# Patient Record
Sex: Male | Born: 1950 | Race: Black or African American | Hispanic: No | Marital: Married | Smoking: Former smoker
Health system: Southern US, Community
[De-identification: ages and names within clinical notes are randomized; demographics above are authoritative.]

## PROBLEM LIST (undated history)

## (undated) DIAGNOSIS — M1990 Unspecified osteoarthritis, unspecified site: Secondary | ICD-10-CM

## (undated) DIAGNOSIS — R200 Anesthesia of skin: Secondary | ICD-10-CM

## (undated) DIAGNOSIS — R32 Unspecified urinary incontinence: Secondary | ICD-10-CM

## (undated) DIAGNOSIS — Z9289 Personal history of other medical treatment: Secondary | ICD-10-CM

## (undated) DIAGNOSIS — E785 Hyperlipidemia, unspecified: Secondary | ICD-10-CM

## (undated) DIAGNOSIS — K219 Gastro-esophageal reflux disease without esophagitis: Secondary | ICD-10-CM

## (undated) DIAGNOSIS — I83009 Varicose veins of unspecified lower extremity with ulcer of unspecified site: Secondary | ICD-10-CM

## (undated) DIAGNOSIS — I872 Venous insufficiency (chronic) (peripheral): Secondary | ICD-10-CM

## (undated) DIAGNOSIS — E876 Hypokalemia: Secondary | ICD-10-CM

## (undated) DIAGNOSIS — K5900 Constipation, unspecified: Secondary | ICD-10-CM

## (undated) DIAGNOSIS — I739 Peripheral vascular disease, unspecified: Secondary | ICD-10-CM

## (undated) DIAGNOSIS — G8929 Other chronic pain: Secondary | ICD-10-CM

## (undated) DIAGNOSIS — G4730 Sleep apnea, unspecified: Secondary | ICD-10-CM

## (undated) DIAGNOSIS — I509 Heart failure, unspecified: Secondary | ICD-10-CM

## (undated) DIAGNOSIS — E1151 Type 2 diabetes mellitus with diabetic peripheral angiopathy without gangrene: Secondary | ICD-10-CM

## (undated) DIAGNOSIS — M1712 Unilateral primary osteoarthritis, left knee: Secondary | ICD-10-CM

## (undated) DIAGNOSIS — I10 Essential (primary) hypertension: Secondary | ICD-10-CM

## (undated) DIAGNOSIS — L97909 Non-pressure chronic ulcer of unspecified part of unspecified lower leg with unspecified severity: Secondary | ICD-10-CM

## (undated) DIAGNOSIS — I5032 Chronic diastolic (congestive) heart failure: Secondary | ICD-10-CM

## (undated) DIAGNOSIS — M5440 Lumbago with sciatica, unspecified side: Secondary | ICD-10-CM

## (undated) DIAGNOSIS — D509 Iron deficiency anemia, unspecified: Secondary | ICD-10-CM

## (undated) DIAGNOSIS — I350 Nonrheumatic aortic (valve) stenosis: Secondary | ICD-10-CM

## (undated) DIAGNOSIS — G4733 Obstructive sleep apnea (adult) (pediatric): Secondary | ICD-10-CM

## (undated) DIAGNOSIS — R202 Paresthesia of skin: Secondary | ICD-10-CM

## (undated) HISTORY — PX: TONSILLECTOMY: SUR1361

## (undated) HISTORY — DX: Non-pressure chronic ulcer of unspecified part of unspecified lower leg with unspecified severity: I83.009

## (undated) HISTORY — DX: Chronic diastolic (congestive) heart failure: I50.32

## (undated) HISTORY — DX: Type 2 diabetes mellitus with diabetic peripheral angiopathy without gangrene: E11.51

## (undated) HISTORY — DX: Constipation, unspecified: K59.00

## (undated) HISTORY — DX: Peripheral vascular disease, unspecified: I73.9

## (undated) HISTORY — DX: Hypokalemia: E87.6

## (undated) HISTORY — DX: Other chronic pain: G89.29

## (undated) HISTORY — DX: Hyperlipidemia, unspecified: E78.5

## (undated) HISTORY — DX: Lumbago with sciatica, unspecified side: M54.40

## (undated) HISTORY — DX: Gastro-esophageal reflux disease without esophagitis: K21.9

## (undated) HISTORY — DX: Venous insufficiency (chronic) (peripheral): I87.2

## (undated) HISTORY — DX: Varicose veins of unspecified lower extremity with ulcer of unspecified site: L97.909

## (undated) HISTORY — DX: Obstructive sleep apnea (adult) (pediatric): G47.33

## (undated) HISTORY — DX: Unilateral primary osteoarthritis, left knee: M17.12

## (undated) HISTORY — PX: VARICOSE VEIN SURGERY: SHX832

## (undated) HISTORY — PX: COLON SURGERY: SHX602

---

## 1997-11-03 ENCOUNTER — Ambulatory Visit (HOSPITAL_COMMUNITY): Admission: RE | Admit: 1997-11-03 | Discharge: 1997-11-03 | Payer: Self-pay | Admitting: Physician Assistant

## 1998-01-25 ENCOUNTER — Ambulatory Visit: Admission: RE | Admit: 1998-01-25 | Discharge: 1998-01-25 | Payer: Self-pay | Admitting: Family Medicine

## 1998-11-22 ENCOUNTER — Ambulatory Visit: Admission: RE | Admit: 1998-11-22 | Discharge: 1998-11-22 | Payer: Self-pay | Admitting: Family Medicine

## 2000-03-14 ENCOUNTER — Ambulatory Visit (HOSPITAL_BASED_OUTPATIENT_CLINIC_OR_DEPARTMENT_OTHER): Admission: RE | Admit: 2000-03-14 | Discharge: 2000-03-14 | Payer: Self-pay | Admitting: Pulmonary Disease

## 2001-03-06 ENCOUNTER — Ambulatory Visit (HOSPITAL_BASED_OUTPATIENT_CLINIC_OR_DEPARTMENT_OTHER): Admission: RE | Admit: 2001-03-06 | Discharge: 2001-03-06 | Payer: Self-pay | Admitting: Pulmonary Disease

## 2001-12-27 ENCOUNTER — Ambulatory Visit (HOSPITAL_COMMUNITY): Admission: RE | Admit: 2001-12-27 | Discharge: 2001-12-27 | Payer: Self-pay | Admitting: Orthopedic Surgery

## 2001-12-27 ENCOUNTER — Encounter: Payer: Self-pay | Admitting: Orthopedic Surgery

## 2002-12-07 ENCOUNTER — Ambulatory Visit (HOSPITAL_COMMUNITY): Admission: RE | Admit: 2002-12-07 | Discharge: 2002-12-07 | Payer: Self-pay | Admitting: Gastroenterology

## 2003-01-31 ENCOUNTER — Encounter: Admission: RE | Admit: 2003-01-31 | Discharge: 2003-05-01 | Payer: Self-pay | Admitting: Family Medicine

## 2004-04-15 ENCOUNTER — Encounter: Admission: RE | Admit: 2004-04-15 | Discharge: 2004-04-15 | Payer: Self-pay | Admitting: Orthopedic Surgery

## 2004-05-01 ENCOUNTER — Encounter: Admission: RE | Admit: 2004-05-01 | Discharge: 2004-05-01 | Payer: Self-pay | Admitting: Orthopedic Surgery

## 2004-05-27 ENCOUNTER — Encounter: Admission: RE | Admit: 2004-05-27 | Discharge: 2004-05-27 | Payer: Self-pay | Admitting: Orthopedic Surgery

## 2004-05-27 ENCOUNTER — Emergency Department (HOSPITAL_COMMUNITY): Admission: EM | Admit: 2004-05-27 | Discharge: 2004-05-27 | Payer: Self-pay | Admitting: Emergency Medicine

## 2005-05-18 HISTORY — PX: COLON RESECTION: SHX5231

## 2005-07-02 HISTORY — PX: TRANSTHORACIC ECHOCARDIOGRAM: SHX275

## 2005-07-05 ENCOUNTER — Inpatient Hospital Stay (HOSPITAL_COMMUNITY): Admission: EM | Admit: 2005-07-05 | Discharge: 2005-07-15 | Payer: Self-pay | Admitting: Emergency Medicine

## 2005-09-10 ENCOUNTER — Encounter (HOSPITAL_BASED_OUTPATIENT_CLINIC_OR_DEPARTMENT_OTHER): Admission: RE | Admit: 2005-09-10 | Discharge: 2005-10-06 | Payer: Self-pay | Admitting: Surgery

## 2006-02-12 ENCOUNTER — Encounter: Admission: RE | Admit: 2006-02-12 | Discharge: 2006-02-12 | Payer: Self-pay | Admitting: Internal Medicine

## 2006-02-19 ENCOUNTER — Encounter: Admission: RE | Admit: 2006-02-19 | Discharge: 2006-02-19 | Payer: Self-pay | Admitting: Internal Medicine

## 2006-03-17 ENCOUNTER — Ambulatory Visit: Payer: Self-pay | Admitting: Gastroenterology

## 2006-03-23 ENCOUNTER — Ambulatory Visit: Payer: Self-pay | Admitting: Gastroenterology

## 2006-05-03 ENCOUNTER — Inpatient Hospital Stay (HOSPITAL_COMMUNITY): Admission: RE | Admit: 2006-05-03 | Discharge: 2006-05-10 | Payer: Self-pay | Admitting: General Surgery

## 2006-05-13 ENCOUNTER — Ambulatory Visit (HOSPITAL_COMMUNITY): Admission: RE | Admit: 2006-05-13 | Discharge: 2006-05-13 | Payer: Self-pay | Admitting: General Surgery

## 2012-01-14 ENCOUNTER — Ambulatory Visit
Admission: RE | Admit: 2012-01-14 | Discharge: 2012-01-14 | Disposition: A | Discharge status: Home / Self Care | Payer: BC Managed Care – PPO | Source: Ambulatory Visit | Attending: Family Medicine | Admitting: Family Medicine

## 2012-01-14 ENCOUNTER — Other Ambulatory Visit: Payer: Self-pay | Admitting: Family Medicine

## 2012-01-14 DIAGNOSIS — M549 Dorsalgia, unspecified: Secondary | ICD-10-CM

## 2012-01-14 MED ORDER — IOHEXOL 180 MG/ML  SOLN: 1.0000 mL | Freq: Once | INTRAMUSCULAR | Status: AC | PRN

## 2012-01-14 MED ORDER — METHYLPREDNISOLONE ACETATE 40 MG/ML INJ SUSP (RADIOLOG: 120.0000 mg | Freq: Once | INTRAMUSCULAR | Status: DC

## 2012-01-19 ENCOUNTER — Other Ambulatory Visit: Payer: Self-pay | Admitting: Family Medicine

## 2012-01-19 DIAGNOSIS — R531 Weakness: Secondary | ICD-10-CM

## 2012-01-19 DIAGNOSIS — M545 Low back pain: Secondary | ICD-10-CM

## 2012-01-19 DIAGNOSIS — R200 Anesthesia of skin: Secondary | ICD-10-CM

## 2012-01-20 ENCOUNTER — Encounter (HOSPITAL_COMMUNITY): Payer: Self-pay

## 2012-01-20 ENCOUNTER — Emergency Department (HOSPITAL_COMMUNITY): Payer: BC Managed Care – PPO

## 2012-01-20 ENCOUNTER — Emergency Department (HOSPITAL_COMMUNITY)
Admission: EM | Admit: 2012-01-20 | Discharge: 2012-01-21 | Disposition: A | Discharge status: Home / Self Care | Payer: BC Managed Care – PPO | Attending: Emergency Medicine | Admitting: Emergency Medicine

## 2012-01-20 ENCOUNTER — Other Ambulatory Visit: Payer: Self-pay | Admitting: Family Medicine

## 2012-01-20 DIAGNOSIS — I10 Essential (primary) hypertension: Secondary | ICD-10-CM | POA: Insufficient documentation

## 2012-01-20 DIAGNOSIS — R531 Weakness: Secondary | ICD-10-CM

## 2012-01-20 DIAGNOSIS — M545 Low back pain, unspecified: Secondary | ICD-10-CM | POA: Insufficient documentation

## 2012-01-20 DIAGNOSIS — M79609 Pain in unspecified limb: Secondary | ICD-10-CM | POA: Insufficient documentation

## 2012-01-20 DIAGNOSIS — E119 Type 2 diabetes mellitus without complications: Secondary | ICD-10-CM | POA: Insufficient documentation

## 2012-01-20 DIAGNOSIS — M4800 Spinal stenosis, site unspecified: Secondary | ICD-10-CM | POA: Insufficient documentation

## 2012-01-20 DIAGNOSIS — R200 Anesthesia of skin: Secondary | ICD-10-CM

## 2012-01-20 DIAGNOSIS — R209 Unspecified disturbances of skin sensation: Secondary | ICD-10-CM | POA: Insufficient documentation

## 2012-01-20 HISTORY — DX: Essential (primary) hypertension: I10

## 2012-01-20 MED ORDER — HYDROMORPHONE HCL PF 1 MG/ML IJ SOLN
1.0000 mg | INTRAMUSCULAR | Status: AC | PRN
  Administered 2012-01-20 – 2012-01-21 (×3): 1 mg via INTRAVENOUS
  Filled 2012-01-20 (×3): qty 1

## 2012-01-20 MED ORDER — ONDANSETRON HCL 4 MG/2ML IJ SOLN
4.0000 mg | Freq: Once | INTRAMUSCULAR | Status: AC | PRN
  Administered 2012-01-20: 4 mg via INTRAVENOUS
  Filled 2012-01-20: qty 2

## 2012-01-20 MED ORDER — HYDROMORPHONE HCL PF 1 MG/ML IJ SOLN: 1.0000 mg | Freq: Once | INTRAMUSCULAR | Status: DC

## 2012-01-20 MED ORDER — SODIUM CHLORIDE 0.9 % IV SOLN
INTRAVENOUS | Status: DC
  Administered 2012-01-20: 16:00:00 via INTRAVENOUS

## 2012-01-20 NOTE — ED Provider Notes (Signed)
History   This chart was scribed for Celene Kras, MD by Charolett Bumpers . The patient was seen in room TR07C/TR07C. Patient's care was started at 1454.     CSN: [id number]  Arrival date & time 01/20/12  1330   First MD Initiated Contact with Patient 01/20/12 1454      Chief Complaint  Patient presents with  . Back Pain    (Consider location/radiation/quality/duration/timing/severity/associated sxs/prior treatment) HPI Mario Mills is a 61 y.o. male who presents to the Emergency Department complaining of constant, moderate lower back with radiation into left thigh down to knee for the past 10 days. Pt reports that he has a h/o back problems approximately 8 years ago but reports the current back pain is new. Pt states that he was seen by chiropractor several times and PCP last week. Pt reports that he followed up with [facility] orthopedics where an MRI was order but has not been preformed.   Past Medical History  Diagnosis Date  . Hypertension   . Diabetes mellitus   . High cholesterol     No past surgical history on file.  No family history on file.  History  Substance Use Topics  . Smoking status: Not on file  . Smokeless tobacco: Not on file  . Alcohol Use: No      Review of Systems  Constitutional: Negative for fever and chills.  Respiratory: Negative for shortness of breath.   Gastrointestinal: Negative for nausea and vomiting.  Musculoskeletal: Positive for back pain.  Neurological: Negative for weakness.  All other systems reviewed and are negative.    Allergies  Review of patient's allergies indicates no known allergies.  Home Medications   Current Outpatient Rx  Name Route Sig Dispense Refill  . CARVEDILOL 12.5 MG PO TABS Oral Take 12.5 mg by mouth 2 (two) times daily with a meal.    . CINNAMON PO Oral Take 1 tablet by mouth 2 (two) times daily.    [name] DIAZEPAM 10 MG PO TABS Oral Take 10 mg by mouth daily as needed. pain    .  DICLOFENAC-MISOPROSTOL 75-0.2 MG PO TBEC Oral Take 1 tablet by mouth daily.    . FUROSEMIDE 40 MG PO TABS Oral Take 40 mg by mouth 2 (two) times daily as needed. For fluid retention    . GLIMEPIRIDE 4 MG PO TABS Oral Take 4 mg by mouth daily before breakfast.    . HYDROMORPHONE HCL 2 MG PO TABS Oral Take 2 mg by mouth every 4 (four) hours as needed. For pain    . LISINOPRIL-HYDROCHLOROTHIAZIDE 20-12.5 MG PO TABS Oral Take 1 tablet by mouth daily.    [name] METFORMIN HCL 1000 MG PO TABS Oral Take 1,000 mg by mouth 2 (two) times daily with a meal.    . ADULT MULTIVITAMIN W/MINERALS CH Oral Take 1 tablet by mouth daily.    [name] FISH OIL PO Oral Take 1 capsule by mouth daily.    [name] POTASSIUM CHLORIDE CRYS ER 20 MEQ PO TBCR Oral Take 20 mEq by mouth 2 (two) times daily as needed. With lasix    . TIZANIDINE HCL 4 MG PO TABS Oral Take 4 mg by mouth every 6 (six) hours as needed. For muscle spasms      BP 190/81  Pulse 92  Temp 97.4 F (36.3 C) (Oral)  Resp 18  SpO2 98%  Physical Exam  Nursing note and vitals reviewed. Constitutional: He appears well-developed and well-nourished. No distress.  HENT:  Head: Normocephalic and atraumatic.  Right Ear: External ear normal.  Left Ear: External ear normal.  Eyes: Conjunctivae are normal. Right eye exhibits no discharge. Left eye exhibits no discharge. No scleral icterus.  Neck: Neck supple. No tracheal deviation present.  Cardiovascular: Normal rate.   Pulmonary/Chest: Effort normal. No stridor. No respiratory distress.  Abdominal: Soft. He exhibits no distension. There is no tenderness.  Musculoskeletal: He exhibits tenderness. He exhibits no edema.       Perispinal lumbar tenderness. Slightly altered plantar/dorsi flexion on the left as compared to the right. Subjective altered sensation of the left leg as compared to the right.   Neurological: He is alert. Cranial nerve deficit: no gross deficits.  Skin: Skin is warm and dry. No rash noted.    Psychiatric: He has a normal mood and affect.    ED Course  Procedures (including critical care time)  DIAGNOSTIC STUDIES: Oxygen Saturation is 98% on room air, normal by my interpretation.    COORDINATION OF CARE:  15:08-Discussed planned course of treatment with the patient including pain medication, who is agreeable at this time. MRI has been previously order, will contact radiology to see what needs to be done.   15:14-Medication Orders: Hydromorphone (Dilaudid) injection 1 mg-every 30 min PRN; Ondansetron (Zofran) injection 4 mg-once; 0.9% sodium chloride infusion-continuous.   Labs Reviewed - No data to display No results found.   No diagnosis found.    MDM  Pt's symptoms suggestive of radiculopathy.  His orthopedist wanted him to get an MRI.  Pt does not think he can wait.  Will order MRI while here in the ED  I personally performed the services described in this documentation, which was scribed in my presence.  The recorded information has been reviewed and considered.        Celene Kras, MD 01/20/12 2012

## 2012-01-20 NOTE — ED Notes (Signed)
Call to MRI, told patient on the list

## 2012-01-20 NOTE — ED Notes (Signed)
Patient transported to MRI 

## 2012-01-20 NOTE — ED Notes (Signed)
Patient returned from MRI.

## 2012-01-20 NOTE — ED Notes (Signed)
Back apin for approx 10 days, sts 2 sundays ago, onset after mowing lawn, pt reports seeing md for same and given epidural, diaudid, demerol, and mulitple treatments, none of which are working, pt sts we need an mri next.

## 2012-01-20 NOTE — ED Notes (Signed)
Pt given crackers and a diet sprite

## 2012-01-20 NOTE — ED Notes (Addendum)
Pt reports lower back pain that shoot down left leg to knee for 10 days.  Pt states pain started after mowing lawn.  Pt reports pain is eased temporarily by lying on left side.  Pt has seen md for same and has been taking dilaudid and muscle relax as rx without relief.  Pt states he was told he needed MRIPt alert oriented X4.

## 2012-01-20 NOTE — ED Provider Notes (Signed)
Patient is a 61 year old with a history of hypertension diabetes that presents emergency department complaining of left-sided radicular back pain.  Pain currently being managed in the emergency department with Dilaudid. Pt re-evaluated & on exam: hemodynamically stable, NAD, heart w/ RRR, lungs CTAB, Chest & abd non-tender, No weakness or focal neuro deficits on lower extremity exam.  MRI reviewed and listed below showing severe spinal stenosis at L3-4, L4-5.  Neurosurgery has been consult made to discuss outpatient followup scheduling.  Patient will be discharged with pain medication & advice to call Dr. Cassandria Santee office today to be seen.  MRI IMPRESSION 01/20/2012: Multilevel lumbar disc and facet degeneration. Moderate to severe spinal stenosis at L1-2 and L2-3. Severe spinal stenosis L3-4 and L4-5.  Left lateral disc protrusion L2-3.  Left small paracentral disc protrusion L5-S1 which could cause a left S1 radiculopathy.      Jaci Carrel, [place] 01/21/12 [phone]

## 2012-01-21 ENCOUNTER — Inpatient Hospital Stay
Admission: RE | Admit: 2012-01-21 | Discharge: 2012-01-21 | Payer: BC Managed Care – PPO | Source: Ambulatory Visit | Attending: Family Medicine | Admitting: Family Medicine

## 2012-01-21 ENCOUNTER — Other Ambulatory Visit: Payer: BC Managed Care – PPO

## 2012-01-21 MED ORDER — HYDROMORPHONE HCL 2 MG PO TABS: 2.0000 mg | ORAL_TABLET | ORAL | Status: AC | PRN

## 2012-01-21 NOTE — ED Provider Notes (Signed)
Medical screening examination/treatment/procedure(s) were performed by non-physician practitioner and as supervising physician I was immediately available for consultation/collaboration.  Jones Skene, M.D.     Jones Skene, MD 01/21/12 1243

## 2012-07-25 ENCOUNTER — Other Ambulatory Visit (HOSPITAL_COMMUNITY): Payer: Self-pay | Admitting: Cardiovascular Disease

## 2012-07-25 DIAGNOSIS — R0989 Other specified symptoms and signs involving the circulatory and respiratory systems: Secondary | ICD-10-CM

## 2012-08-01 ENCOUNTER — Encounter (HOSPITAL_COMMUNITY): Payer: BC Managed Care – PPO

## 2012-08-03 ENCOUNTER — Ambulatory Visit (HOSPITAL_COMMUNITY)
Admission: RE | Admit: 2012-08-03 | Discharge: 2012-08-03 | Disposition: A | Discharge status: Home / Self Care | Payer: No Typology Code available for payment source | Source: Ambulatory Visit | Attending: Cardiovascular Disease | Admitting: Cardiovascular Disease

## 2012-08-03 DIAGNOSIS — R0989 Other specified symptoms and signs involving the circulatory and respiratory systems: Secondary | ICD-10-CM | POA: Insufficient documentation

## 2012-08-03 NOTE — Progress Notes (Signed)
Carotid Duplex Complete Mario Mills 

## 2012-12-09 ENCOUNTER — Encounter (HOSPITAL_BASED_OUTPATIENT_CLINIC_OR_DEPARTMENT_OTHER): Payer: No Typology Code available for payment source | Attending: General Surgery

## 2012-12-09 DIAGNOSIS — E669 Obesity, unspecified: Secondary | ICD-10-CM | POA: Insufficient documentation

## 2012-12-09 DIAGNOSIS — I87319 Chronic venous hypertension (idiopathic) with ulcer of unspecified lower extremity: Secondary | ICD-10-CM | POA: Insufficient documentation

## 2012-12-09 DIAGNOSIS — E119 Type 2 diabetes mellitus without complications: Secondary | ICD-10-CM | POA: Insufficient documentation

## 2012-12-09 DIAGNOSIS — I10 Essential (primary) hypertension: Secondary | ICD-10-CM | POA: Insufficient documentation

## 2012-12-09 DIAGNOSIS — Z79899 Other long term (current) drug therapy: Secondary | ICD-10-CM | POA: Insufficient documentation

## 2012-12-09 DIAGNOSIS — L97909 Non-pressure chronic ulcer of unspecified part of unspecified lower leg with unspecified severity: Secondary | ICD-10-CM | POA: Insufficient documentation

## 2012-12-09 DIAGNOSIS — I872 Venous insufficiency (chronic) (peripheral): Secondary | ICD-10-CM | POA: Insufficient documentation

## 2012-12-09 NOTE — Progress Notes (Signed)
[facility]  NAME:  Mario Mills, Mario Mills             ACCOUNT NO.:  [id number]  MEDICAL RECORD NO.:  [id number]      DATE OF BIRTH:  05-09-1951  PHYSICIAN:  Ardath Sax, M.D.      VISIT DATE:  12/09/2012                                  OFFICE VISIT   SUBJECTIVE:  This is a 62 year old, African American male, who is a type 2 diabetic. who will also has hypertension.  He comes to [place] with venostasis ulcers on both of his legs.  Both of his legs have marked changes of venous hypertension, and he has chronic venous ulcers on both of his legs.  He has got secondary skin changes.  He has already had a saphenous vein stripping on the right in the past.  He weighs 330 pounds.  His temperature 98, pulse 62, respiration 16, blood pressure 178/71.  He is on many medicines including metformin, lisinopril, carvedilol, K- Lor, Lasix, simvastatin, Dilaudid, glipizide, and Xanax.  OBJECTIVE:  On examination, his legs have secondary changes with the bronze coloring of venostasis.  He has got several 1 cm ulcers on the anterior and lateral aspect of both of his legs.  The skin is very edematous and tight.  ASSESSMENT/PLAN:  We got a 0.8-ABI, so we elected just to put Profore Lite with Silvercel and zinc, and we ordered vascular studies with Dr. Allyson Sabal.  So, his diagnosis is venous hypertension, chronic venous ulcers in bilateral legs, obesity, hypertension, and type 2 diabetes.  We will see him back here in a week after his vascular studies.     Ardath Sax, M.D.     PP/MEDQ  D:  12/09/2012  T:  12/09/2012  Job:  [id number]

## 2012-12-14 ENCOUNTER — Other Ambulatory Visit (HOSPITAL_COMMUNITY): Payer: Self-pay | Admitting: General Surgery

## 2012-12-14 DIAGNOSIS — I872 Venous insufficiency (chronic) (peripheral): Secondary | ICD-10-CM

## 2012-12-14 DIAGNOSIS — I739 Peripheral vascular disease, unspecified: Secondary | ICD-10-CM

## 2012-12-16 ENCOUNTER — Encounter (HOSPITAL_COMMUNITY): Payer: No Typology Code available for payment source

## 2012-12-16 ENCOUNTER — Ambulatory Visit (HOSPITAL_BASED_OUTPATIENT_CLINIC_OR_DEPARTMENT_OTHER)
Admission: RE | Admit: 2012-12-16 | Discharge: 2012-12-16 | Disposition: A | Discharge status: Home / Self Care | Payer: No Typology Code available for payment source | Source: Ambulatory Visit | Attending: General Surgery | Admitting: General Surgery

## 2012-12-16 ENCOUNTER — Ambulatory Visit (HOSPITAL_COMMUNITY)
Admission: RE | Admit: 2012-12-16 | Discharge: 2012-12-16 | Disposition: A | Discharge status: Home / Self Care | Payer: No Typology Code available for payment source | Source: Ambulatory Visit | Attending: General Surgery | Admitting: General Surgery

## 2012-12-16 ENCOUNTER — Encounter (HOSPITAL_BASED_OUTPATIENT_CLINIC_OR_DEPARTMENT_OTHER): Payer: No Typology Code available for payment source | Attending: General Surgery

## 2012-12-16 DIAGNOSIS — I872 Venous insufficiency (chronic) (peripheral): Secondary | ICD-10-CM

## 2012-12-16 DIAGNOSIS — I739 Peripheral vascular disease, unspecified: Secondary | ICD-10-CM

## 2012-12-16 DIAGNOSIS — L97809 Non-pressure chronic ulcer of other part of unspecified lower leg with unspecified severity: Secondary | ICD-10-CM | POA: Insufficient documentation

## 2012-12-16 NOTE — Progress Notes (Signed)
Lower Extremity Venous Duplex Completed. °Mario Mills ° °

## 2012-12-16 NOTE — Progress Notes (Signed)
Lower Extremity Arterial Duplex Completed. °Mario Mills ° °

## 2012-12-22 ENCOUNTER — Ambulatory Visit (INDEPENDENT_AMBULATORY_CARE_PROVIDER_SITE_OTHER): Payer: No Typology Code available for payment source | Admitting: Cardiovascular Disease

## 2012-12-22 ENCOUNTER — Encounter: Payer: Self-pay | Admitting: Cardiovascular Disease

## 2012-12-22 VITALS — BP 164/82 | HR 78 | Ht 73.0 in | Wt 334.0 lb

## 2012-12-22 DIAGNOSIS — I10 Essential (primary) hypertension: Secondary | ICD-10-CM

## 2012-12-22 DIAGNOSIS — G4733 Obstructive sleep apnea (adult) (pediatric): Secondary | ICD-10-CM | POA: Insufficient documentation

## 2012-12-22 DIAGNOSIS — E119 Type 2 diabetes mellitus without complications: Secondary | ICD-10-CM

## 2012-12-22 DIAGNOSIS — I739 Peripheral vascular disease, unspecified: Secondary | ICD-10-CM

## 2012-12-22 DIAGNOSIS — I872 Venous insufficiency (chronic) (peripheral): Secondary | ICD-10-CM

## 2012-12-22 DIAGNOSIS — I878 Other specified disorders of veins: Secondary | ICD-10-CM

## 2012-12-22 DIAGNOSIS — E785 Hyperlipidemia, unspecified: Secondary | ICD-10-CM

## 2012-12-22 NOTE — Assessment & Plan Note (Signed)
Lower extremity arterial Dopplers revealed a left ABI 0.95 the left of 0.73. He did have a high-frequency signal in the origin of the left SFA.it is hard to elicit symptoms of claudication since he says he has discomfort in his legs bilaterally.. I will follow him up again in 6 months

## 2012-12-22 NOTE — Assessment & Plan Note (Signed)
Patient has had vein stripping on the right with a long history of venous stasis bilaterally. He's had MRSA on his right leg. He is a poorly healing wound on his left pretibial area followed by the wound care center (Dr. Ardath Sax). He's had this wrapped and has had compression stockings in the past. Venous Dopplers performed in our office to confirm this reflux. I am going to refer him to Dr. [place] [name] for further evaluation and potential endovascular ablation

## 2012-12-22 NOTE — Patient Instructions (Addendum)
You will be set up to see Dr Rennis Golden next week  Follow up with Dr Allyson Sabal in 6 months

## 2012-12-22 NOTE — Progress Notes (Signed)
12/22/2012 Mario Mills   Mar 08, 1951  [id number]  Primary Physician Mario German, MD Primary Cardiologist: Mario Gess MD Mario Mills   HPI:  The patient is a 62 year old, severely overweight, married [place] American male with a history of hypertension, hyperlipidemia, noninsulin-requiring diabetes and obstructive sleep apnea on CPAP. He stopped smoking 7 years ago. I saw him last March 04, 2007. He successfully underwent colon resection after a normal 2D echo and Cardiolite stress test at that time. He is otherwise asymptomatic except for spinal stenosis. Recent lab work performed by Dr. Concepcion Mills revealed a total cholesterol of 256, LDL of 190 and HDL of 37.I saw him in the office 5 months ago. He's been stable since except for a poorly healing left pretibial venous stasis ulcer thought that Dr. Ardath Mills at the [facility] wound care clinic. Arterial and venous Dopplers were obtained our office on 12/16/12 revealing a right ankle regular neck supple 95 and a left of 0.73 with a high-frequency signal in his left SFA though it is hard to elicit a clear history of claudication. Venous Dopplers to suggest venous reflux on the left. He's had venous stripping on the right with MRSA    Current Outpatient Prescriptions  Medication Sig Dispense Refill  . carvedilol (COREG) 12.5 MG tablet Take 12.5 mg by mouth 2 (two) times daily with a meal.      . CINNAMON PO Take 1 tablet by mouth 2 (two) times daily. 1-2 times a day      . diazepam (VALIUM) 10 MG tablet Take 10 mg by mouth daily as needed. pain      . Diclofenac-Misoprostol 75-0.2 MG TBEC Take 1 tablet by mouth 2 (two) times daily.       [name] DILTIAZEM HCL CD 360 MG 24 hr capsule 360 mg daily.      . furosemide (LASIX) 40 MG tablet Take 40 mg by mouth daily. AS NEEDED For fluid retention      . glimepiride (AMARYL) 4 MG tablet Take 4 mg by mouth daily before breakfast.      . HYDROmorphone (DILAUDID) 2 MG tablet Take 2 mg by  mouth every 4 (four) hours as needed. For pain      . LANTUS SOLOSTAR 100 UNIT/ML SOPN daily.      [name] lisinopril-hydrochlorothiazide (PRINZIDE,ZESTORETIC) 20-12.5 MG per tablet Take 1 tablet by mouth daily.      . metFORMIN (GLUCOPHAGE) 1000 MG tablet Take 1,000 mg by mouth 2 (two) times daily with a meal.      . Multiple Vitamin (MULTIVITAMIN WITH MINERALS) TABS Take 1 tablet by mouth daily.      . Omega-3 Fatty Acids (FISH OIL PO) Take 1 capsule by mouth daily.      . potassium chloride SA (K-DUR,KLOR-CON) 20 MEQ tablet Take 20 mEq by mouth 2 (two) times daily as needed. With lasix      . simvastatin (ZOCOR) 40 MG tablet 40 mg daily.       No current facility-administered medications for this visit.    No Known Allergies  History   Social History  . Marital Status: Married    Spouse Name: N/A    Number of Children: N/A  . Years of Education: N/A   Occupational History  . Not on file.   Social History Main Topics  . Smoking status: Former Smoker -- 1.00 packs/day for 35 years    Types: Cigarettes  . Smokeless tobacco: Never Used  . Alcohol Use: 0.0  oz/week    0.5 drink(s) per week  . Drug Use: No  . Sexually Active: Not on file   Other Topics Concern  . Not on file   Social History Narrative  . No narrative on file     Review of Systems: General: negative for chills, fever, night sweats or weight changes.  Cardiovascular: negative for chest pain, dyspnea on exertion, edema, orthopnea, palpitations, paroxysmal nocturnal dyspnea or shortness of breath Dermatological: negative for rash Respiratory: negative for cough or wheezing Urologic: negative for hematuria Abdominal: negative for nausea, vomiting, diarrhea, bright red blood per rectum, melena, or hematemesis Neurologic: negative for visual changes, syncope, or dizziness All other systems reviewed and are otherwise negative except as noted above.    Blood pressure 164/82, pulse 78, height 6\' 1"  (1.854 m), weight  334 lb (151.501 kg).  General appearance: alert and no distress Neck: no adenopathy, no carotid bruit, no JVD, supple, symmetrical, trachea midline and thyroid not enlarged, symmetric, no tenderness/mass/nodules Lungs: clear to auscultation bilaterally Heart: regular rate and rhythm, S1, S2 normal, no murmur, click, rub or gallop Extremities: venous stasis dermatitis noted and poorly healing venostasis ulcer left pretibial area  EKG not performed today  ASSESSMENT AND PLAN:   Venous stasis Patient has had vein stripping on the right with a long history of venous stasis bilaterally. He's had MRSA on his right leg. He is a poorly healing wound on his left pretibial area followed by the wound care center (Dr. Ardath Mills). He's had this wrapped and has had compression stockings in the past. Venous Dopplers performed in our office to confirm this reflux. I am going to refer him to Dr. Italy Mills for further evaluation and potential endovascular ablation  Peripheral arterial disease Lower extremity arterial Dopplers revealed a left ABI 0.95 the left of 0.73. He did have a high-frequency signal in the origin of the left SFA.it is hard to elicit symptoms of claudication since he says he has discomfort in his legs bilaterally.. I will follow him up again in 6 months      Mario Gess MD [facility], [facility] 12/22/2012 12:42 PM

## 2012-12-24 ENCOUNTER — Encounter: Payer: Self-pay | Admitting: *Deleted

## 2012-12-26 ENCOUNTER — Encounter: Payer: Self-pay | Admitting: Cardiovascular Disease

## 2012-12-27 ENCOUNTER — Ambulatory Visit (INDEPENDENT_AMBULATORY_CARE_PROVIDER_SITE_OTHER): Payer: No Typology Code available for payment source | Admitting: Internal Medicine

## 2012-12-27 ENCOUNTER — Encounter: Payer: Self-pay | Admitting: Internal Medicine

## 2012-12-27 VITALS — BP 154/84 | HR 72 | Ht 73.0 in | Wt 335.3 lb

## 2012-12-27 DIAGNOSIS — I878 Other specified disorders of veins: Secondary | ICD-10-CM

## 2012-12-27 DIAGNOSIS — I872 Venous insufficiency (chronic) (peripheral): Secondary | ICD-10-CM

## 2012-12-27 NOTE — Progress Notes (Signed)
OFFICE NOTE  Chief Complaint:  Poorly healing wounds  Primary Care Physician: Mario Mills German, MD.   HPI:  Mario Mills Mills is a pleasant 62 year old morbidly obese African American male with a history of hypertension, dyslipidemia, known insulin-dependent diabetes and obstructive sleep apnea CPAP. He is a patient of Mario Mills Mills and was recently seen in the wound Center for poorly healing wounds on the anterior shins. He was referred for possible arterial insufficiency. He underwent arterial Dopplers which did show decreased ABIs of 0.95 on the right and 0.73 on the left. There seemed to be increased velocities in the left SFA.  He did not feel that arterial intervention was necessary as the patient cannot have lifestyle limiting claudication. He was concerned about venous insufficiency and asked me to see him regarding possibly poor healing venous ulcers.  PMHx:  Past Medical History  Diagnosis Date  . Hypertension   . Diabetes mellitus   . Hyperlipidemia   . Obstructive sleep apnea     CPAP  . Peripheral arterial disease   . Venous insufficiency   . Venous stasis ulcer   . DOE (dyspnea on exertion)     WITH MINIMAL EXERTION  . HA (headache)     WAKE UP FREQUENTLY WITH HA  . History of Doppler ultrasound 12/2012    LE VENOUS REFLUX demostrated on L (vein stripping on R w/MRSA); h/o venous stasis bilat     Past Surgical History  Procedure Laterality Date  . Colon resection  2007  . Transthoracic echocardiogram  07/02/2005    mod concentric LVH; LV borderline dilated; LA mild-mod dilated; mild MR & TR  . Varicose vein surgery      R leg    FAMHx:  Family History  Problem Relation Age of Onset  . Pancreatic cancer Father   . Heart Problems Mother   . Hypertension Mother   . Diabetes Brother     SOCHx:   reports that he quit smoking about 7 years ago. His smoking use included Cigarettes. He has a 35 pack-year smoking history. He has never used smokeless tobacco. He  reports that  drinks alcohol. He reports that he does not use illicit drugs.  ALLERGIES:  No Known Allergies  ROS: A comprehensive review of systems was negative except for: Constitutional: positive for fatigue Cardiovascular: positive for lower extremity edema Integument/breast: positive for skin color change  HOME MEDS: Current Outpatient Prescriptions  Medication Sig Dispense Refill  . carvedilol (COREG) 12.5 MG tablet Take 12.5 mg by mouth 2 (two) times daily with a meal.      . CINNAMON PO Take 1 tablet by mouth 2 (two) times daily. 1-2 times a day      . Coenzyme Q10 (CO Q 10) 100 MG CAPS Take 1 capsule by mouth daily.      . diazepam (VALIUM) 10 MG tablet Take 10 mg by mouth daily as needed. pain      . Diclofenac-Misoprostol 75-0.2 MG TBEC Take 1 tablet by mouth 2 (two) times daily.       [name] DILTIAZEM HCL CD 360 MG 24 hr capsule Take 360 mg by mouth daily.       . furosemide (LASIX) 40 MG tablet Take 40 mg by mouth daily. AS NEEDED For fluid retention      . glimepiride (AMARYL) 4 MG tablet Take 4 mg by mouth daily before breakfast.      . HYDROmorphone (DILAUDID) 2 MG tablet Take 2 mg by mouth every 4 (  four) hours as needed. For pain      . LANTUS SOLOSTAR 100 UNIT/ML SOPN Inject 20 Units as directed at bedtime.       [name] lisinopril-hydrochlorothiazide (PRINZIDE,ZESTORETIC) 20-12.5 MG per tablet Take 1 tablet by mouth daily.      . Multiple Vitamin (MULTIVITAMIN WITH MINERALS) TABS Take 1 tablet by mouth daily.      . Omega-3 Fatty Acids (FISH OIL PO) Take 1 capsule by mouth daily.      . potassium chloride SA (K-DUR,KLOR-CON) 20 MEQ tablet Take 20 mEq by mouth 2 (two) times daily as needed. With lasix      . simvastatin (ZOCOR) 40 MG tablet Take 40 mg by mouth at bedtime.        No current facility-administered medications for this visit.    LABS/IMAGING: No results found for this or any previous visit (from the past 48 hour(s)). No results found.  VITALS: BP 154/84   Pulse 72  Ht 6\' 1"  (1.854 m)  Wt 335 lb 4.8 oz (152.091 kg)  BMI 44.25 kg/m2  EXAM: Focused physical exam alert extremities demonstrated areas of denidation over the anterior shins bilaterally and areas of bullae which appear to be blisters. There is edema and hemosiderin deposition of the ankles with some small reticular veins but no notable varicose veins. This is consistent with CEAP 1,2,3,4a disease  EKG: deferred  ASSESSMENT: 1. CEAP 1,2,3,4a bilateral venous disease 2. Non-healing sores, not typical of venous ulcers  PLAN: 1.   [name] has bilateral nonhealing sores on the anterior shins which are not typical for venous ulcers. They're not necessarily in the location one would expect venous ulceration. [name] is venous Dopplers demonstrate a superficial tortuous vessel in the right greater saphenous vein which is a remnant from prior lobectomy. There are no significant refluxing veins in the right leg and a very small area of reflux up at the junction of the left greater saphenous vein. This anatomy is not amenable to venous ablation. I'm doubtful that any venous ablation will actually change his lesions. He may need increased diuretics and continued wound care.  Thank you for allow me to evaluate him.  Chrystie Nose, MD, [facility] Attending Cardiologist [facility]  [name],[name] 12/27/2012, 3:44 PM

## 2012-12-27 NOTE — Patient Instructions (Addendum)
Follow up with Dr.[name] as needed. 

## 2012-12-30 LAB — GLUCOSE, CAPILLARY

## 2013-01-20 ENCOUNTER — Encounter (HOSPITAL_BASED_OUTPATIENT_CLINIC_OR_DEPARTMENT_OTHER): Payer: No Typology Code available for payment source | Attending: General Surgery

## 2013-01-20 DIAGNOSIS — L97909 Non-pressure chronic ulcer of unspecified part of unspecified lower leg with unspecified severity: Secondary | ICD-10-CM | POA: Insufficient documentation

## 2013-01-20 DIAGNOSIS — I872 Venous insufficiency (chronic) (peripheral): Secondary | ICD-10-CM | POA: Insufficient documentation

## 2013-01-20 DIAGNOSIS — I87319 Chronic venous hypertension (idiopathic) with ulcer of unspecified lower extremity: Secondary | ICD-10-CM | POA: Insufficient documentation

## 2014-02-14 ENCOUNTER — Encounter (HOSPITAL_BASED_OUTPATIENT_CLINIC_OR_DEPARTMENT_OTHER): Payer: No Typology Code available for payment source | Attending: General Surgery

## 2014-02-14 DIAGNOSIS — I872 Venous insufficiency (chronic) (peripheral): Secondary | ICD-10-CM | POA: Diagnosis not present

## 2014-02-14 DIAGNOSIS — I739 Peripheral vascular disease, unspecified: Secondary | ICD-10-CM | POA: Diagnosis not present

## 2014-02-14 DIAGNOSIS — L97909 Non-pressure chronic ulcer of unspecified part of unspecified lower leg with unspecified severity: Secondary | ICD-10-CM | POA: Diagnosis not present

## 2014-02-14 LAB — GLUCOSE, CAPILLARY: Glucose-Capillary: 151 mg/dL — ABNORMAL HIGH (ref 70–99)

## 2014-02-15 NOTE — Progress Notes (Signed)
[facility]  NAME:  Mario, Mills             ACCOUNT NO.:  [id number]  MEDICAL RECORD NO.:  [id number]      DATE OF BIRTH:  08-06-1950  PHYSICIAN:  Judene Companion, M.D.           VISIT DATE:                                  OFFICE VISIT   [name] is a 63 year old gentleman who returns to [place] with a history of venous ulcers.  He also has a history of peripheral vascular disease. He has had a vein stripping on the right.  He is a patient who about a year ago was here with venous ulcers on both of his legs which we healed up with Apligraf.  He has a history of type 2 diabetes and hypertension. He has also had a colon operation for diverticulitis.  He has a history of peripheral vascular disease and according to the vascular workup apparently there is not any areas where they feel they can do him any good with either angioplasties or stents.  His medicines are Valium, Lasix, Amaryl, Dilaudid, Prinzide, vitamins, fish oil, K-Dur, Zocor, and Lantus insulin.  When he was checked in today, he was found to have a blood pressure of 158/59, pulse 56, temperature 98.  He is quite obese, weighing 350 pounds.  The venous ulcers that he have on the right anterior leg and they were 4 x 2 cm; and on the left leg, they were 0.9 x 0.6.  We treated him with compression and silver collagen and we will have him return in a week.  So, his diagnosis is venous stasis ulcers, bilateral, history of peripheral vascular disease, history of hypertension, morbid obesity, and type 2 diabetes.     Judene Companion, M.D.     PP/MEDQ  D:  02/14/2014  T:  02/15/2014  Job:  [id number]

## 2014-02-21 ENCOUNTER — Encounter (HOSPITAL_BASED_OUTPATIENT_CLINIC_OR_DEPARTMENT_OTHER): Payer: No Typology Code available for payment source | Attending: General Surgery

## 2014-02-21 DIAGNOSIS — L97929 Non-pressure chronic ulcer of unspecified part of left lower leg with unspecified severity: Secondary | ICD-10-CM | POA: Insufficient documentation

## 2014-02-21 DIAGNOSIS — L97919 Non-pressure chronic ulcer of unspecified part of right lower leg with unspecified severity: Secondary | ICD-10-CM | POA: Insufficient documentation

## 2014-02-21 DIAGNOSIS — I87333 Chronic venous hypertension (idiopathic) with ulcer and inflammation of bilateral lower extremity: Secondary | ICD-10-CM | POA: Insufficient documentation

## 2014-02-28 DIAGNOSIS — I87333 Chronic venous hypertension (idiopathic) with ulcer and inflammation of bilateral lower extremity: Secondary | ICD-10-CM | POA: Diagnosis not present

## 2014-03-08 ENCOUNTER — Ambulatory Visit (HOSPITAL_COMMUNITY)
Admission: RE | Admit: 2014-03-08 | Discharge: 2014-03-08 | Disposition: A | Discharge status: Home / Self Care | Payer: No Typology Code available for payment source | Source: Ambulatory Visit | Attending: Vascular Surgery | Admitting: Vascular Surgery

## 2014-03-08 ENCOUNTER — Other Ambulatory Visit (HOSPITAL_COMMUNITY): Payer: Self-pay | Admitting: General Surgery

## 2014-03-08 DIAGNOSIS — L98499 Non-pressure chronic ulcer of skin of other sites with unspecified severity: Secondary | ICD-10-CM | POA: Diagnosis present

## 2014-03-08 DIAGNOSIS — R609 Edema, unspecified: Secondary | ICD-10-CM

## 2014-03-08 DIAGNOSIS — L97929 Non-pressure chronic ulcer of unspecified part of left lower leg with unspecified severity: Secondary | ICD-10-CM | POA: Diagnosis not present

## 2014-03-08 DIAGNOSIS — L97919 Non-pressure chronic ulcer of unspecified part of right lower leg with unspecified severity: Secondary | ICD-10-CM | POA: Insufficient documentation

## 2014-03-08 DIAGNOSIS — I872 Venous insufficiency (chronic) (peripheral): Secondary | ICD-10-CM | POA: Insufficient documentation

## 2014-03-09 DIAGNOSIS — I87333 Chronic venous hypertension (idiopathic) with ulcer and inflammation of bilateral lower extremity: Secondary | ICD-10-CM | POA: Diagnosis not present

## 2014-03-16 DIAGNOSIS — I87333 Chronic venous hypertension (idiopathic) with ulcer and inflammation of bilateral lower extremity: Secondary | ICD-10-CM | POA: Diagnosis not present

## 2014-03-23 ENCOUNTER — Encounter (HOSPITAL_BASED_OUTPATIENT_CLINIC_OR_DEPARTMENT_OTHER): Payer: No Typology Code available for payment source | Attending: General Surgery

## 2014-03-23 DIAGNOSIS — L97929 Non-pressure chronic ulcer of unspecified part of left lower leg with unspecified severity: Secondary | ICD-10-CM | POA: Insufficient documentation

## 2014-03-23 DIAGNOSIS — L97919 Non-pressure chronic ulcer of unspecified part of right lower leg with unspecified severity: Secondary | ICD-10-CM | POA: Insufficient documentation

## 2014-03-23 DIAGNOSIS — I87333 Chronic venous hypertension (idiopathic) with ulcer and inflammation of bilateral lower extremity: Secondary | ICD-10-CM | POA: Diagnosis not present

## 2014-03-27 ENCOUNTER — Encounter: Payer: Self-pay | Admitting: Vascular Surgery

## 2014-03-28 ENCOUNTER — Ambulatory Visit (INDEPENDENT_AMBULATORY_CARE_PROVIDER_SITE_OTHER): Payer: No Typology Code available for payment source | Admitting: Vascular Surgery

## 2014-03-28 ENCOUNTER — Encounter: Payer: Self-pay | Admitting: Vascular Surgery

## 2014-03-28 VITALS — BP 176/72 | HR 63 | Ht 73.0 in | Wt 328.9 lb

## 2014-03-28 DIAGNOSIS — I872 Venous insufficiency (chronic) (peripheral): Secondary | ICD-10-CM | POA: Insufficient documentation

## 2014-03-28 DIAGNOSIS — I83019 Varicose veins of right lower extremity with ulcer of unspecified site: Secondary | ICD-10-CM

## 2014-03-28 DIAGNOSIS — L97929 Non-pressure chronic ulcer of unspecified part of left lower leg with unspecified severity: Principal | ICD-10-CM

## 2014-03-28 DIAGNOSIS — L97919 Non-pressure chronic ulcer of unspecified part of right lower leg with unspecified severity: Principal | ICD-10-CM

## 2014-03-28 DIAGNOSIS — I83029 Varicose veins of left lower extremity with ulcer of unspecified site: Principal | ICD-10-CM

## 2014-03-28 NOTE — Progress Notes (Signed)
Referred by:  Philis Fendt, MD [address] [place], [place] [address]  Reason for referral: Swollen B leg  History of Present Illness  Mario Mills is a 63 y.o. (06/08/1950) male who presents with chief complaint: swollen B leg.  Patient notes, onset of swelling years ago.  The patient's symptoms include: venous ulcers in both leg treated with unna boots.  The patient's move recent L calf ulcer was treated with such and resolved within one month.  He has had ulcers on the right leg also.   The patient has had no history of DVT, known history of varicose vein, known history of venous stasis ulcers, no history of  Lymphedema and known history of skin changes in lower legs.  There is known family history of venous disorders.  The patient has used compression stockings in the past.  Past Medical History  Diagnosis Date  . Hypertension   . Diabetes mellitus   . Hyperlipidemia   . Obstructive sleep apnea     CPAP  . Peripheral arterial disease   . Venous insufficiency   . Venous stasis ulcer   . DOE (dyspnea on exertion)     WITH MINIMAL EXERTION  . HA (headache)     WAKE UP FREQUENTLY WITH HA  . History of Doppler ultrasound 12/2012    LE VENOUS REFLUX demostrated on L (vein stripping on R w/MRSA); h/o venous stasis bilat   . Atrial fibrillation     Past Surgical History  Procedure Laterality Date  . Colon resection  2007  . Transthoracic echocardiogram  07/02/2005    mod concentric LVH; LV borderline dilated; LA mild-mod dilated; mild MR & TR  . Varicose vein surgery      R leg    History   Social History  . Marital Status: Married    Spouse Name: N/A    Number of Children: 2  . Years of Education: post grad   Occupational History  . 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 Smoking status: Former Smoker -- 1.00 packs/day for 35 years    Types: Cigarettes    Quit date: 07/25/2005  . Smokeless tobacco: Never Used    . Alcohol Use: 0.0 oz/week    0.5 Not specified per week  . Drug Use: No  . Sexual Activity: Not on file   Other Topics Concern  . Not on file   Social History Narrative    Family History  Problem Relation Age of Onset  . Pancreatic cancer Father   . Cancer Father   . Heart Problems Mother   . Hypertension Mother   . Diabetes Brother   . Hypertension Brother     Current Outpatient Prescriptions on File Prior to Visit  Medication Sig Dispense Refill  . carvedilol (COREG) 12.5 MG tablet Take 12.5 mg by mouth 2 (two) times daily with a meal.    . CINNAMON PO Take 1 tablet by mouth 2 (two) times daily. 1-2 times a day    . Coenzyme Q10 (CO Q 10) 100 MG CAPS Take 1 capsule by mouth daily.    . diazepam (VALIUM) 10 MG tablet Take 10 mg by mouth daily as needed. pain    . Diclofenac-Misoprostol 75-0.2 MG TBEC Take 1 tablet by mouth 2 (two) times daily.     [name] DILTIAZEM HCL CD 360 MG 24 hr capsule Take 360 mg by mouth daily.     [name]  furosemide (LASIX) 40 MG tablet Take 40 mg by mouth daily. AS NEEDED For fluid retention    . glimepiride (AMARYL) 4 MG tablet Take 4 mg by mouth daily before breakfast.    . HYDROmorphone (DILAUDID) 2 MG tablet Take 2 mg by mouth every 4 (four) hours as needed. For pain    . LANTUS SOLOSTAR 100 UNIT/ML SOPN Inject 20 Units as directed at bedtime.     [name] lisinopril-hydrochlorothiazide (PRINZIDE,ZESTORETIC) 20-12.5 MG per tablet Take 1 tablet by mouth daily.    . Multiple Vitamin (MULTIVITAMIN WITH MINERALS) TABS Take 1 tablet by mouth daily.    . Omega-3 Fatty Acids (FISH OIL PO) Take 1 capsule by mouth daily.    . potassium chloride SA (K-DUR,KLOR-CON) 20 MEQ tablet Take 20 mEq by mouth 2 (two) times daily as needed. With lasix    . simvastatin (ZOCOR) 40 MG tablet Take 40 mg by mouth at bedtime.      No current facility-administered medications on file prior to visit.    No Known Allergies   REVIEW OF SYSTEMS:  (Positives checked otherwise  negative)  CARDIOVASCULAR:  []  chest pain, []  chest pressure, []  palpitations, []  shortness of breath when laying flat, []  shortness of breath with exertion,  []  pain in feet when walking, []  pain in feet when laying flat, []  history of blood clot in veins (DVT), []  history of phlebitis, [x]  swelling in legs, [x]  varicose veins, [x]  venous ulcers  PULMONARY:  []  productive cough, []  asthma, []  wheezing  NEUROLOGIC:  []  weakness in arms or legs, []  numbness in arms or legs, []  difficulty speaking or slurred speech, []  temporary loss of vision in one eye, []  dizziness  HEMATOLOGIC:  []  bleeding problems, []  problems with blood clotting too easily  MUSCULOSKEL:  []  joint pain, []  joint swelling  GASTROINTEST:  []  vomiting blood, []  blood in stool     GENITOURINARY:  []  burning with urination, []  blood in urine  PSYCHIATRIC:  []  history of major depression  INTEGUMENTARY:  []  rashes, [x]  ulcers  CONSTITUTIONAL:  []  fever, []  chills   Physical Examination Filed Vitals:   03/28/14 1434  BP: 176/72  Pulse: 63  Height: 6\' 1"  (1.854 m)  Weight: 328 lb 14.4 oz (149.188 kg)  SpO2: 96%   Body mass index is 43.4 kg/(m^2).  General: A&O x 3, WD, morbidly obese  Head: [place]/AT  Ear/Nose/Throat: Hearing grossly intact, nares w/o erythema or drainage, oropharynx w/o Erythema/Exudate  Eyes: PERRLA, EOMI  Neck: Supple, no nuchal rigidity, no palpable LAD  Pulmonary: Sym exp, good air movt, CTAB, no rales, rhonchi, & wheezing  Cardiac: RRR, Nl S1, S2, no Murmurs, rubs or gallops  Vascular: Vessel Right Left  Radial Palpable Palpable  Brachial Palpable Palpable  Carotid Palpable, without bruit Palpable, without bruit  Aorta Not palpable due to obesity N/A  Femoral Palpable Palpable  Popliteal Not palpable Not palpable  PT Not Palpable Not Palpable  DP Palpable Palpable   Gastrointestinal: soft, NTND, -G/R, - HSM, - masses, - CVAT B  Musculoskeletal: M/S 5/5 throughout ,  Extremities without ischemic changes , extensive B LDS, L lower leg skin scaling consistent with CVI, evidence of healed ulcers medial in both legs, RLE 2+ edema, LLE 1+  Neurologic: CN 2-12 intact , Pain and light touch intact in extremities , Motor exam as listed above  Psychiatric: Judgment intact, Mood & affect appropriate for pt's clinical situation  Dermatologic: See M/S exam for extremity exam, no  rashes otherwise noted  Lymph : No Cervical, Axillary, or Inguinal lymphadenopathy    Non-Invasive Vascular Imaging  BLE Venous Insufficiency Duplex (Date: 03/28/2014):   RLE: no DVT and SVT, + GSV reflux: SFJ, R distal thigh and medial knee, + deep venous reflux: CFV, FV, PV, SFV, mild SSV reflux  LLE: no DVT and SVT, + GSV reflux: proximal GSV and mid calf, + deep venous reflux: CFV, PV, SFJ, +SSV reflux   Medical Decision Making  Mario Mills is a 63 y.o. male who presents with: BLE chronic venous insufficiency (C5).   Based on the patient's history and examination, I recommend: continued compressive therapy.  I discussed with the patient the use of her 20-30 mm thigh high compression stockings and need for such long-term wise given the extensive nature of his reflux disease.  I doubt that an single ablation or phlebectomy is likely to resolve this patient severe disease, so I favor continued compression stocking use as a preventative manuever.  Thank you for allowing [place] to participate in this patient's care.  Adele Barthel, MD [facility] Office: [phone] Pager: [phone]  03/28/2014, 4:14 PM

## 2014-04-06 ENCOUNTER — Encounter: Payer: No Typology Code available for payment source | Admitting: Vascular Surgery

## 2014-07-13 ENCOUNTER — Ambulatory Visit (INDEPENDENT_AMBULATORY_CARE_PROVIDER_SITE_OTHER): Payer: No Typology Code available for payment source | Admitting: Emergency Medicine

## 2014-07-13 ENCOUNTER — Ambulatory Visit (INDEPENDENT_AMBULATORY_CARE_PROVIDER_SITE_OTHER): Payer: No Typology Code available for payment source

## 2014-07-13 VITALS — BP 162/50 | HR 77 | Temp 97.0°F | Resp 20 | Ht 72.0 in | Wt 324.2 lb

## 2014-07-13 DIAGNOSIS — R05 Cough: Secondary | ICD-10-CM

## 2014-07-13 DIAGNOSIS — R509 Fever, unspecified: Secondary | ICD-10-CM

## 2014-07-13 DIAGNOSIS — R011 Cardiac murmur, unspecified: Secondary | ICD-10-CM

## 2014-07-13 DIAGNOSIS — J189 Pneumonia, unspecified organism: Secondary | ICD-10-CM

## 2014-07-13 DIAGNOSIS — R059 Cough, unspecified: Secondary | ICD-10-CM

## 2014-07-13 DIAGNOSIS — E119 Type 2 diabetes mellitus without complications: Secondary | ICD-10-CM

## 2014-07-13 LAB — POCT CBC
Granulocyte percent: 67.3 %G (ref 37–80)
HCT, POC: 38.1 % — AB (ref 43.5–53.7)
Hemoglobin: 12.4 g/dL — AB (ref 14.1–18.1)
LYMPH, POC: 3.1 (ref 0.6–3.4)
MCH: 29.1 pg (ref 27–31.2)
MCHC: 32.6 g/dL (ref 31.8–35.4)
MCV: 89.3 fL (ref 80–97)
MID (cbc): 1.2 — AB (ref 0–0.9)
MPV: 8.5 fL (ref 0–99.8)
PLATELET COUNT, POC: 304 10*3/uL (ref 142–424)
POC Granulocyte: 8.9 — AB (ref 2–6.9)
POC LYMPH PERCENT: 23.5 %L (ref 10–50)
POC MID %: 9.2 %M (ref 0–12)
RBC: 4.26 M/uL — AB (ref 4.69–6.13)
RDW, POC: 17.1 %
WBC: 13.2 10*3/uL — AB (ref 4.6–10.2)

## 2014-07-13 LAB — GLUCOSE, POCT (MANUAL RESULT ENTRY): POC Glucose: 166 mg/dl — AB (ref 70–99)

## 2014-07-13 MED ORDER — LEVOFLOXACIN 500 MG PO TABS: 500.0000 mg | ORAL_TABLET | Freq: Every day | ORAL | Status: AC

## 2014-07-13 MED ORDER — BENZONATATE 100 MG PO CAPS: 100.0000 mg | ORAL_CAPSULE | Freq: Three times a day (TID) | ORAL | Status: DC | PRN

## 2014-07-13 MED ORDER — CEFTRIAXONE SODIUM 1 G IJ SOLR
1.0000 g | Freq: Once | INTRAMUSCULAR | Status: AC
  Administered 2014-07-13: 1 g via INTRAMUSCULAR

## 2014-07-13 NOTE — Progress Notes (Addendum)
Subjective:    Patient ID: Mario Mills, male    DOB: 1950-07-31, 64 y.o.   MRN: [id number]  This chart was scribed for Darlyne Russian, MD by Stephania Fragmin, ED Scribe. This patient was seen in room 3 and the patient's care was started at 1:48 PM.   Chief Complaint  Patient presents with  . Fatigue    Since Monday  . Generalized Body Aches    Started last week  . Coughing    Productive, Dark Colored, since Monday  . Chills    Since Monday  . Labored Breathing    Since Monday  . Nasal Congestion    Since Monday    HPI  HPI Comments: Mario Mills is a 64 y.o. male with a history of DM who presents to the [facility] complaining of multiple symptoms. He states he had a sore throat, headache, and generalized myalgias 7 days ago. He started having congestion and cough productive with very dark sputum 4 days ago. He had taken Mucinex and another OTC medication, which improved his congestion. Patient has also had SOB and has been feeling fatigued. Patient has had a flu shot this year, but is unsure if he has had a pneumococcal vaccine. Patient denies a history of asthma, stating that he has never had to use an inhaler in the past. Patient last saw a cardiologist less than a year ago for his murmur.  Patient is a former smoker, but quit about 10 years ago. Patient ambulates with a cane. 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  Review of Systems  Constitutional: Positive for chills and fatigue. Negative for unexpected weight change.  HENT: Positive for congestion and sore throat.   Eyes: Negative for visual disturbance.  Respiratory: Positive for cough and shortness of breath. Negative for chest tightness.   Cardiovascular: Negative for chest pain, palpitations and leg swelling.  Gastrointestinal: Negative for abdominal pain and blood in stool.  Musculoskeletal: Positive for myalgias.  Neurological: Positive for headaches. Negative for dizziness and  light-headedness.       Objective:   Physical Exam  Nursing note and vitals reviewed. CONSTITUTIONAL: Well developed/well nourished HEAD: Normocephalic/atraumatic EYES: EOMI/PERRL ENMT: Nasal congestion; ears are normal; oropharynx slightly red NECK: supple no meningeal signs SPINE/BACK:entire spine nontender CV: S1/S2 noted, no rubs/gallops noted; there is a 3/6 harsh systolic murmur at the base of the heart LUNGS: Lungs have rhonchi bilaterally, with good air exchange ABDOMEN: soft, nontender, no rebound or guarding, bowel sounds noted throughout abdomen GU:no cva tenderness NEURO: Pt is awake/alert/appropriate, moves all extremitiesx4.  No facial droop.   EXTREMITIES: pulses normal/equal, full ROM SKIN: warm, color normal PSYCH: no abnormalities of mood noted, alert and oriented to situation  Results for orders placed or performed in visit on 07/13/14  POCT CBC  Result Value Ref Range   WBC 13.2 (A) 4.6 - 10.2 K/uL   Lymph, poc 3.1 0.6 - 3.4   POC LYMPH PERCENT 23.5 10 - 50 %L   MID (cbc) 1.2 (A) 0 - 0.9   POC MID % 9.2 0 - 12 %M   POC Granulocyte 8.9 (A) 2 - 6.9   Granulocyte percent 67.3 37 - 80 %G   RBC 4.26 (A) 4.69 - 6.13 M/uL   Hemoglobin 12.4 (A) 14.1 - 18.1 g/dL   HCT, POC 38.1 (A) 43.5 - 53.7 %   MCV 89.3 80 - 97 fL   MCH, POC 29.1 27 - 31.2  pg   MCHC 32.6 31.8 - 35.4 g/dL   RDW, POC 17.1 %   Platelet Count, POC 304 142 - 424 K/uL   MPV 8.5 0 - 99.8 fL  POCT glucose (manual entry)  Result Value Ref Range   POC Glucose 166 (A) 70 - 99 mg/dl  [facility] reading (PRIMARY) by  Dr. Everlene Farrier there appears to be an infiltrate seen best on lateral. I suspect this is in the posterior right lower lobe please comment     Assessment & Plan:  Patient here with right lower lobe infiltrate consistent with pneumonia. His white count is 13,200. Will treat with Levaquin 1 a day. He was given 1 g Rocephin IM. Recheck on Sunday I personally performed the services described in this  documentation, which was scribed in my presence. The recorded information has been reviewed and is accurate. I also made a referral for him to see the cardiologist about his heart murmur.

## 2014-07-13 NOTE — Patient Instructions (Signed)

## 2014-07-14 ENCOUNTER — Telehealth: Payer: Self-pay

## 2014-07-14 NOTE — Telephone Encounter (Signed)
Patient wants to know if he should stop taking mucinex. Please call! [phone]

## 2014-07-15 ENCOUNTER — Ambulatory Visit (INDEPENDENT_AMBULATORY_CARE_PROVIDER_SITE_OTHER): Payer: No Typology Code available for payment source | Admitting: Emergency Medicine

## 2014-07-15 VITALS — BP 156/70 | HR 71 | Temp 97.5°F | Resp 24 | Ht 72.0 in | Wt 326.5 lb

## 2014-07-15 DIAGNOSIS — J189 Pneumonia, unspecified organism: Secondary | ICD-10-CM

## 2014-07-15 LAB — POCT CBC
Granulocyte percent: 71.4 %G (ref 37–80)
HEMATOCRIT: 39.2 % — AB (ref 43.5–53.7)
HEMOGLOBIN: 12.7 g/dL — AB (ref 14.1–18.1)
LYMPH, POC: 3.3 (ref 0.6–3.4)
MCH: 28.8 pg (ref 27–31.2)
MCHC: 32.5 g/dL (ref 31.8–35.4)
MCV: 88.6 fL (ref 80–97)
MID (CBC): 0.4 (ref 0–0.9)
MPV: 8.4 fL (ref 0–99.8)
POC Granulocyte: 9.1 — AB (ref 2–6.9)
POC LYMPH PERCENT: 25.8 %L (ref 10–50)
POC MID %: 2.8 % (ref 0–12)
Platelet Count, POC: 323 10*3/uL (ref 142–424)
RBC: 4.43 M/uL — AB (ref 4.69–6.13)
RDW, POC: 16.4 %
WBC: 12.8 10*3/uL — AB (ref 4.6–10.2)

## 2014-07-15 NOTE — Progress Notes (Addendum)
   Subjective:    Patient ID: Mario Mills, male    DOB: 01/13/1951, 64 y.o.   MRN: [id number] This chart was scribed for Arlyss Queen, MD by Marti Sleigh, Medical Scribe. This patient was seen in Room 4 and the patient's care was started at 1:10 PM.  Chief Complaint  Patient presents with  . Follow-up    Pneumonia    HPI HPI Comments: Mario Mills is a 64 y.o. male with a hx of DM, HLD, HTN and chronic venous insufficiency who presents to [facility] for a checkup after being diagnosed with pneumonia two days ago. Pt endorses mild continuing cough productive with progressively lightening drainage. Pt states he is drinking abundant water. Pt is a former smoker.    Review of Systems  Constitutional: Negative for fever and chills.  HENT: Positive for congestion.   Respiratory: Positive for cough.   Musculoskeletal: Positive for gait problem (Unchanged from baseline).       Objective:   Physical Exam  Constitutional: He is oriented to person, place, and time. He appears well-developed and well-nourished. No distress.  HENT:  Head: Normocephalic and atraumatic.  Eyes: Pupils are equal, round, and reactive to light.  Neck: Neck supple.  Cardiovascular: Normal rate.   Pulmonary/Chest: Effort normal. No respiratory distress.  Few rales in right lateral chest.  Musculoskeletal: Normal range of motion.  Neurological: He is alert and oriented to person, place, and time. Coordination normal.  Skin: Skin is warm and dry. He is not diaphoretic.  Psychiatric: He has a normal mood and affect. His behavior is normal.  Nursing note and vitals reviewed.  Results for orders placed or performed in visit on 07/15/14  POCT CBC  Result Value Ref Range   WBC 12.8 (A) 4.6 - 10.2 K/uL   Lymph, poc 3.3 0.6 - 3.4   POC LYMPH PERCENT 25.8 10 - 50 %L   MID (cbc) 0.4 0 - 0.9   POC MID % 2.8 0 - 12 %M   POC Granulocyte 9.1 (A) 2 - 6.9   Granulocyte percent 71.4 37 - 80 %G   RBC 4.43 (A) 4.69 -  6.13 M/uL   Hemoglobin 12.7 (A) 14.1 - 18.1 g/dL   HCT, POC 39.2 (A) 43.5 - 53.7 %   MCV 88.6 80 - 97 fL   MCH, POC 28.8 27 - 31.2 pg   MCHC 32.5 31.8 - 35.4 g/dL   RDW, POC 16.4 %   Platelet Count, POC 323 142 - 424 K/uL   MPV 8.4 0 - 99.8 fL       Assessment & Plan:  Patient is clinically better. His white count is still elevated at 12,800. He is to finish his Levaquin I will check him on Friday and repeat his x-ray at that time.I personally performed the services described in this documentation, which was scribed in my presence. The recorded information has been reviewed and is accurate. He actually looks very good. Pulse ox is 96 white count is still high but he looks better.

## 2014-07-16 NOTE — Telephone Encounter (Signed)
Spoke with pt, he already spoke with a physician about this yesterday.

## 2014-07-20 ENCOUNTER — Ambulatory Visit (INDEPENDENT_AMBULATORY_CARE_PROVIDER_SITE_OTHER): Payer: Medicare Other

## 2014-07-20 ENCOUNTER — Ambulatory Visit (INDEPENDENT_AMBULATORY_CARE_PROVIDER_SITE_OTHER): Payer: Medicare Other | Admitting: Emergency Medicine

## 2014-07-20 VITALS — BP 152/54 | HR 70 | Temp 97.6°F | Resp 21 | Ht 72.0 in | Wt 324.8 lb

## 2014-07-20 DIAGNOSIS — J189 Pneumonia, unspecified organism: Secondary | ICD-10-CM

## 2014-07-20 NOTE — Progress Notes (Addendum)
   Subjective:    Patient ID: Mario Mills, male    DOB: Mar 31, 1951, 64 y.o.   MRN: [id number] This chart was scribed for Arlyss Queen, MD by Zola Button, Medical Scribe. This patient was seen in room 12 and the patient's care was started at 4:28 PM.   HPI HPI Comments: Mario Mills is a 64 y.o. male who presents to the [facility] for a follow-up for right lower lobe pneumonia. I first saw him for his pneumonia on 2/26; he had an injection and was started on a 10 day course of Levaquin. Patient states he has improved and his cough is much better. He states he only coughs up sputum once a day now. He states his breathing has been good. Patient denies SOB.    Review of Systems  Respiratory: Positive for cough. Negative for shortness of breath.        Objective:   Physical Exam CONSTITUTIONAL: Well developed/well nourished HEAD: Normocephalic/atraumatic EYES: EOM/PERRL ENMT: Mucous membranes moist NECK: supple no meningeal signs SPINE: entire spine nontender CV: Grade 2 systolic murmur, left sternal border. RRR. LUNGS: Lungs are clear to auscultation bilaterally, no apparent distress ABDOMEN: soft, nontender, no rebound or guarding GU: no cva tenderness NEURO: Pt is awake/alert, moves all extremitiesx4 EXTREMITIES: pulses normal, full ROM SKIN: warm, color normal PSYCH: no abnormalities of mood noted [facility] reading (PRIMARY) by  Dr. Everlene Farrier  there is a persistent right middle lobe infiltrate possibly on the PA view with some improvement please comment         Assessment & Plan:  Does appear to be a persistent infiltrate in the right middle lobe. Would advise one further film in about 2 weeks to be sure there is complete clearing.I personally performed the services described in this documentation, which was scribed in my presence. The recorded information has been reviewed and is accurate.

## 2014-08-03 ENCOUNTER — Ambulatory Visit (INDEPENDENT_AMBULATORY_CARE_PROVIDER_SITE_OTHER): Payer: Medicare Other | Admitting: Emergency Medicine

## 2014-08-03 ENCOUNTER — Ambulatory Visit (INDEPENDENT_AMBULATORY_CARE_PROVIDER_SITE_OTHER): Payer: Medicare Other

## 2014-08-03 VITALS — BP 116/76 | HR 61 | Temp 97.5°F | Resp 18 | Ht 72.0 in | Wt 324.6 lb

## 2014-08-03 DIAGNOSIS — J189 Pneumonia, unspecified organism: Secondary | ICD-10-CM

## 2014-08-03 NOTE — Progress Notes (Signed)
   Subjective:    Patient ID: Mario Mills, male    DOB: 1951-03-09, 64 y.o.   MRN: [id number] This chart was scribed for Arlyss Queen, MD by Marti Sleigh, Medical Scribe. This patient was seen in Room 13 and the patient's care was started at 1:19 PM.  Chief Complaint  Patient presents with  . Follow-up  . Pneumonia    HPI HPI Comments: [name] is a 64 y.o. male with a hx of HTN, OSA, and DM who presents to [facility] for a follow up checkup for a previous diagnosis of pneumonia. Pt was diagnosed with pneumonia two weeks ago. Pt denies current cough, SOB, wheezing, or fatigue. Pt states he is feeling better.     Review of Systems  Constitutional: Negative for fever and chills.  HENT: Negative for congestion, rhinorrhea and sinus pressure.   Respiratory: Negative for cough, shortness of breath and wheezing.        Objective:   Physical Exam  Constitutional: He is oriented to person, place, and time. He appears well-developed and well-nourished. No distress.  HENT:  Head: Normocephalic and atraumatic.  Eyes: Conjunctivae and EOM are normal. Pupils are equal, round, and reactive to light.  Neck: Neck supple.  Cardiovascular: Normal rate.   Pulmonary/Chest: Effort normal and breath sounds normal. No respiratory distress.  Bases clear to ascultation.  Musculoskeletal: Normal range of motion.  Neurological: He is alert and oriented to person, place, and time. Coordination normal.  Skin: Skin is warm and dry. He is not diaphoretic.  Psychiatric: He has a normal mood and affect. His behavior is normal.  Nursing note and vitals reviewed. [facility] reading (PRIMARY) by  Dr.Henreitta Spittler chest x-ray shows resolving right lower lobe infiltrate.      Assessment & Plan:  Patient looks great chest x-rays improved follow-up as needed.I personally performed the services described in this documentation, which was scribed in my presence. The recorded information has been reviewed and is  accurate.

## 2014-08-07 ENCOUNTER — Ambulatory Visit: Payer: No Typology Code available for payment source | Admitting: Internal Medicine

## 2014-08-13 ENCOUNTER — Telehealth: Payer: Self-pay | Admitting: Internal Medicine

## 2014-08-14 NOTE — Telephone Encounter (Signed)
Close encounter 

## 2014-08-15 ENCOUNTER — Ambulatory Visit (INDEPENDENT_AMBULATORY_CARE_PROVIDER_SITE_OTHER): Payer: Medicare Other

## 2014-08-15 ENCOUNTER — Ambulatory Visit (INDEPENDENT_AMBULATORY_CARE_PROVIDER_SITE_OTHER): Payer: Medicare Other | Admitting: Emergency Medicine

## 2014-08-15 VITALS — BP 140/90 | HR 66 | Temp 98.3°F | Resp 16 | Ht 72.0 in | Wt 322.0 lb

## 2014-08-15 DIAGNOSIS — J189 Pneumonia, unspecified organism: Secondary | ICD-10-CM

## 2014-08-15 DIAGNOSIS — E119 Type 2 diabetes mellitus without complications: Secondary | ICD-10-CM

## 2014-08-15 MED ORDER — CLARITHROMYCIN 500 MG PO TABS: 500.0000 mg | ORAL_TABLET | Freq: Two times a day (BID) | ORAL | Status: DC

## 2014-08-15 MED ORDER — ALBUTEROL SULFATE HFA 108 (90 BASE) MCG/ACT IN AERS: 2.0000 | INHALATION_SPRAY | RESPIRATORY_TRACT | Status: DC | PRN

## 2014-08-15 MED ORDER — HYDROCOD POLST-CHLORPHEN POLST 10-8 MG/5ML PO LQCR: 5.0000 mL | Freq: Two times a day (BID) | ORAL | Status: DC | PRN

## 2014-08-15 NOTE — Patient Instructions (Signed)
Metered Dose Inhaler (No Spacer Used)  Inhaled medicines are the basis of treatment for asthma and other breathing problems. Inhaled medicine can only be effective if used properly. Good technique assures that the medicine reaches the lungs.  Metered dose inhalers (MDIs) are used to deliver a variety of inhaled medicines. These include quick relief or rescue medicines (such as bronchodilators) and controller medicines (such as corticosteroids). The medicine is delivered by pushing down on a metal canister to release a set amount of spray.  If you are using different kinds of inhalers, use your quick relief medicine to open the airways 10-15 minutes before using a steroid, if instructed to do so by your health care provider. If you are unsure which inhalers to use and the order of using them, ask your health care provider, nurse, or respiratory therapist.  HOW TO USE THE INHALER  1. Remove the cap from the inhaler.  2. If you are using the inhaler for the first time, you will need to prime it. Shake the inhaler for 5 seconds and release four puffs into the air, away from your face. Ask your health care provider or pharmacist if you have questions about priming your inhaler.  3. Shake the inhaler for 5 seconds before each breath in (inhalation).  4. Position the inhaler so that the top of the canister faces up.  5. Put your index finger on the top of the medicine canister. Your thumb supports the bottom of the inhaler.  6. Open your mouth.  7. Either place the inhaler between your teeth and place your lips tightly around the mouthpiece, or hold the inhaler 1-2 inches away from your open mouth. If you are unsure of which technique to use, ask your health care provider.  8. Breathe out (exhale) normally and as completely as possible.  9. Press the canister down with the index finger to release the medicine.  10. At the same time as the canister is pressed, inhale deeply and slowly until your lungs are completely filled.  This should take 4-6 seconds. Keep your tongue down.  11. Hold the medicine in your lungs for 5-10 seconds (10 seconds is best). This helps the medicine get into the small airways of your lungs.  12. Breathe out slowly, through pursed lips. Whistling is an example of pursed lips.  13. Wait at least 1 minute between puffs. Continue with the above steps until you have taken the number of puffs your health care provider has ordered. Do not use the inhaler more than your health care provider directs you to.  14. Replace the cap on the inhaler.  15. Follow the directions from your health care provider or the inhaler insert for cleaning the inhaler.  If you are using a steroid inhaler, after your last puff, rinse your mouth with water, gargle, and spit out the water. Do not swallow the water.  AVOID:  · Inhaling before or after starting the spray of medicine. It takes practice to coordinate your breathing with triggering the spray.  · Inhaling through the nose (rather than the mouth) when triggering the spray.  HOW TO DETERMINE IF YOUR INHALER IS FULL OR NEARLY EMPTY  You cannot know when an inhaler is empty by shaking it. Some inhalers are now being made with dose counters. Ask your health care provider for a prescription that has a dose counter if you feel you need that extra help. If your inhaler does not have a counter, ask your health care   provider to help you determine the date you need to refill your inhaler. Write the refill date on a calendar or your inhaler canister. Refill your inhaler 7-10 days before it runs out. Be sure to keep an adequate supply of medicine. This includes making sure it has not expired, and making sure you have a spare inhaler.  SEEK MEDICAL CARE IF:  · Symptoms are only partially relieved with your inhaler.  · You are having trouble using your inhaler.  · You experience an increase in phlegm.  SEEK IMMEDIATE MEDICAL CARE IF:  · You feel little or no relief with your inhalers. You are still  wheezing and feeling shortness of breath, tightness in your chest, or both.  · You have dizziness, headaches, or a fast heart rate.  · You have chills, fever, or night sweats.  · There is a noticeable increase in phlegm production, or there is blood in the phlegm.  MAKE SURE YOU:  · Understand these instructions.  · Will watch your condition.  · Will get help right away if you are not doing well or get worse.  Document Released: 03/01/2007 Document Revised: 09/18/2013 Document Reviewed: 10/20/2012  [employer]® Patient Information ©2015 [employer]. This information is not intended to replace advice given to you by your health care provider. Make sure you discuss any questions you have with your health care provider.

## 2014-08-15 NOTE — Progress Notes (Signed)
[facility] [address] [place] [address] [phone]  Date:  08/15/2014   Name:  Mario Mills   DOB:  01-30-1951   MRN:  [id number]  PCP:  Philis Fendt, MD    Chief Complaint: Sore Throat; Nasal Congestion; Cough; and Generalized Body Aches   History of Present Illness:  Mario Mills is a 64 y.o. very pleasant male patient who presents with the following:  Patient with DM2 treated  2/26 with CAP. Resolved with levaquin Now to office with illness since Monday with nasal congestion and drainage clear in nature Has a cough and decreased exercise tolerance No wheezing.  Overall feels tired and weak.   No fever but chilled No improvement with over the counter medications or other home remedies.  Denies other complaint or health concern today.   Patient Active Problem List   Diagnosis Date Noted  . Venous stasis ulcers of both lower extremities 03/28/2014  . Chronic venous insufficiency 03/28/2014  . Essential hypertension 12/22/2012  . Hyperlipidemia 12/22/2012  . Type 2 diabetes mellitus 12/22/2012  . Obstructive sleep apnea 12/22/2012  . Peripheral arterial disease 12/22/2012  . Venous stasis 12/22/2012    Past Medical History  Diagnosis Date  . Hypertension   . Diabetes mellitus   . Hyperlipidemia   . Obstructive sleep apnea     CPAP  . Peripheral arterial disease   . Venous insufficiency   . Venous stasis ulcer   . DOE (dyspnea on exertion)     WITH MINIMAL EXERTION  . HA (headache)     WAKE UP FREQUENTLY WITH HA  . History of Doppler ultrasound 12/2012    LE VENOUS REFLUX demostrated on L (vein stripping on R w/MRSA); h/o venous stasis bilat   . Atrial fibrillation   . Heart murmur     Past Surgical History  Procedure Laterality Date  . Colon resection  2007  . Transthoracic echocardiogram  07/02/2005    mod concentric LVH; LV borderline dilated; LA mild-mod dilated; mild MR & TR  . Varicose vein surgery      R leg   . Colon surgery      History  Substance Use Topics  . Smoking status: Former Smoker -- 1.00 packs/day for 35 years    Types: Cigarettes    Quit date: 07/25/2005  . Smokeless tobacco: Never Used  . Alcohol Use: 0.0 oz/week    0.5 Standard drinks or equivalent per week    Family History  Problem Relation Age of Onset  . Pancreatic cancer Father   . Cancer Father   . Heart Problems Mother   . Hypertension Mother   . Diabetes Brother   . Hypertension Brother     No Known Allergies  Medication list has been reviewed and updated.  Current Outpatient Prescriptions on File Prior to Visit  Medication Sig Dispense Refill  . carvedilol (COREG) 12.5 MG tablet Take 12.5 mg by mouth 2 (two) times daily with a meal.    . CINNAMON PO Take 1 tablet by mouth 2 (two) times daily. 1-2 times a day    . clonazePAM (KLONOPIN) 0.5 MG tablet Take 0.5 mg by mouth 3 (three) times daily as needed for anxiety.    . Coenzyme Q10 (CO Q 10) 100 MG CAPS Take 1 capsule by mouth daily.    . diazepam (VALIUM) 10 MG tablet Take 10 mg by mouth daily as needed. pain    . Diclofenac-Misoprostol 75-0.2 MG  TBEC Take 1 tablet by mouth 2 (two) times daily.     [name] DILTIAZEM HCL CD 360 MG 24 hr capsule Take 360 mg by mouth daily.     . furosemide (LASIX) 40 MG tablet Take 40 mg by mouth daily. AS NEEDED For fluid retention    . glimepiride (AMARYL) 4 MG tablet Take 4 mg by mouth daily before breakfast.    . HYDROmorphone (DILAUDID) 2 MG tablet Take 2 mg by mouth every 4 (four) hours as needed. For pain    . LANTUS SOLOSTAR 100 UNIT/ML SOPN Inject 20 Units as directed at bedtime.     [name] lisinopril-hydrochlorothiazide (PRINZIDE,ZESTORETIC) 20-12.5 MG per tablet Take 1 tablet by mouth daily.    . metFORMIN (GLUMETZA) 500 MG (MOD) 24 hr tablet Take 500 mg by mouth 2 (two) times daily.    . Multiple Vitamin (MULTIVITAMIN WITH MINERALS) TABS Take 1 tablet by mouth daily.    . Omega-3 Fatty Acids (FISH OIL PO) Take 1 capsule  by mouth daily.    . potassium chloride SA (K-DUR,KLOR-CON) 20 MEQ tablet Take 20 mEq by mouth 2 (two) times daily as needed. With lasix    . simvastatin (ZOCOR) 40 MG tablet Take 40 mg by mouth at bedtime.     . benzonatate (TESSALON) 100 MG capsule Take 1-2 capsules (100-200 mg total) by mouth 3 (three) times daily as needed for cough. (Patient not taking: Reported on 08/03/2014) 40 capsule 0   No current facility-administered medications on file prior to visit.    Review of Systems:  As per HPI, otherwise negative.    Physical Examination: Filed Vitals:   08/15/14 1940  BP: 140/90  Pulse: 66  Temp: 98.3 F (36.8 C)  Resp: 16   Filed Vitals:   08/15/14 1940  Height: 6' (1.829 m)  Weight: 322 lb (146.058 kg)   Body mass index is 43.66 kg/(m^2). Ideal Body Weight: Weight in (lb) to have BMI = 25: 183.9  GEN: morbidly obese, NAD, Non-toxic, A & O x 3 HEENT: Atraumatic, Normocephalic. Neck supple. No masses, No LAD. Ears and Nose: No external deformity. CV: RRR, No M/G/R. No JVD. No thrill. No extra heart sounds. PULM: CTA B, no wheezes, crackles, rhonchi. No retractions. No resp. distress. No accessory muscle use. ABD: S, NT, ND, +BS. No rebound. No HSM. EXTR: No c/c/e NEURO Normal gait.  PSYCH: Normally interactive. Conversant. Not depressed or anxious appearing.  Calm demeanor.    Assessment and Plan: Influenza tamiflu tussionex  Signed,  Ellison Carwin, MD   [facility] reading (PRIMARY) by  Dr. Ouida Sills.  negative.

## 2014-08-16 ENCOUNTER — Ambulatory Visit: Payer: No Typology Code available for payment source | Admitting: Internal Medicine

## 2014-11-14 ENCOUNTER — Ambulatory Visit (INDEPENDENT_AMBULATORY_CARE_PROVIDER_SITE_OTHER): Payer: Medicare Other | Admitting: Podiatry

## 2014-11-14 DIAGNOSIS — B351 Tinea unguium: Secondary | ICD-10-CM

## 2014-11-14 DIAGNOSIS — M79675 Pain in left toe(s): Secondary | ICD-10-CM

## 2014-11-14 DIAGNOSIS — M79674 Pain in right toe(s): Secondary | ICD-10-CM | POA: Diagnosis not present

## 2014-11-14 DIAGNOSIS — E1151 Type 2 diabetes mellitus with diabetic peripheral angiopathy without gangrene: Secondary | ICD-10-CM

## 2014-11-14 DIAGNOSIS — M79676 Pain in unspecified toe(s): Secondary | ICD-10-CM

## 2014-11-14 NOTE — Progress Notes (Signed)
Patient ID: Mario Mills, male   DOB: [birth year], 64 y.o.   MRN: [id number] DiComplaint:  Visit Type: Patient returns to my office for continued preventative foot care services. Complaint: Patient states" my nails have grown long and thick and become painful to walk and wear shoes" Patient has been diagnosed with DM with vascular complications. He presents for preventative foot care services. No changes to ROS  Podiatric Exam: Vascular: dorsalis pedis and posterior tibial pulses are palpable bilateral. Capillary return is immediate. Temperature gradient is WNL. Skin turgor WNL  Sensorium: Normal Semmes Weinstein monofilament test. Normal tactile sensation bilaterally. Nail Exam: Pt has thick disfigured discolored nails with subungual debris noted bilateral entire nail hallux through fifth toenails Ulcer Exam: There is no evidence of ulcer or pre-ulcerative changes or infection. Orthopedic Exam: Muscle tone and strength are WNL. No limitations in general ROM. No crepitus or effusions noted. Foot type and digits show no abnormalities. Bony prominences are unremarkable. Skin: No Porokeratosis. No infection or ulcers  Diagnosis:  Tinea unguium, Pain in right toe, pain in left toes  Treatment & Plan Procedures and Treatment: Consent by patient was obtained for treatment procedures. The patient understood the discussion of treatment and procedures well. All questions were answered thoroughly reviewed. Debridement of mycotic and hypertrophic toenails, 1 through 5 bilateral and clearing of subungual debris. No ulceration, no infection noted.  Return Visit-Office Procedure: Patient instructed to return to the office for a follow up visit 3 months for continued evaluation and treatment.abetic foot and nail care

## 2015-02-08 ENCOUNTER — Ambulatory Visit (INDEPENDENT_AMBULATORY_CARE_PROVIDER_SITE_OTHER): Payer: Medicare Other | Admitting: Podiatry

## 2015-02-08 ENCOUNTER — Encounter: Payer: Self-pay | Admitting: Podiatry

## 2015-02-08 DIAGNOSIS — M79676 Pain in unspecified toe(s): Secondary | ICD-10-CM

## 2015-02-08 DIAGNOSIS — M79674 Pain in right toe(s): Secondary | ICD-10-CM

## 2015-02-08 DIAGNOSIS — B351 Tinea unguium: Secondary | ICD-10-CM

## 2015-02-08 DIAGNOSIS — M79675 Pain in left toe(s): Secondary | ICD-10-CM | POA: Diagnosis not present

## 2015-02-08 DIAGNOSIS — E1151 Type 2 diabetes mellitus with diabetic peripheral angiopathy without gangrene: Secondary | ICD-10-CM

## 2015-02-08 NOTE — Progress Notes (Signed)
Patient ID: Mario Mills, male   DOB: 05/14/1951, 64 y.o.   MRN: [id number] DiComplaint:  Visit Type: Patient returns to my office for continued preventative foot care services. Complaint: Patient states" my nails have grown long and thick and become painful to walk and wear shoes" Patient has been diagnosed with DM with vascular complications. He presents for preventative foot care services. No changes to ROS  Podiatric Exam: Vascular: dorsalis pedis and posterior tibial pulses are palpable bilateral. Capillary return is immediate. Temperature gradient is WNL. Skin turgor WNL  Sensorium: Normal Semmes Weinstein monofilament test. Normal tactile sensation bilaterally. Nail Exam: Pt has thick disfigured discolored nails with subungual debris noted bilateral entire nail hallux through fifth toenails Ulcer Exam: There is no evidence of ulcer or pre-ulcerative changes or infection. Orthopedic Exam: Muscle tone and strength are WNL. No limitations in general ROM. No crepitus or effusions noted. Foot type and digits show no abnormalities. Bony prominences are unremarkable. Skin: No Porokeratosis. No infection or ulcers  Diagnosis:  Tinea unguium, Pain in right toe, pain in left toes  Treatment & Plan Procedures and Treatment: Consent by patient was obtained for treatment procedures. The patient understood the discussion of treatment and procedures well. All questions were answered thoroughly reviewed. Debridement of mycotic and hypertrophic toenails, 1 through 5 bilateral and clearing of subungual debris. No ulceration, no infection noted.  Return Visit-Office Procedure: Patient instructed to return to the office for a follow up visit 3 months for continued evaluation and treatment.abetic foot and nail care  

## 2015-04-30 ENCOUNTER — Ambulatory Visit (INDEPENDENT_AMBULATORY_CARE_PROVIDER_SITE_OTHER): Payer: Medicare Other | Admitting: Cardiovascular Disease

## 2015-04-30 ENCOUNTER — Encounter: Payer: Self-pay | Admitting: Cardiovascular Disease

## 2015-04-30 VITALS — BP 156/84 | HR 59 | Ht 72.0 in | Wt 332.0 lb

## 2015-04-30 DIAGNOSIS — I10 Essential (primary) hypertension: Secondary | ICD-10-CM | POA: Diagnosis not present

## 2015-04-30 DIAGNOSIS — R011 Cardiac murmur, unspecified: Secondary | ICD-10-CM | POA: Diagnosis not present

## 2015-04-30 DIAGNOSIS — Z01818 Encounter for other preprocedural examination: Secondary | ICD-10-CM

## 2015-04-30 NOTE — Progress Notes (Signed)
04/30/2015 Mario Mills   [birth year]  [id number]  Primary Physician Philis Fendt, MD Primary Cardiologist: Lorretta Harp MD Renae Gloss   HPI:  The patient is a 64 year old, severely overweight, married [place] American male with a history of hypertension, hyperlipidemia, noninsulin-requiring diabetes and obstructive sleep apnea on CPAP. I last saw him in the office 12/22/12. He stopped smoking 9 years ago. I saw him last March 04, 2007. He successfully underwent colon resection after a normal 2D echo and Cardiolite stress test at that time. He is otherwise asymptomatic except for spinal stenosis.  He's been stable since except for a poorly healing left pretibial venous stasis ulcer thought that Dr. Judene Companion at the [facility] wound care clinic. Arterial and venous Dopplers were obtained our office on 12/16/12 revealing a right ankle regular neck supple 95 and a left of 0.73 with a high-frequency signal in his left SFA though it is hard to elicit a clear history of claudication. Venous Dopplers to suggest venous reflux on the left. 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 He is scheduled for left total knee replacement in January.  He denies chest pain but has chronic dyspnea on exertion.   Current Outpatient Prescriptions  Medication Sig Dispense Refill  . albuterol (PROVENTIL HFA;VENTOLIN HFA) 108 (90 BASE) MCG/ACT inhaler Inhale 2 puffs into the lungs every 4 (four) hours as needed for wheezing or shortness of breath (cough, shortness of breath or wheezing.). 1 Inhaler 1  . benzonatate (TESSALON) 100 MG capsule Take 1-2 capsules (100-200 mg total) by mouth 3 (three) times daily as needed for cough. 40 capsule 0  . carvedilol (COREG) 12.5 MG tablet Take 12.5 mg by mouth 2 (two) times daily with a meal.    . chlorpheniramine-HYDROcodone (TUSSIONEX PENNKINETIC ER) 10-8 MG/5ML LQCR Take 5 mLs by mouth every 12 (twelve) hours as needed. 60 mL 0  . CINNAMON PO  Take 1 tablet by mouth 2 (two) times daily. 1-2 times a day    . clarithromycin (BIAXIN) 500 MG tablet Take 1 tablet (500 mg total) by mouth 2 (two) times daily. 20 tablet 0  . clonazePAM (KLONOPIN) 0.5 MG tablet Take 0.5 mg by mouth 3 (three) times daily as needed for anxiety.    . Coenzyme Q10 (CO Q 10) 100 MG CAPS Take 1 capsule by mouth daily.    . diazepam (VALIUM) 10 MG tablet Take 10 mg by mouth daily as needed. pain    . Diclofenac-Misoprostol 75-0.2 MG TBEC Take 1 tablet by mouth 2 (two) times daily.     [name] DILTIAZEM HCL CD 360 MG 24 hr capsule Take 360 mg by mouth daily.     . furosemide (LASIX) 40 MG tablet Take 40 mg by mouth daily. AS NEEDED For fluid retention    . glimepiride (AMARYL) 4 MG tablet Take 4 mg by mouth daily before breakfast.    . HYDROmorphone (DILAUDID) 2 MG tablet Take 2 mg by mouth every 4 (four) hours as needed. For pain    . LANTUS SOLOSTAR 100 UNIT/ML SOPN Inject 20 Units as directed at bedtime.     [name] lisinopril-hydrochlorothiazide (PRINZIDE,ZESTORETIC) 20-12.5 MG per tablet Take 1 tablet by mouth daily.    . metFORMIN (GLUMETZA) 500 MG (MOD) 24 hr tablet Take 500 mg by mouth 2 (two) times daily.    . Multiple Vitamin (MULTIVITAMIN WITH MINERALS) TABS Take 1 tablet by mouth daily.    . Omega-3 Fatty Acids (FISH OIL  PO) Take 1 capsule by mouth daily.    . potassium chloride SA (K-DUR,KLOR-CON) 20 MEQ tablet Take 20 mEq by mouth 2 (two) times daily as needed. With lasix    . simvastatin (ZOCOR) 40 MG tablet Take 40 mg by mouth at bedtime.      No current facility-administered medications for this visit.    No Known Allergies  Social History   Social History  . Marital Status: Married    Spouse Name: N/A  . Number of Children: 2  . Years of Education: post grad   Occupational History  . 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 Smoking status: Former Smoker -- 1.00 packs/day for 35 years    Types: Cigarettes     Quit date: 07/25/2005  . Smokeless tobacco: Never Used  . Alcohol Use: 0.0 oz/week    0.5 Standard drinks or equivalent per week  . Drug Use: No  . Sexual Activity: Not on file   Other Topics Concern  . Not on file   Social History Narrative     Review of Systems: General: negative for chills, fever, night sweats or weight changes.  Cardiovascular: negative for chest pain, dyspnea on exertion, edema, orthopnea, palpitations, paroxysmal nocturnal dyspnea or shortness of breath Dermatological: negative for rash Respiratory: negative for cough or wheezing Urologic: negative for hematuria Abdominal: negative for nausea, vomiting, diarrhea, bright red blood per rectum, melena, or hematemesis Neurologic: negative for visual changes, syncope, or dizziness All other systems reviewed and are otherwise negative except as noted above.    Blood pressure 156/84, pulse 59, height 6' (1.829 m), weight 332 lb (150.594 kg).  General appearance: alert and no distress Neck: no adenopathy, no JVD, supple, symmetrical, trachea midline, thyroid not enlarged, symmetric, no tenderness/mass/nodules and soft bilateral carotid bruits Lungs: clear to auscultation bilaterally Heart: 2/6 systolic ejection murmur at the base Extremities: extremities normal, atraumatic, no cyanosis or edema  EKG sinus bradycardia 59 with lateral T-wave inversion and poor progression.  I personally reviewed this EKG  ASSESSMENT AND PLAN:   Hyperlipidemia History of hyperlipidemia on simvastatin followed by his PCP  Essential hypertension History of hypertension blood pressure measures 156/84. He is on carvedilol, lisinopril, and hydrochlorothiazide. Continue current meds at current dosing      Lorretta Harp MD [facility], [facility] 04/30/2015 11:38 AM

## 2015-04-30 NOTE — Assessment & Plan Note (Signed)
History of hypertension blood pressure measures 156/84. He is on carvedilol, lisinopril, and hydrochlorothiazide. Continue current meds at current dosing

## 2015-04-30 NOTE — Assessment & Plan Note (Signed)
History of hyperlipidemia on simvastatin followed by his PCP 

## 2015-04-30 NOTE — Patient Instructions (Signed)
Medication Instructions:  Your physician recommends that you continue on your current medications as directed. Please refer to the Current Medication list given to you today.   Labwork: none  Testing/Procedures: Your physician has requested that you have a lexiscan myoview. For further information please visit www.[email or web address]. Please follow instruction sheet, as given.  Your physician has requested that you have an echocardiogram. Echocardiography is a painless test that uses sound waves to create images of your heart. It provides your doctor with information about the size and shape of your heart and how well your heart's chambers and valves are working. This procedure takes approximately one hour. There are no restrictions for this procedure.    Follow-Up: Your physician wants you to follow-up in: 12 months with Dr. Berry. You will receive a reminder letter in the mail two months in advance. If you don't receive a letter, please call our office to schedule the follow-up appointment.   Any Other Special Instructions Will Be Listed Below (If Applicable).     If you need a refill on your cardiac medications before your next appointment, please call your pharmacy.   

## 2015-05-02 ENCOUNTER — Ambulatory Visit: Payer: Self-pay | Admitting: Orthopedic Surgery

## 2015-05-02 ENCOUNTER — Encounter: Payer: Self-pay | Admitting: Podiatry

## 2015-05-02 ENCOUNTER — Ambulatory Visit (INDEPENDENT_AMBULATORY_CARE_PROVIDER_SITE_OTHER): Payer: Medicare Other | Admitting: Podiatry

## 2015-05-02 DIAGNOSIS — M79674 Pain in right toe(s): Secondary | ICD-10-CM | POA: Diagnosis not present

## 2015-05-02 DIAGNOSIS — B351 Tinea unguium: Secondary | ICD-10-CM | POA: Diagnosis not present

## 2015-05-02 DIAGNOSIS — M79675 Pain in left toe(s): Secondary | ICD-10-CM | POA: Diagnosis not present

## 2015-05-02 DIAGNOSIS — E1151 Type 2 diabetes mellitus with diabetic peripheral angiopathy without gangrene: Secondary | ICD-10-CM

## 2015-05-02 DIAGNOSIS — M79676 Pain in unspecified toe(s): Secondary | ICD-10-CM | POA: Diagnosis not present

## 2015-05-02 NOTE — Progress Notes (Signed)
Preoperative surgical orders have been place into the Epic hospital system for Mario Mills on 05/02/2015, 10:37 AM  by Mickel Crow for surgery on 05-31-15.  Preop Total Knee orders including Experal, IV Tylenol, and IV Decadron as long as there are no contraindications to the above medications. Arlee Muslim, PA-C

## 2015-05-02 NOTE — Progress Notes (Signed)
Patient ID: Mario Mills, male   DOB: 1951/01/15, 64 y.o.   MRN: [id number] DiComplaint:  Visit Type: Patient returns to my office for continued preventative foot care services. Complaint: Patient states" my nails have grown long and thick and become painful to walk and wear shoes" Patient has been diagnosed with DM with vascular complications. He presents for preventative foot care services. No changes to ROS  Podiatric Exam: Vascular: dorsalis pedis and posterior tibial pulses are palpable bilateral. Capillary return is immediate. Temperature gradient is WNL. Skin turgor WNL  Sensorium: Normal Semmes Weinstein monofilament test. Normal tactile sensation bilaterally. Nail Exam: Pt has thick disfigured discolored nails with subungual debris noted bilateral entire nail hallux through fifth toenails Ulcer Exam: There is no evidence of ulcer or pre-ulcerative changes or infection. Orthopedic Exam: Muscle tone and strength are WNL. No limitations in general ROM. No crepitus or effusions noted. Foot type and digits show no abnormalities. Bony prominences are unremarkable. Skin: No Porokeratosis. No infection or ulcers  Diagnosis:  Tinea unguium, Pain in right toe, pain in left toes  Treatment & Plan Procedures and Treatment: Consent by patient was obtained for treatment procedures. The patient understood the discussion of treatment and procedures well. All questions were answered thoroughly reviewed. Debridement of mycotic and hypertrophic toenails, 1 through 5 bilateral and clearing of subungual debris. No ulceration, no infection noted.  Return Visit-Office Procedure: Patient instructed to return to the office for a  follow up visit 3 months for continued evaluation and treatment    Gardiner Barefoot DPM

## 2015-05-09 ENCOUNTER — Telehealth (HOSPITAL_COMMUNITY): Payer: Self-pay | Admitting: *Deleted

## 2015-05-09 NOTE — Telephone Encounter (Signed)
Left message on voicemail per DPR in reference to upcoming appointment scheduled on 05/15/15 at 1230 with detailed instructions given per Myocardial Perfusion Study Information Sheet for the test. LM to arrive 15 minutes early, and that it is imperative to arrive on time for appointment to keep from having the test rescheduled. If you need to cancel or reschedule your appointment, please call the office within 24 hours of your appointment. Failure to do so may result in a cancellation of your appointment, and a $50 no show fee. Phone number given for call back for any questions.Precious Segall, Ranae Palms

## 2015-05-14 ENCOUNTER — Encounter (HOSPITAL_BASED_OUTPATIENT_CLINIC_OR_DEPARTMENT_OTHER): Payer: No Typology Code available for payment source | Attending: General Surgery

## 2015-05-14 ENCOUNTER — Telehealth (HOSPITAL_COMMUNITY): Payer: Self-pay | Admitting: *Deleted

## 2015-05-14 NOTE — Telephone Encounter (Signed)
Left message on voicemail per DPR in reference to upcoming appointment scheduled on 05/15/15 at 1230 with detailed instructions given per Myocardial Perfusion Study Information Sheet for the test. LM to arrive 15 minutes early, and that it is imperative to arrive on time for appointment to keep from having the test rescheduled. If you need to cancel or reschedule your appointment, please call the office within 24 hours of your appointment. Failure to do so may result in a cancellation of your appointment, and a $50 no show fee. Phone number given for call back for any questions. Salisa Broz, Ranae Palms

## 2015-05-15 ENCOUNTER — Other Ambulatory Visit: Payer: Self-pay

## 2015-05-15 ENCOUNTER — Ambulatory Visit (HOSPITAL_BASED_OUTPATIENT_CLINIC_OR_DEPARTMENT_OTHER): Payer: Medicare Other

## 2015-05-15 ENCOUNTER — Ambulatory Visit (HOSPITAL_COMMUNITY): Payer: Medicare Other | Attending: Cardiovascular Disease

## 2015-05-15 DIAGNOSIS — E785 Hyperlipidemia, unspecified: Secondary | ICD-10-CM | POA: Insufficient documentation

## 2015-05-15 DIAGNOSIS — R9439 Abnormal result of other cardiovascular function study: Secondary | ICD-10-CM | POA: Diagnosis not present

## 2015-05-15 DIAGNOSIS — R011 Cardiac murmur, unspecified: Secondary | ICD-10-CM | POA: Insufficient documentation

## 2015-05-15 DIAGNOSIS — R002 Palpitations: Secondary | ICD-10-CM | POA: Diagnosis not present

## 2015-05-15 DIAGNOSIS — E119 Type 2 diabetes mellitus without complications: Secondary | ICD-10-CM | POA: Insufficient documentation

## 2015-05-15 DIAGNOSIS — I10 Essential (primary) hypertension: Secondary | ICD-10-CM

## 2015-05-15 DIAGNOSIS — I350 Nonrheumatic aortic (valve) stenosis: Secondary | ICD-10-CM | POA: Insufficient documentation

## 2015-05-15 DIAGNOSIS — R0609 Other forms of dyspnea: Secondary | ICD-10-CM | POA: Diagnosis not present

## 2015-05-15 DIAGNOSIS — Z6841 Body Mass Index (BMI) 40.0 and over, adult: Secondary | ICD-10-CM | POA: Insufficient documentation

## 2015-05-15 DIAGNOSIS — I517 Cardiomegaly: Secondary | ICD-10-CM | POA: Insufficient documentation

## 2015-05-15 DIAGNOSIS — Z01818 Encounter for other preprocedural examination: Secondary | ICD-10-CM

## 2015-05-15 DIAGNOSIS — Z87891 Personal history of nicotine dependence: Secondary | ICD-10-CM | POA: Insufficient documentation

## 2015-05-15 MED ORDER — REGADENOSON 0.4 MG/5ML IV SOLN
0.4000 mg | Freq: Once | INTRAVENOUS | Status: AC
  Administered 2015-05-15: 0.4 mg via INTRAVENOUS

## 2015-05-15 MED ORDER — TECHNETIUM TC 99M SESTAMIBI GENERIC - CARDIOLITE
30.9000 | Freq: Once | INTRAVENOUS | Status: AC | PRN
  Administered 2015-05-15: 30.9 via INTRAVENOUS

## 2015-05-16 ENCOUNTER — Ambulatory Visit (HOSPITAL_COMMUNITY): Payer: Medicare Other | Attending: Cardiovascular Disease

## 2015-05-16 LAB — MYOCARDIAL PERFUSION IMAGING
CHL CUP NUCLEAR SDS: 4
CHL CUP NUCLEAR SSS: 8
CHL CUP RESTING HR STRESS: 60 {beats}/min
LHR: 0.28
LV sys vol: 110 mL
LVDIAVOL: 206 mL
Peak HR: 69 {beats}/min
SRS: 4
TID: 1.08

## 2015-05-16 MED ORDER — TECHNETIUM TC 99M SESTAMIBI GENERIC - CARDIOLITE
32.4000 | Freq: Once | INTRAVENOUS | Status: AC | PRN
  Administered 2015-05-16: 32 via INTRAVENOUS

## 2015-05-24 ENCOUNTER — Encounter: Payer: Self-pay | Admitting: Cardiovascular Disease

## 2015-05-24 ENCOUNTER — Ambulatory Visit (INDEPENDENT_AMBULATORY_CARE_PROVIDER_SITE_OTHER): Payer: Medicare Other | Admitting: Cardiovascular Disease

## 2015-05-24 ENCOUNTER — Encounter (HOSPITAL_COMMUNITY)
Admission: RE | Admit: 2015-05-24 | Discharge: 2015-05-24 | Disposition: A | Discharge status: Home / Self Care | Payer: Medicare Other | Source: Ambulatory Visit | Attending: Orthopedic Surgery | Admitting: Orthopedic Surgery

## 2015-05-24 ENCOUNTER — Encounter (HOSPITAL_COMMUNITY): Payer: Self-pay

## 2015-05-24 VITALS — BP 132/72 | HR 64 | Ht 71.5 in | Wt 325.7 lb

## 2015-05-24 DIAGNOSIS — E785 Hyperlipidemia, unspecified: Secondary | ICD-10-CM | POA: Diagnosis not present

## 2015-05-24 DIAGNOSIS — I10 Essential (primary) hypertension: Secondary | ICD-10-CM

## 2015-05-24 DIAGNOSIS — G4733 Obstructive sleep apnea (adult) (pediatric): Secondary | ICD-10-CM | POA: Diagnosis not present

## 2015-05-24 DIAGNOSIS — E119 Type 2 diabetes mellitus without complications: Secondary | ICD-10-CM | POA: Insufficient documentation

## 2015-05-24 DIAGNOSIS — Z01812 Encounter for preprocedural laboratory examination: Secondary | ICD-10-CM | POA: Diagnosis present

## 2015-05-24 DIAGNOSIS — I499 Cardiac arrhythmia, unspecified: Secondary | ICD-10-CM | POA: Insufficient documentation

## 2015-05-24 DIAGNOSIS — I4891 Unspecified atrial fibrillation: Secondary | ICD-10-CM | POA: Insufficient documentation

## 2015-05-24 HISTORY — DX: Unspecified urinary incontinence: R32

## 2015-05-24 HISTORY — DX: Anesthesia of skin: R20.0

## 2015-05-24 HISTORY — DX: Unspecified osteoarthritis, unspecified site: M19.90

## 2015-05-24 HISTORY — DX: Paresthesia of skin: R20.2

## 2015-05-24 LAB — COMPREHENSIVE METABOLIC PANEL
ALT: 26 U/L (ref 17–63)
AST: 22 U/L (ref 15–41)
Albumin: 3.7 g/dL (ref 3.5–5.0)
Alkaline Phosphatase: 66 U/L (ref 38–126)
Anion gap: 10 (ref 5–15)
BUN: 23 mg/dL — AB (ref 6–20)
CHLORIDE: 104 mmol/L (ref 101–111)
CO2: 28 mmol/L (ref 22–32)
CREATININE: 1.09 mg/dL (ref 0.61–1.24)
Calcium: 9.4 mg/dL (ref 8.9–10.3)
GFR calc Af Amer: >60 mL/min (ref >60)
GFR calc non Af Amer: >60 mL/min (ref >60)
Glucose, Bld: 171 mg/dL — ABNORMAL HIGH (ref 65–99)
Potassium: 3.6 mmol/L (ref 3.5–5.1)
SODIUM: 142 mmol/L (ref 135–145)
Total Bilirubin: 0.3 mg/dL (ref 0.3–1.2)
Total Protein: 7.4 g/dL (ref 6.5–8.1)

## 2015-05-24 LAB — APTT: aPTT: 33 seconds (ref 24–37)

## 2015-05-24 LAB — URINE MICROSCOPIC-ADD ON

## 2015-05-24 LAB — URINALYSIS, ROUTINE W REFLEX MICROSCOPIC
Bilirubin Urine: NEGATIVE
Glucose, UA: NEGATIVE mg/dL
Hgb urine dipstick: NEGATIVE
KETONES UR: NEGATIVE mg/dL
LEUKOCYTES UA: NEGATIVE
NITRITE: NEGATIVE
Specific Gravity, Urine: 1.026 (ref 1.005–1.030)
pH: 5 (ref 5.0–8.0)

## 2015-05-24 LAB — CBC
HCT: 38.8 % — ABNORMAL LOW (ref 39.0–52.0)
Hemoglobin: 12.5 g/dL — ABNORMAL LOW (ref 13.0–17.0)
MCH: 29.6 pg (ref 26.0–34.0)
MCHC: 32.2 g/dL (ref 30.0–36.0)
MCV: 91.9 fL (ref 78.0–100.0)
PLATELETS: 325 10*3/uL (ref 150–400)
RBC: 4.22 MIL/uL (ref 4.22–5.81)
RDW: 14.6 % (ref 11.5–15.5)
WBC: 11.1 10*3/uL — AB (ref 4.0–10.5)

## 2015-05-24 LAB — PROTIME-INR
INR: 0.96 (ref 0.00–1.49)
Prothrombin Time: 13 seconds (ref 11.6–15.2)

## 2015-05-24 LAB — ABO/RH: ABO/RH(D): O POS

## 2015-05-24 LAB — SURGICAL PCR SCREEN
MRSA, PCR: NEGATIVE
STAPHYLOCOCCUS AUREUS: POSITIVE — AB

## 2015-05-24 NOTE — Patient Instructions (Addendum)
Mario Mills  05/24/2015   Your procedure is scheduled on: Friday May 31, 2015  Report to [facility]  Entrance take [place]  elevators to 3rd floor to  [name] at 7:15 AM.  Call this number if you have problems the morning of surgery [phone]   Remember: ONLY 1 PERSON MAY GO WITH YOU TO SHORT STAY TO GET  READY MORNING OF [name].  Do not eat food or drink liquids :After Midnight.     Take these medicines the morning of surgery with A SIP OF WATER: Carvedilol (Coreg); Diazepam (Valium) if needed; Diltiazem; Gabapentin (Neurontin); Hydromorphone (Dilaudid) if needed;    TAKE 1/2 OF EVENING DOSE OF INSULIN NIGHT PRIOR TO SURGERY DO NOT TAKE ANY DIABETIC MEDICATIONS DAY OF YOUR SURGERY                               You may not have any metal on your body including hair pins and              piercings  Do not wear jewelry, lotions, powders or colognes, deodorant                           Men may shave face and neck.   Do not bring valuables to the hospital. [place].  Contacts, dentures or bridgework may not be worn into surgery.  Leave suitcase in the car. After surgery it may be brought to your room.                Please read over the following fact sheets you were given:INCENTIVE SPIROMETER; BLOOD TRANSFUSION INFORMATION SHEET  _____________________________________________________________________             [facility] - Preparing for Surgery Before surgery, you can play an important role.  Because skin is not sterile, your skin needs to be as free of germs as possible.  You can reduce the number of germs on your skin by washing with CHG (chlorahexidine gluconate) soap before surgery.  CHG is an antiseptic cleaner which kills germs and bonds with the skin to continue killing germs even after washing. Please DO NOT use if you have an allergy to CHG or antibacterial soaps.  If your  skin becomes reddened/irritated stop using the CHG and inform your nurse when you arrive at Short Stay. Do not shave (including legs and underarms) for at least 48 hours prior to the first CHG shower.  You may shave your face/neck. Please follow these instructions carefully:  1.  Shower with CHG Soap the night before surgery and the  morning of Surgery.  2.  If you choose to wash your hair, wash your hair first as usual with your  normal  shampoo.  3.  After you shampoo, rinse your hair and body thoroughly to remove the  shampoo.                           4.  Use CHG as you would any other liquid soap.  You can apply chg directly  to the skin and wash  Gently with a scrungie or clean washcloth.  5.  Apply the CHG Soap to your body ONLY FROM THE NECK DOWN.   Do not use on face/ open                           Wound or open sores. Avoid contact with eyes, ears mouth and genitals (private parts).                       Wash face,  Genitals (private parts) with your normal soap.             6.  Wash thoroughly, paying special attention to the area where your surgery  will be performed.  7.  Thoroughly rinse your body with warm water from the neck down.  8.  DO NOT shower/wash with your normal soap after using and rinsing off  the CHG Soap.                9.  Pat yourself dry with a clean towel.            10.  Wear clean pajamas.            11.  Place clean sheets on your bed the night of your first shower and do not  sleep with pets. Day of Surgery : Do not apply any lotions/deodorants the morning of surgery.  Please wear clean clothes to the hospital/surgery center.  FAILURE TO FOLLOW THESE INSTRUCTIONS MAY RESULT IN THE CANCELLATION OF YOUR SURGERY PATIENT SIGNATURE_________________________________  NURSE SIGNATURE__________________________________  ________________________________________________________________________   Mario Mills  An incentive spirometer  is a tool that can help keep your lungs clear and active. This tool measures how well you are filling your lungs with each breath. Taking long deep breaths may help reverse or decrease the chance of developing breathing (pulmonary) problems (especially infection) following:  A long period of time when you are unable to move or be active. BEFORE THE PROCEDURE   If the spirometer includes an indicator to show your best effort, your nurse or respiratory therapist will set it to a desired goal.  If possible, sit up straight or lean slightly forward. Try not to slouch.  Hold the incentive spirometer in an upright position. INSTRUCTIONS FOR USE   Sit on the edge of your bed if possible, or sit up as far as you can in bed or on a chair.  Hold the incentive spirometer in an upright position.  Breathe out normally.  Place the mouthpiece in your mouth and seal your lips tightly around it.  Breathe in slowly and as deeply as possible, raising the piston or the ball toward the top of the column.  Hold your breath for 3-5 seconds or for as long as possible. Allow the piston or ball to fall to the bottom of the column.  Remove the mouthpiece from your mouth and breathe out normally.  Rest for a few seconds and repeat Steps 1 through 7 at least 10 times every 1-2 hours when you are awake. Take your time and take a few normal breaths between deep breaths.  The spirometer may include an indicator to show your best effort. Use the indicator as a goal to work toward during each repetition.  After each set of 10 deep breaths, practice coughing to be sure your lungs are clear. If you have an incision (the cut made at the time of surgery),  support your incision when coughing by placing a pillow or rolled up towels firmly against it. Once you are able to get out of bed, walk around indoors and cough well. You may stop using the incentive spirometer when instructed by your caregiver.  RISKS AND  COMPLICATIONS  Take your time so you do not get dizzy or light-headed.  If you are in pain, you may need to take or ask for pain medication before doing incentive spirometry. It is harder to take a deep breath if you are having pain. AFTER USE  Rest and breathe slowly and easily.  It can be helpful to keep track of a log of your progress. Your caregiver can provide you with a simple table to help with this. If you are using the spirometer at home, follow these instructions: [place] IF:   You are having difficultly using the spirometer.  You have trouble using the spirometer as often as instructed.  Your pain medication is not giving enough relief while using the spirometer.  You develop fever of 100.5 F (38.1 C) or higher. SEEK IMMEDIATE MEDICAL CARE IF:   You cough up bloody sputum that had not been present before.  You develop fever of 102 F (38.9 C) or greater.  You develop worsening pain at or near the incision site. MAKE SURE YOU:   Understand these instructions.  Will watch your condition.  Will get help right away if you are not doing well or get worse. Document Released: 09/14/2006 Document Revised: 07/27/2011 Document Reviewed: 11/15/2006 Exit[employer] Patient Information 2014 [employer], [place].   ________________________________________________________________________  WHAT IS A BLOOD TRANSFUSION? Blood Transfusion Information  A transfusion is the replacement of blood or some of its parts. Blood is made up of multiple cells which provide different functions.  Red blood cells carry oxygen and are used for blood loss replacement.  White blood cells fight against infection.  Platelets control bleeding.  Plasma helps clot blood.  Other blood products are available for specialized needs, such as hemophilia or other clotting disorders. BEFORE THE TRANSFUSION  Who gives blood for transfusions?   Healthy volunteers who are fully evaluated to make sure  their blood is safe. This is blood bank blood. Transfusion therapy is the safest it has ever been in the practice of medicine. Before blood is taken from a donor, a complete history is taken to make sure that person has no history of diseases nor engages in risky social behavior (examples are intravenous drug use or sexual activity with multiple partners). The donor's travel history is screened to minimize risk of transmitting infections, such as malaria. The donated blood is tested for signs of infectious diseases, such as HIV and hepatitis. The blood is then tested to be sure it is compatible with you in order to minimize the chance of a transfusion reaction. If you or a relative donates blood, this is often done in anticipation of surgery and is not appropriate for emergency situations. It takes many days to process the donated blood. RISKS AND COMPLICATIONS Although transfusion therapy is very safe and saves many lives, the main dangers of transfusion include:   Getting an infectious disease.  Developing a transfusion reaction. This is an allergic reaction to something in the blood you were given. Every precaution is taken to prevent this. The decision to have a blood transfusion has been considered carefully by your caregiver before blood is given. Blood is not given unless the benefits outweigh the risks. AFTER THE TRANSFUSION  Right after receiving a blood transfusion, you will usually feel much better and more energetic. This is especially true if your red blood cells have gotten low (anemic). The transfusion raises the level of the red blood cells which carry oxygen, and this usually causes an energy increase.  The nurse administering the transfusion will monitor you carefully for complications. HOME CARE INSTRUCTIONS  No special instructions are needed after a transfusion. You may find your energy is better. Speak with your caregiver about any limitations on activity for underlying diseases  you may have. SEEK MEDICAL CARE IF:   Your condition is not improving after your transfusion.  You develop redness or irritation at the intravenous (IV) site. SEEK IMMEDIATE MEDICAL CARE IF:  Any of the following symptoms occur over the next 12 hours:  Shaking chills.  You have a temperature by mouth above 102 F (38.9 C), not controlled by medicine.  Chest, back, or muscle pain.  People around you feel you are not acting correctly or are confused.  Shortness of breath or difficulty breathing.  Dizziness and fainting.  You get a rash or develop hives.  You have a decrease in urine output.  Your urine turns a dark color or changes to pink, red, or brown. Any of the following symptoms occur over the next 10 days:  You have a temperature by mouth above 102 F (38.9 C), not controlled by medicine.  Shortness of breath.  Weakness after normal activity.  The white part of the eye turns yellow (jaundice).  You have a decrease in the amount of urine or are urinating less often.  Your urine turns a dark color or changes to pink, red, or brown. Document Released: 05/01/2000 Document Revised: 07/27/2011 Document Reviewed: 12/19/2007 [facility] Patient Information 2014 [place], [place].  _______________________________________________________________________

## 2015-05-24 NOTE — Patient Instructions (Signed)
Medication Instructions:  Your physician recommends that you continue on your current medications as directed. Please refer to the Current Medication list given to you today.   Labwork: none  Testing/Procedures: none  Follow-Up: Your physician wants you to follow-up in: 12 months with Dr. Andria Rhein will receive a reminder letter in the mail two months in advance. If you don't receive a letter, please call our office to schedule the follow-up appointment.   Any Other Special Instructions Will Be Listed Below (If Applicable).  Pt is cleared for surgery with Mild Cardiovascular Risk for Knee replacement    If you need a refill on your cardiac medications before your next appointment, please call your pharmacy.

## 2015-05-24 NOTE — Progress Notes (Signed)
Mr. Vangorder returns today for follow-up of his outpatient noninvasive tests performed for preoperative clearance before a total knee replacement scheduled to be done sometime this month. This echo was normal. His Myoview stress test showed mild inferolateral ischemia. He denies chest pain. I think that is ischemic abnormality is mild enough that I'm going to clear him at mildly elevated cardiovascular risk. I will see back in one year for follow-up.

## 2015-05-24 NOTE — Progress Notes (Signed)
Clearance per Dr Berry/cardiology 05/24/2015 ECHO results and Myoview Results / epic 05/15/2015 CXR/epic 08/15/2014 EKG/epic 04/30/2015

## 2015-05-25 LAB — HEMOGLOBIN A1C
HEMOGLOBIN A1C: 6.8 % — AB (ref 4.8–5.6)
Mean Plasma Glucose: 148 mg/dL

## 2015-05-27 NOTE — Progress Notes (Addendum)
CMP, A1C, urinalysis and micro results in epic per PAT visit 05/24/2015 sent to Dr Wynelle Link

## 2015-05-27 NOTE — Progress Notes (Signed)
Surgical screening results in epic per PAT visit 05/24/2015 positive for STAPH. Results sent to Dr Wynelle Link. Pt will need nasal betadine swab due to limited time frame for pt to complete murpriocin ointment prior to surgical procedure.

## 2015-05-28 NOTE — H&P (Signed)
TOTAL KNEE ADMISSION H&P  Patient is being admitted for left total knee arthroplasty.  Subjective:  Chief Complaint:left knee pain.  HPI: JAMESRYAN KRAMMER, 65 y.o. male, has a history of pain and functional disability in the left knee due to trauma and has failed non-surgical conservative treatments for greater than 12 weeks to includeNSAID's and/or analgesics, corticosteriod injections, viscosupplementation injections, use of assistive devices and activity modification.  Onset of symptoms was gradual, starting >10 years ago with gradually worsening course since that time. The patient noted no past surgery on the left knee(s).  Patient currently rates pain in the left knee(s) at 7 out of 10 with activity. Patient has night pain, worsening of pain with activity and weight bearing, pain that interferes with activities of daily living, pain with passive range of motion, crepitus and joint swelling.  Patient has evidence of periarticular osteophytes and joint space narrowing by imaging studies. There is no active infection.  Patient Active Problem List   Diagnosis Date Noted  . Onychomycosis 11/14/2014  . Venous stasis ulcers of both lower extremities 03/28/2014  . Chronic venous insufficiency 03/28/2014  . Essential hypertension 12/22/2012  . Hyperlipidemia 12/22/2012  . Type 2 diabetes mellitus ([place]) 12/22/2012  . Obstructive sleep apnea 12/22/2012  . Peripheral arterial disease ([place]) 12/22/2012  . Venous stasis 12/22/2012   Past Medical History  Diagnosis Date  . Hypertension   . Diabetes mellitus   . Hyperlipidemia   . Obstructive sleep apnea     CPAP  . Peripheral arterial disease ([place])   . Venous insufficiency   . Venous stasis ulcer ([place])   . DOE (dyspnea on exertion)     WITH MINIMAL EXERTION  . HA (headache)     WAKE UP FREQUENTLY WITH HA  . History of Doppler ultrasound 12/2012    LE VENOUS REFLUX demostrated on L (vein stripping on R w/MRSA); h/o venous stasis bilat   .  Atrial fibrillation ([place])   . Heart murmur   . Dysrhythmia   . History of urinary tract infection   . Urinary incontinence   . Arthritis   . Numbness and tingling     feet bilat   . Falls     Past Surgical History  Procedure Laterality Date  . Colon resection  2007  . Transthoracic echocardiogram  07/02/2005    mod concentric LVH; LV borderline dilated; LA mild-mod dilated; mild MR & TR  . Varicose vein surgery      R leg  . Colon surgery    . Tonsillectomy       Current outpatient prescriptions:  .  albuterol (PROVENTIL HFA;VENTOLIN HFA) 108 (90 BASE) MCG/ACT inhaler, Inhale 2 puffs into the lungs every 4 (four) hours as needed for wheezing or shortness of breath (cough, shortness of breath or wheezing.)., Disp: 1 Inhaler, Rfl: 1 .  Aspirin-Caffeine (BAYER BACK & BODY PO), Take 2 tablets by mouth 2 (two) times daily., Disp: , Rfl:  .  carvedilol (COREG) 12.5 MG tablet, Take 12.5 mg by mouth 2 (two) times daily with a meal., Disp: , Rfl:  .  Cinnamon 500 MG capsule, Take 500 mg by mouth daily. , Disp: , Rfl:  .  COENZYME Q-10 PO, Take 1 tablet by mouth daily., Disp: , Rfl:  .  diazepam (VALIUM) 5 MG tablet, Take 5 mg by mouth 2 (two) times daily. , Disp: , Rfl: 2 .  Diclofenac-Misoprostol 75-0.2 MG TBEC, Take 1 tablet by mouth 2 (two) times daily. ,  Disp: , Rfl:  .  DILTIAZEM HCL CD 360 MG 24 hr capsule, Take 360 mg by mouth daily. , Disp: , Rfl:  .  furosemide (LASIX) 40 MG tablet, Take 40 mg by mouth daily as needed for fluid. , Disp: , Rfl:  .  gabapentin (NEURONTIN) 300 MG capsule, Take 300 mg by mouth 2 (two) times daily., Disp: , Rfl:  .  glimepiride (AMARYL) 4 MG tablet, Take 4 mg by mouth daily before breakfast., Disp: , Rfl:  .  HYDROmorphone (DILAUDID) 4 MG tablet, TAKE 1 TABLET BY MOUTH twice daily as needed for pain, Disp: , Rfl: 0 .  Insulin Glargine (LANTUS SOLOSTAR) 100 UNIT/ML Solostar Pen, Inject 20 Units daily at bedtime, Disp: , Rfl:  .  KRILL OIL PO, Take 1  capsule by mouth daily., Disp: , Rfl:  .  L-Arginine 500 MG CAPS, Take 1,000 mg by mouth daily., Disp: , Rfl:  .  lisinopril-hydrochlorothiazide (PRINZIDE,ZESTORETIC) 20-12.5 MG per tablet, Take 1 tablet by mouth daily., Disp: , Rfl:  .  metFORMIN (GLUCOPHAGE) 1000 MG tablet, Take 1,000 mg by mouth 2 (two) times daily with a meal., Disp: , Rfl:  .  Multiple Vitamin (MULTIVITAMIN WITH MINERALS) TABS, Take 1 tablet by mouth daily., Disp: , Rfl:  .  oxybutynin (DITROPAN) 5 MG tablet, Take 5 mg by mouth 2 (two) times daily., Disp: , Rfl: 5 .  potassium chloride SA (K-DUR,KLOR-CON) 20 MEQ tablet, Take 20 mEq by mouth 2 (two) times daily as needed (take with lasix). , Disp: , Rfl:  .  simvastatin (ZOCOR) 40 MG tablet, Take 40 mg by mouth at bedtime. , Disp: , Rfl:  .  vitamin E 1000 UNIT capsule, Take 1,000 Units by mouth daily., Disp: , Rfl:  .  LEVEMIR FLEXTOUCH 100 UNIT/ML Pen, Inject 20 Units into the skin at bedtime., Disp: , Rfl: 5 .  triamcinolone cream (KENALOG) 0.1 %, Apply 1 application topically daily as needed., Disp: , Rfl: 1  No Known Allergies  Social History  Substance Use Topics  . Smoking status: Former Smoker -- 1.00 packs/day for 35 years    Types: Cigarettes    Quit date: 07/25/2005  . Smokeless tobacco: Never Used  . Alcohol Use: 0.0 oz/week    0.5 Standard drinks or equivalent per week     Comment: rarely     Family History  Problem Relation Age of Onset  . Pancreatic cancer Father   . Cancer Father   . Heart Problems Mother   . Hypertension Mother   . Diabetes Brother   . Hypertension Brother      Review of Systems  Constitutional: Positive for malaise/fatigue. Negative for fever, chills, weight loss and diaphoresis.  HENT: Negative.   Eyes: Negative.   Respiratory: Positive for shortness of breath. Negative for cough, hemoptysis, sputum production and wheezing.        SOB with exertion  Cardiovascular: Positive for orthopnea. Negative for chest pain,  palpitations, claudication, leg swelling and PND.  Gastrointestinal: Positive for heartburn. Negative for nausea, vomiting, abdominal pain, diarrhea, constipation, blood in stool and melena.  Genitourinary: Positive for frequency. Negative for dysuria, urgency, hematuria and flank pain.  Musculoskeletal: Positive for back pain and joint pain. Negative for myalgias, falls and neck pain.  Skin: Negative.   Neurological: Negative.  Negative for weakness.  Endo/Heme/Allergies: Negative.   Psychiatric/Behavioral: Negative.     Objective:  Physical Exam  Constitutional: He is oriented to person, place, and time. He appears  well-developed. No distress.  Morbidly obese  HENT:  Head: Normocephalic and atraumatic.  Right Ear: External ear normal.  Left Ear: External ear normal.  Nose: Nose normal.  Mouth/Throat: Oropharynx is clear and moist.  Eyes: Conjunctivae and EOM are normal.  Neck: Normal range of motion. Neck supple.  Cardiovascular: Normal rate, regular rhythm and intact distal pulses.   Murmur heard.  Systolic murmur is present with a grade of 2/6  Respiratory: Effort normal and breath sounds normal. No respiratory distress. He has no wheezes.  GI: Soft. Bowel sounds are normal. There is no tenderness.  Significant protuberant abdomen  Musculoskeletal:       Right hip: Normal.       Left hip: Normal.  His left knee shows no effusion. His range is about 10 to 120. There is marked crepitus on range of motion, tenderness medial greater than lateral with no instability. Right knee, no effusion, 0 to 125, no tenderness or instability.  Neurological: He is oriented to person, place, and time. He has normal strength and normal reflexes. No sensory deficit.  Skin: He is not diaphoretic.  Venous stasis noted in bilateral LE with recently healed wound on the medial aspect of the distal let tibia. Nickel sized wound over medial aspect of the right tibia with no active drainage. Wounds are  secondary to PVD.   Psychiatric: He has a normal mood and affect. His behavior is normal.    Vitals  Weight: 325 lb Height: 71in Body Mass Index: 45 Pulse: 76 (Regular)  BP: 142/76 (Sitting, Left Arm, Standard)  Imaging Review Plain radiographs demonstrate severe degenerative joint disease of the left knee(s). The overall alignment ismild varus. The bone quality appears to be good for age and reported activity level.  Assessment/Plan:  End stage primary osteoarthritis, left knee   The patient history, physical examination, clinical judgment of the provider and imaging studies are consistent with end stage degenerative joint disease of the left knee(s) and total knee arthroplasty is deemed medically necessary. The treatment options including medical management, injection therapy arthroscopy and arthroplasty were discussed at length. The risks and benefits of total knee arthroplasty were presented and reviewed. The risks due to aseptic loosening, infection, stiffness, patella tracking problems, thromboembolic complications and other imponderables were discussed. The patient acknowledged the explanation, agreed to proceed with the plan and consent was signed. Patient is being admitted for inpatient treatment for surgery, pain control, PT, OT, prophylactic antibiotics, VTE prophylaxis, progressive ambulation and ADL's and discharge planning. The patient is planning to be discharged to skilled nursing facility ([facility])   PCP: Dr. Betsy Coder Cardio: Dr. Bernadette Hoit, PA-C

## 2015-05-31 ENCOUNTER — Encounter (HOSPITAL_COMMUNITY): Payer: Self-pay

## 2015-05-31 ENCOUNTER — Inpatient Hospital Stay (HOSPITAL_COMMUNITY): Payer: Medicare Other | Admitting: Certified Registered"

## 2015-05-31 ENCOUNTER — Inpatient Hospital Stay (HOSPITAL_COMMUNITY): Payer: Medicare Other

## 2015-05-31 ENCOUNTER — Inpatient Hospital Stay (HOSPITAL_COMMUNITY)
Admission: RE | Admit: 2015-05-31 | Discharge: 2015-06-11 | DRG: 469 | Disposition: A | Discharge status: Skilled Nursing Facility | Payer: Medicare Other | Source: Ambulatory Visit | Attending: Orthopedic Surgery | Admitting: Orthopedic Surgery

## 2015-05-31 ENCOUNTER — Encounter (HOSPITAL_COMMUNITY)
Admission: RE | Disposition: A | Discharge status: Skilled Nursing Facility | Payer: Self-pay | Source: Ambulatory Visit | Attending: Orthopedic Surgery

## 2015-05-31 DIAGNOSIS — E785 Hyperlipidemia, unspecified: Secondary | ICD-10-CM | POA: Diagnosis present

## 2015-05-31 DIAGNOSIS — Z833 Family history of diabetes mellitus: Secondary | ICD-10-CM

## 2015-05-31 DIAGNOSIS — J690 Pneumonitis due to inhalation of food and vomit: Secondary | ICD-10-CM | POA: Diagnosis present

## 2015-05-31 DIAGNOSIS — R001 Bradycardia, unspecified: Secondary | ICD-10-CM | POA: Diagnosis not present

## 2015-05-31 DIAGNOSIS — A419 Sepsis, unspecified organism: Secondary | ICD-10-CM | POA: Diagnosis not present

## 2015-05-31 DIAGNOSIS — Z87891 Personal history of nicotine dependence: Secondary | ICD-10-CM | POA: Diagnosis not present

## 2015-05-31 DIAGNOSIS — I160 Hypertensive urgency: Secondary | ICD-10-CM | POA: Diagnosis present

## 2015-05-31 DIAGNOSIS — R0989 Other specified symptoms and signs involving the circulatory and respiratory systems: Secondary | ICD-10-CM | POA: Diagnosis not present

## 2015-05-31 DIAGNOSIS — Z01812 Encounter for preprocedural laboratory examination: Secondary | ICD-10-CM

## 2015-05-31 DIAGNOSIS — I10 Essential (primary) hypertension: Secondary | ICD-10-CM | POA: Diagnosis not present

## 2015-05-31 DIAGNOSIS — D62 Acute posthemorrhagic anemia: Secondary | ICD-10-CM | POA: Diagnosis not present

## 2015-05-31 DIAGNOSIS — R0600 Dyspnea, unspecified: Secondary | ICD-10-CM

## 2015-05-31 DIAGNOSIS — E1151 Type 2 diabetes mellitus with diabetic peripheral angiopathy without gangrene: Secondary | ICD-10-CM | POA: Diagnosis present

## 2015-05-31 DIAGNOSIS — G4733 Obstructive sleep apnea (adult) (pediatric): Secondary | ICD-10-CM | POA: Diagnosis present

## 2015-05-31 DIAGNOSIS — R0602 Shortness of breath: Secondary | ICD-10-CM

## 2015-05-31 DIAGNOSIS — M1712 Unilateral primary osteoarthritis, left knee: Secondary | ICD-10-CM | POA: Diagnosis present

## 2015-05-31 DIAGNOSIS — J9601 Acute respiratory failure with hypoxia: Secondary | ICD-10-CM | POA: Diagnosis present

## 2015-05-31 DIAGNOSIS — R0689 Other abnormalities of breathing: Secondary | ICD-10-CM

## 2015-05-31 DIAGNOSIS — Z79899 Other long term (current) drug therapy: Secondary | ICD-10-CM

## 2015-05-31 DIAGNOSIS — Z6841 Body Mass Index (BMI) 40.0 and over, adult: Secondary | ICD-10-CM

## 2015-05-31 DIAGNOSIS — M25562 Pain in left knee: Secondary | ICD-10-CM | POA: Diagnosis present

## 2015-05-31 DIAGNOSIS — I83008 Varicose veins of unspecified lower extremity with ulcer other part of lower leg: Secondary | ICD-10-CM | POA: Diagnosis present

## 2015-05-31 DIAGNOSIS — E11622 Type 2 diabetes mellitus with other skin ulcer: Secondary | ICD-10-CM | POA: Diagnosis present

## 2015-05-31 DIAGNOSIS — I5033 Acute on chronic diastolic (congestive) heart failure: Secondary | ICD-10-CM | POA: Insufficient documentation

## 2015-05-31 DIAGNOSIS — I83029 Varicose veins of left lower extremity with ulcer of unspecified site: Secondary | ICD-10-CM

## 2015-05-31 DIAGNOSIS — Z9889 Other specified postprocedural states: Secondary | ICD-10-CM

## 2015-05-31 DIAGNOSIS — L97919 Non-pressure chronic ulcer of unspecified part of right lower leg with unspecified severity: Secondary | ICD-10-CM | POA: Diagnosis present

## 2015-05-31 DIAGNOSIS — I83019 Varicose veins of right lower extremity with ulcer of unspecified site: Secondary | ICD-10-CM | POA: Diagnosis present

## 2015-05-31 DIAGNOSIS — L97929 Non-pressure chronic ulcer of unspecified part of left lower leg with unspecified severity: Secondary | ICD-10-CM

## 2015-05-31 HISTORY — PX: TOTAL KNEE ARTHROPLASTY: SHX125

## 2015-05-31 LAB — CBC WITH DIFFERENTIAL/PLATELET
BASOS PCT: 0 %
Basophils Absolute: 0 10*3/uL (ref 0.0–0.1)
EOS ABS: 0 10*3/uL (ref 0.0–0.7)
Eosinophils Relative: 0 %
HEMATOCRIT: 40.6 % (ref 39.0–52.0)
Hemoglobin: 12.6 g/dL — ABNORMAL LOW (ref 13.0–17.0)
Lymphocytes Relative: 7 %
Lymphs Abs: 1.2 10*3/uL (ref 0.7–4.0)
MCH: 29 pg (ref 26.0–34.0)
MCHC: 31 g/dL (ref 30.0–36.0)
MCV: 93.3 fL (ref 78.0–100.0)
MONO ABS: 0.2 10*3/uL (ref 0.1–1.0)
MONOS PCT: 1 %
Neutro Abs: 15 10*3/uL — ABNORMAL HIGH (ref 1.7–7.7)
Neutrophils Relative %: 92 %
Platelets: 350 10*3/uL (ref 150–400)
RBC: 4.35 MIL/uL (ref 4.22–5.81)
RDW: 14.6 % (ref 11.5–15.5)
WBC: 16.4 10*3/uL — ABNORMAL HIGH (ref 4.0–10.5)

## 2015-05-31 LAB — COMPREHENSIVE METABOLIC PANEL
ALBUMIN: 3.3 g/dL — AB (ref 3.5–5.0)
ALT: 68 U/L — ABNORMAL HIGH (ref 17–63)
ANION GAP: 10 (ref 5–15)
AST: 45 U/L — ABNORMAL HIGH (ref 15–41)
Alkaline Phosphatase: 72 U/L (ref 38–126)
BILIRUBIN TOTAL: 0.6 mg/dL (ref 0.3–1.2)
BUN: 16 mg/dL (ref 6–20)
CO2: 29 mmol/L (ref 22–32)
Calcium: 8.7 mg/dL — ABNORMAL LOW (ref 8.9–10.3)
Chloride: 101 mmol/L (ref 101–111)
Creatinine, Ser: 1.04 mg/dL (ref 0.61–1.24)
GFR calc non Af Amer: >60 mL/min (ref >60)
GLUCOSE: 171 mg/dL — AB (ref 65–99)
POTASSIUM: 3.7 mmol/L (ref 3.5–5.1)
SODIUM: 140 mmol/L (ref 135–145)
TOTAL PROTEIN: 7.3 g/dL (ref 6.5–8.1)

## 2015-05-31 LAB — GLUCOSE, CAPILLARY
GLUCOSE-CAPILLARY: 128 mg/dL — AB (ref 65–99)
GLUCOSE-CAPILLARY: 187 mg/dL — AB (ref 65–99)
GLUCOSE-CAPILLARY: 183 mg/dL — AB (ref 65–99)
GLUCOSE-CAPILLARY: 243 mg/dL — AB (ref 65–99)

## 2015-05-31 LAB — PROCALCITONIN: PROCALCITONIN: 0.13 ng/mL

## 2015-05-31 LAB — BLOOD GAS, ARTERIAL
Acid-Base Excess: 1 mmol/L (ref 0.0–2.0)
BICARBONATE: 27 meq/L — AB (ref 20.0–24.0)
Drawn by: 295031
FIO2: 0.7
LHR: 8 {breaths}/min
O2 SAT: 98 %
PEEP: 8 cmH2O
PH ART: 7.338 — AB (ref 7.350–7.450)
Patient temperature: 98.6
Pressure control: 12 cmH2O
TCO2: 24.5 mmol/L (ref 0–100)
pCO2 arterial: 51.7 mmHg — ABNORMAL HIGH (ref 35.0–45.0)
pO2, Arterial: 153 mmHg — ABNORMAL HIGH (ref 80.0–100.0)

## 2015-05-31 LAB — TYPE AND SCREEN
ABO/RH(D): O POS
Antibody Screen: NEGATIVE

## 2015-05-31 LAB — LACTIC ACID, PLASMA
LACTIC ACID, VENOUS: 1.5 mmol/L (ref 0.5–2.0)
Lactic Acid, Venous: 0.9 mmol/L (ref 0.5–2.0)

## 2015-05-31 LAB — MRSA PCR SCREENING: MRSA by PCR: NEGATIVE

## 2015-05-31 LAB — PROTIME-INR
INR: 1.05 (ref 0.00–1.49)
PROTHROMBIN TIME: 13.9 s (ref 11.6–15.2)

## 2015-05-31 LAB — APTT: aPTT: 32 seconds (ref 24–37)

## 2015-05-31 SURGERY — ARTHROPLASTY, KNEE, TOTAL
Anesthesia: Monitor Anesthesia Care | Site: Knee | Laterality: Left

## 2015-05-31 MED ORDER — SODIUM CHLORIDE 0.9 % IV SOLN: INTRAVENOUS | Status: DC

## 2015-05-31 MED ORDER — 0.9 % SODIUM CHLORIDE (POUR BTL) OPTIME
TOPICAL | Status: DC | PRN
  Administered 2015-05-31: 1000 mL

## 2015-05-31 MED ORDER — ACETAMINOPHEN 10 MG/ML IV SOLN
INTRAVENOUS | Status: AC
  Filled 2015-05-31: qty 100

## 2015-05-31 MED ORDER — DEXTROSE 5 % IV SOLN
2.0000 g | Freq: Three times a day (TID) | INTRAVENOUS | Status: DC
  Administered 2015-05-31 – 2015-06-01 (×3): 2 g via INTRAVENOUS
  Filled 2015-05-31 (×3): qty 2

## 2015-05-31 MED ORDER — INSULIN GLARGINE 100 UNIT/ML SOLOSTAR PEN: 20.0000 [IU] | PEN_INJECTOR | Freq: Every day | SUBCUTANEOUS | Status: DC

## 2015-05-31 MED ORDER — LABETALOL HCL 5 MG/ML IV SOLN
INTRAVENOUS | Status: AC
  Filled 2015-05-31: qty 4

## 2015-05-31 MED ORDER — BISACODYL 10 MG RE SUPP
10.0000 mg | Freq: Every day | RECTAL | Status: DC | PRN
  Administered 2015-06-05: 10 mg via RECTAL
  Filled 2015-05-31: qty 1

## 2015-05-31 MED ORDER — SODIUM CHLORIDE 0.9 % IJ SOLN
INTRAMUSCULAR | Status: DC | PRN
  Administered 2015-05-31: 30 mL

## 2015-05-31 MED ORDER — LABETALOL HCL 5 MG/ML IV SOLN
INTRAVENOUS | Status: DC | PRN
  Administered 2015-05-31 (×2): 5 mg via INTRAVENOUS

## 2015-05-31 MED ORDER — DEXTROSE 5 % IV SOLN
3.0000 g | INTRAVENOUS | Status: AC
  Administered 2015-05-31: 3 g via INTRAVENOUS
  Filled 2015-05-31: qty 3000

## 2015-05-31 MED ORDER — HYDROMORPHONE HCL 1 MG/ML IJ SOLN
INTRAMUSCULAR | Status: AC
  Filled 2015-05-31: qty 1

## 2015-05-31 MED ORDER — ONDANSETRON HCL 4 MG PO TABS: 4.0000 mg | ORAL_TABLET | Freq: Four times a day (QID) | ORAL | Status: DC | PRN

## 2015-05-31 MED ORDER — ACETAMINOPHEN 650 MG RE SUPP: 650.0000 mg | Freq: Four times a day (QID) | RECTAL | Status: DC | PRN

## 2015-05-31 MED ORDER — METOCLOPRAMIDE HCL 5 MG/ML IJ SOLN: 5.0000 mg | Freq: Three times a day (TID) | INTRAMUSCULAR | Status: DC | PRN

## 2015-05-31 MED ORDER — LACTATED RINGERS IV SOLN
INTRAVENOUS | Status: DC
  Administered 2015-05-31: 1000 mL via INTRAVENOUS
  Administered 2015-05-31: 11:00:00 via INTRAVENOUS

## 2015-05-31 MED ORDER — MORPHINE SULFATE (PF) 2 MG/ML IV SOLN
1.0000 mg | INTRAVENOUS | Status: DC | PRN
  Administered 2015-05-31 – 2015-06-01 (×2): 1 mg via INTRAVENOUS
  Filled 2015-05-31 (×2): qty 1

## 2015-05-31 MED ORDER — METHOCARBAMOL 1000 MG/10ML IJ SOLN
500.0000 mg | Freq: Four times a day (QID) | INTRAVENOUS | Status: DC | PRN
  Administered 2015-05-31: 500 mg via INTRAVENOUS
  Filled 2015-05-31 (×2): qty 5

## 2015-05-31 MED ORDER — SODIUM CHLORIDE 0.9 % IJ SOLN
INTRAMUSCULAR | Status: AC
Start: 2015-05-31 — End: 2015-05-31
  Filled 2015-05-31: qty 50

## 2015-05-31 MED ORDER — DEXAMETHASONE SODIUM PHOSPHATE 10 MG/ML IJ SOLN
10.0000 mg | Freq: Once | INTRAMUSCULAR | Status: AC
  Administered 2015-06-01: 10 mg via INTRAVENOUS
  Filled 2015-05-31: qty 1

## 2015-05-31 MED ORDER — SIMVASTATIN 40 MG PO TABS: 40.0000 mg | ORAL_TABLET | Freq: Every day | ORAL | Status: DC

## 2015-05-31 MED ORDER — PROPOFOL 10 MG/ML IV BOLUS
INTRAVENOUS | Status: DC | PRN
  Administered 2015-05-31: 20 mg via INTRAVENOUS
  Administered 2015-05-31: 150 mg via INTRAVENOUS
  Administered 2015-05-31 (×2): 20 mg via INTRAVENOUS

## 2015-05-31 MED ORDER — FENTANYL CITRATE (PF) 100 MCG/2ML IJ SOLN
INTRAMUSCULAR | Status: AC
  Filled 2015-05-31: qty 2

## 2015-05-31 MED ORDER — CHLORHEXIDINE GLUCONATE 4 % EX LIQD: 60.0000 mL | Freq: Once | CUTANEOUS | Status: DC

## 2015-05-31 MED ORDER — FUROSEMIDE 40 MG PO TABS: 40.0000 mg | ORAL_TABLET | Freq: Every day | ORAL | Status: DC | PRN

## 2015-05-31 MED ORDER — BUPIVACAINE HCL (PF) 0.25 % IJ SOLN
INTRAMUSCULAR | Status: AC
  Filled 2015-05-31: qty 30

## 2015-05-31 MED ORDER — FUROSEMIDE 40 MG PO TABS
40.0000 mg | ORAL_TABLET | Freq: Every day | ORAL | Status: DC
  Administered 2015-06-01: 40 mg via ORAL
  Filled 2015-05-31: qty 1

## 2015-05-31 MED ORDER — GLIMEPIRIDE 4 MG PO TABS
4.0000 mg | ORAL_TABLET | Freq: Every day | ORAL | Status: DC
Start: 2015-06-01 — End: 2015-05-31
  Filled 2015-05-31: qty 1

## 2015-05-31 MED ORDER — ONDANSETRON HCL 4 MG/2ML IJ SOLN
INTRAMUSCULAR | Status: DC | PRN
  Administered 2015-05-31: 4 mg via INTRAVENOUS

## 2015-05-31 MED ORDER — BUPIVACAINE LIPOSOME 1.3 % IJ SUSP
20.0000 mL | Freq: Once | INTRAMUSCULAR | Status: DC
  Filled 2015-05-31: qty 20

## 2015-05-31 MED ORDER — TRAMADOL HCL 50 MG PO TABS: 50.0000 mg | ORAL_TABLET | Freq: Four times a day (QID) | ORAL | Status: DC | PRN

## 2015-05-31 MED ORDER — EPHEDRINE SULFATE 50 MG/ML IJ SOLN
INTRAMUSCULAR | Status: AC
  Filled 2015-05-31: qty 1

## 2015-05-31 MED ORDER — ACETAMINOPHEN 325 MG PO TABS: 650.0000 mg | ORAL_TABLET | Freq: Four times a day (QID) | ORAL | Status: DC | PRN

## 2015-05-31 MED ORDER — OXYCODONE HCL 5 MG PO TABS: 5.0000 mg | ORAL_TABLET | Freq: Once | ORAL | Status: DC | PRN

## 2015-05-31 MED ORDER — ALBUTEROL SULFATE (2.5 MG/3ML) 0.083% IN NEBU
INHALATION_SOLUTION | RESPIRATORY_TRACT | Status: AC
  Administered 2015-05-31: 2.5 mg
  Filled 2015-05-31: qty 3

## 2015-05-31 MED ORDER — INSULIN DETEMIR 100 UNIT/ML ~~LOC~~ SOLN
20.0000 [IU] | Freq: Every day | SUBCUTANEOUS | Status: DC
  Administered 2015-06-01 – 2015-06-10 (×10): 20 [IU] via SUBCUTANEOUS
  Filled 2015-05-31 (×12): qty 0.2

## 2015-05-31 MED ORDER — VANCOMYCIN HCL 10 G IV SOLR
2500.0000 mg | Freq: Once | INTRAVENOUS | Status: AC
  Administered 2015-05-31: 2500 mg via INTRAVENOUS
  Filled 2015-05-31: qty 2000

## 2015-05-31 MED ORDER — DILTIAZEM HCL ER COATED BEADS 180 MG PO CP24
360.0000 mg | ORAL_CAPSULE | Freq: Every day | ORAL | Status: DC
  Administered 2015-06-01 – 2015-06-11 (×11): 360 mg via ORAL
  Filled 2015-05-31 (×11): qty 2

## 2015-05-31 MED ORDER — OXYBUTYNIN CHLORIDE 5 MG PO TABS
5.0000 mg | ORAL_TABLET | Freq: Two times a day (BID) | ORAL | Status: DC
  Administered 2015-05-31 – 2015-06-11 (×22): 5 mg via ORAL
  Filled 2015-05-31 (×25): qty 1

## 2015-05-31 MED ORDER — PHENOL 1.4 % MT LIQD: 1.0000 | OROMUCOSAL | Status: DC | PRN

## 2015-05-31 MED ORDER — HYDROMORPHONE HCL 2 MG/ML IJ SOLN
INTRAMUSCULAR | Status: AC
  Filled 2015-05-31: qty 1

## 2015-05-31 MED ORDER — DIAZEPAM 5 MG PO TABS
5.0000 mg | ORAL_TABLET | Freq: Two times a day (BID) | ORAL | Status: DC
  Administered 2015-05-31 – 2015-06-11 (×22): 5 mg via ORAL
  Filled 2015-05-31 (×22): qty 1

## 2015-05-31 MED ORDER — HYDRALAZINE HCL 20 MG/ML IJ SOLN
10.0000 mg | INTRAMUSCULAR | Status: DC | PRN
  Administered 2015-05-31 – 2015-06-10 (×6): 10 mg via INTRAVENOUS
  Filled 2015-05-31 (×7): qty 1

## 2015-05-31 MED ORDER — HYDRALAZINE HCL 10 MG PO TABS
10.0000 mg | ORAL_TABLET | Freq: Three times a day (TID) | ORAL | Status: DC
  Administered 2015-05-31 – 2015-06-01 (×3): 10 mg via ORAL
  Filled 2015-05-31 (×4): qty 1

## 2015-05-31 MED ORDER — SUCCINYLCHOLINE CHLORIDE 20 MG/ML IJ SOLN
INTRAMUSCULAR | Status: DC | PRN
  Administered 2015-05-31: 140 mg via INTRAVENOUS

## 2015-05-31 MED ORDER — CARVEDILOL 12.5 MG PO TABS: 12.5000 mg | ORAL_TABLET | Freq: Two times a day (BID) | ORAL | Status: DC

## 2015-05-31 MED ORDER — HYDROMORPHONE HCL 1 MG/ML IJ SOLN
0.2500 mg | INTRAMUSCULAR | Status: DC | PRN
  Administered 2015-05-31 (×2): 0.25 mg via INTRAVENOUS

## 2015-05-31 MED ORDER — ONDANSETRON HCL 4 MG/2ML IJ SOLN
INTRAMUSCULAR | Status: AC
  Filled 2015-05-31: qty 2

## 2015-05-31 MED ORDER — APIXABAN 2.5 MG PO TABS
2.5000 mg | ORAL_TABLET | Freq: Two times a day (BID) | ORAL | Status: DC
  Administered 2015-06-01 – 2015-06-11 (×21): 2.5 mg via ORAL
  Filled 2015-05-31 (×21): qty 1

## 2015-05-31 MED ORDER — FLEET ENEMA 7-19 GM/118ML RE ENEM
1.0000 | ENEMA | Freq: Once | RECTAL | Status: AC | PRN
  Administered 2015-06-06: 1 via RECTAL
  Filled 2015-05-31 (×2): qty 1

## 2015-05-31 MED ORDER — ACETAMINOPHEN 500 MG PO TABS
1000.0000 mg | ORAL_TABLET | Freq: Four times a day (QID) | ORAL | Status: AC
  Administered 2015-05-31 – 2015-06-01 (×4): 1000 mg via ORAL
  Filled 2015-05-31 (×4): qty 2

## 2015-05-31 MED ORDER — HYDROMORPHONE HCL 1 MG/ML IJ SOLN
INTRAMUSCULAR | Status: DC | PRN
  Administered 2015-05-31 (×2): 1 mg via INTRAVENOUS

## 2015-05-31 MED ORDER — POTASSIUM CHLORIDE IN NACL 20-0.9 MEQ/L-% IV SOLN
INTRAVENOUS | Status: AC
  Administered 2015-05-31: 10 mL/h via INTRAVENOUS
  Administered 2015-06-06 (×2): via INTRAVENOUS
  Filled 2015-05-31 (×5): qty 1000

## 2015-05-31 MED ORDER — GABAPENTIN 300 MG PO CAPS
300.0000 mg | ORAL_CAPSULE | Freq: Two times a day (BID) | ORAL | Status: DC
  Administered 2015-05-31 – 2015-06-11 (×22): 300 mg via ORAL
  Filled 2015-05-31 (×23): qty 1

## 2015-05-31 MED ORDER — DEXAMETHASONE SODIUM PHOSPHATE 10 MG/ML IJ SOLN
INTRAMUSCULAR | Status: AC
  Filled 2015-05-31: qty 1

## 2015-05-31 MED ORDER — METOCLOPRAMIDE HCL 5 MG PO TABS
5.0000 mg | ORAL_TABLET | Freq: Three times a day (TID) | ORAL | Status: DC | PRN
  Filled 2015-05-31: qty 2

## 2015-05-31 MED ORDER — ATORVASTATIN CALCIUM 20 MG PO TABS
20.0000 mg | ORAL_TABLET | Freq: Every day | ORAL | Status: DC
  Administered 2015-05-31 – 2015-06-10 (×11): 20 mg via ORAL
  Filled 2015-05-31 (×2): qty 2
  Filled 2015-05-31: qty 1
  Filled 2015-05-31 (×2): qty 2
  Filled 2015-05-31 (×7): qty 1

## 2015-05-31 MED ORDER — MIDAZOLAM HCL 2 MG/2ML IJ SOLN
INTRAMUSCULAR | Status: AC
  Filled 2015-05-31: qty 2

## 2015-05-31 MED ORDER — ACETAMINOPHEN 325 MG PO TABS
650.0000 mg | ORAL_TABLET | Freq: Four times a day (QID) | ORAL | Status: DC | PRN
  Administered 2015-06-05 – 2015-06-06 (×2): 650 mg via ORAL
  Filled 2015-05-31 (×2): qty 2

## 2015-05-31 MED ORDER — METHOCARBAMOL 500 MG PO TABS
500.0000 mg | ORAL_TABLET | Freq: Four times a day (QID) | ORAL | Status: DC | PRN
  Administered 2015-06-01 – 2015-06-10 (×8): 500 mg via ORAL
  Filled 2015-05-31 (×8): qty 1

## 2015-05-31 MED ORDER — ACETAMINOPHEN 10 MG/ML IV SOLN
1000.0000 mg | Freq: Once | INTRAVENOUS | Status: AC
  Administered 2015-05-31: 1000 mg via INTRAVENOUS

## 2015-05-31 MED ORDER — VANCOMYCIN HCL 10 G IV SOLR
1250.0000 mg | Freq: Two times a day (BID) | INTRAVENOUS | Status: DC
Start: 2015-06-01 — End: 2015-06-01
  Administered 2015-06-01: 1250 mg via INTRAVENOUS
  Filled 2015-05-31: qty 1250

## 2015-05-31 MED ORDER — MENTHOL 3 MG MT LOZG: 1.0000 | LOZENGE | OROMUCOSAL | Status: DC | PRN

## 2015-05-31 MED ORDER — OXYCODONE HCL 5 MG/5ML PO SOLN
5.0000 mg | Freq: Once | ORAL | Status: DC | PRN
  Filled 2015-05-31: qty 5

## 2015-05-31 MED ORDER — TRANEXAMIC ACID 1000 MG/10ML IV SOLN
1000.0000 mg | INTRAVENOUS | Status: AC
  Administered 2015-05-31: 1000 mg via INTRAVENOUS
  Filled 2015-05-31: qty 10

## 2015-05-31 MED ORDER — ALBUTEROL SULFATE (2.5 MG/3ML) 0.083% IN NEBU: 3.0000 mL | INHALATION_SOLUTION | RESPIRATORY_TRACT | Status: DC | PRN

## 2015-05-31 MED ORDER — PROPOFOL 10 MG/ML IV BOLUS
INTRAVENOUS | Status: AC
  Filled 2015-05-31: qty 40

## 2015-05-31 MED ORDER — OXYCODONE HCL 5 MG PO TABS
5.0000 mg | ORAL_TABLET | ORAL | Status: DC | PRN
  Administered 2015-05-31: 5 mg via ORAL
  Administered 2015-05-31: 10 mg via ORAL
  Administered 2015-06-01: 5 mg via ORAL
  Administered 2015-06-01 – 2015-06-04 (×16): 10 mg via ORAL
  Administered 2015-06-04: 5 mg via ORAL
  Administered 2015-06-05 (×2): 10 mg via ORAL
  Administered 2015-06-05: 5 mg via ORAL
  Administered 2015-06-05 (×2): 10 mg via ORAL
  Administered 2015-06-06: 5 mg via ORAL
  Administered 2015-06-06: 10 mg via ORAL
  Administered 2015-06-06: 5 mg via ORAL
  Administered 2015-06-06 – 2015-06-07 (×2): 10 mg via ORAL
  Administered 2015-06-07: 5 mg via ORAL
  Administered 2015-06-07 – 2015-06-10 (×9): 10 mg via ORAL
  Administered 2015-06-10: 5 mg via ORAL
  Administered 2015-06-10 – 2015-06-11 (×2): 10 mg via ORAL
  Filled 2015-05-31 (×11): qty 2
  Filled 2015-05-31: qty 1
  Filled 2015-05-31 (×4): qty 2
  Filled 2015-05-31: qty 1
  Filled 2015-05-31 (×2): qty 2
  Filled 2015-05-31: qty 1
  Filled 2015-05-31 (×4): qty 2
  Filled 2015-05-31: qty 1
  Filled 2015-05-31 (×5): qty 2
  Filled 2015-05-31 (×2): qty 1
  Filled 2015-05-31: qty 2
  Filled 2015-05-31: qty 1
  Filled 2015-05-31 (×3): qty 2
  Filled 2015-05-31: qty 1
  Filled 2015-05-31 (×5): qty 2

## 2015-05-31 MED ORDER — INSULIN ASPART 100 UNIT/ML ~~LOC~~ SOLN
0.0000 [IU] | Freq: Three times a day (TID) | SUBCUTANEOUS | Status: DC
  Administered 2015-05-31 – 2015-06-01 (×2): 3 [IU] via SUBCUTANEOUS
  Administered 2015-06-01 (×2): 5 [IU] via SUBCUTANEOUS
  Administered 2015-06-02: 8 [IU] via SUBCUTANEOUS
  Administered 2015-06-02: 5 [IU] via SUBCUTANEOUS
  Administered 2015-06-02: 3 [IU] via SUBCUTANEOUS
  Administered 2015-06-03: 5 [IU] via SUBCUTANEOUS
  Administered 2015-06-03: 2 [IU] via SUBCUTANEOUS
  Administered 2015-06-03: 5 [IU] via SUBCUTANEOUS
  Administered 2015-06-04: 3 [IU] via SUBCUTANEOUS
  Administered 2015-06-04: 2 [IU] via SUBCUTANEOUS
  Administered 2015-06-04: 5 [IU] via SUBCUTANEOUS
  Administered 2015-06-05: 3 [IU] via SUBCUTANEOUS
  Administered 2015-06-05: 2 [IU] via SUBCUTANEOUS
  Administered 2015-06-05: 3 [IU] via SUBCUTANEOUS
  Administered 2015-06-06: 2 [IU] via SUBCUTANEOUS
  Administered 2015-06-06 – 2015-06-07 (×3): 3 [IU] via SUBCUTANEOUS
  Administered 2015-06-07: 5 [IU] via SUBCUTANEOUS
  Administered 2015-06-07: 2 [IU] via SUBCUTANEOUS
  Administered 2015-06-08 (×2): 3 [IU] via SUBCUTANEOUS
  Administered 2015-06-08: 8 [IU] via SUBCUTANEOUS
  Administered 2015-06-09: 2 [IU] via SUBCUTANEOUS
  Administered 2015-06-09: 3 [IU] via SUBCUTANEOUS
  Administered 2015-06-09: 5 [IU] via SUBCUTANEOUS
  Administered 2015-06-10: 2 [IU] via SUBCUTANEOUS
  Administered 2015-06-10 (×2): 5 [IU] via SUBCUTANEOUS
  Administered 2015-06-11 (×2): 3 [IU] via SUBCUTANEOUS

## 2015-05-31 MED ORDER — MIDAZOLAM HCL 5 MG/5ML IJ SOLN
INTRAMUSCULAR | Status: DC | PRN
  Administered 2015-05-31: 2 mg via INTRAVENOUS

## 2015-05-31 MED ORDER — FUROSEMIDE 10 MG/ML IJ SOLN
INTRAMUSCULAR | Status: AC
  Administered 2015-05-31: 40 mg
  Filled 2015-05-31: qty 4

## 2015-05-31 MED ORDER — NITROGLYCERIN IN D5W 200-5 MCG/ML-% IV SOLN
0.0000 ug/min | INTRAVENOUS | Status: DC
  Administered 2015-05-31: 5 ug/min via INTRAVENOUS
  Administered 2015-06-01 (×2): 90 ug/min via INTRAVENOUS
  Administered 2015-06-02 (×2): 100 ug/min via INTRAVENOUS
  Administered 2015-06-03: 80 ug/min via INTRAVENOUS
  Administered 2015-06-03: 20 ug/min via INTRAVENOUS
  Filled 2015-05-31 (×6): qty 250

## 2015-05-31 MED ORDER — EPHEDRINE SULFATE 50 MG/ML IJ SOLN
INTRAMUSCULAR | Status: DC | PRN
  Administered 2015-05-31: 10 mg via INTRAVENOUS

## 2015-05-31 MED ORDER — BUPIVACAINE HCL 0.25 % IJ SOLN
INTRAMUSCULAR | Status: DC | PRN
  Administered 2015-05-31: 20 mL

## 2015-05-31 MED ORDER — SODIUM CHLORIDE 0.9 % IR SOLN
Status: DC | PRN
  Administered 2015-05-31: 1000 mL

## 2015-05-31 MED ORDER — DOCUSATE SODIUM 100 MG PO CAPS
100.0000 mg | ORAL_CAPSULE | Freq: Two times a day (BID) | ORAL | Status: DC
  Administered 2015-06-01 – 2015-06-11 (×21): 100 mg via ORAL
  Filled 2015-05-31 (×26): qty 1

## 2015-05-31 MED ORDER — BUPIVACAINE LIPOSOME 1.3 % IJ SUSP
INTRAMUSCULAR | Status: DC | PRN
  Administered 2015-05-31: 20 mL

## 2015-05-31 MED ORDER — SODIUM CHLORIDE 0.9 % IJ SOLN
INTRAMUSCULAR | Status: AC
  Filled 2015-05-31: qty 10

## 2015-05-31 MED ORDER — FENTANYL CITRATE (PF) 100 MCG/2ML IJ SOLN
INTRAMUSCULAR | Status: DC | PRN
  Administered 2015-05-31 (×3): 50 ug via INTRAVENOUS

## 2015-05-31 MED ORDER — PROMETHAZINE HCL 25 MG/ML IJ SOLN: 6.2500 mg | INTRAMUSCULAR | Status: DC | PRN

## 2015-05-31 MED ORDER — DEXAMETHASONE SODIUM PHOSPHATE 10 MG/ML IJ SOLN
10.0000 mg | Freq: Once | INTRAMUSCULAR | Status: AC
Start: 2015-05-31 — End: 2015-05-31
  Administered 2015-05-31: 10 mg via INTRAVENOUS

## 2015-05-31 MED ORDER — METFORMIN HCL 500 MG PO TABS
1000.0000 mg | ORAL_TABLET | Freq: Two times a day (BID) | ORAL | Status: DC
  Filled 2015-05-31: qty 2

## 2015-05-31 MED ORDER — DIPHENHYDRAMINE HCL 12.5 MG/5ML PO ELIX: 12.5000 mg | ORAL_SOLUTION | ORAL | Status: DC | PRN

## 2015-05-31 MED ORDER — CEFAZOLIN SODIUM-DEXTROSE 2-3 GM-% IV SOLR: 2.0000 g | Freq: Four times a day (QID) | INTRAVENOUS | Status: DC

## 2015-05-31 MED ORDER — KETOROLAC TROMETHAMINE 30 MG/ML IJ SOLN: 30.0000 mg | Freq: Once | INTRAMUSCULAR | Status: DC

## 2015-05-31 MED ORDER — POLYETHYLENE GLYCOL 3350 17 G PO PACK
17.0000 g | PACK | Freq: Every day | ORAL | Status: DC | PRN
  Administered 2015-06-03 – 2015-06-09 (×6): 17 g via ORAL
  Filled 2015-05-31 (×6): qty 1

## 2015-05-31 MED ORDER — ONDANSETRON HCL 4 MG/2ML IJ SOLN: 4.0000 mg | Freq: Four times a day (QID) | INTRAMUSCULAR | Status: DC | PRN

## 2015-05-31 SURGICAL SUPPLY — 58 items
BAG DECANTER FOR FLEXI CONT (MISCELLANEOUS) IMPLANT
BAG ZIPLOCK 12X15 (MISCELLANEOUS) ×3 IMPLANT
BANDAGE ACE 6X5 VEL STRL LF (GAUZE/BANDAGES/DRESSINGS) ×3 IMPLANT
BANDAGE ELASTIC 6 VELCRO ST LF (GAUZE/BANDAGES/DRESSINGS) ×3 IMPLANT
BLADE SAG 18X100X1.27 (BLADE) ×3 IMPLANT
BLADE SAW SGTL 11.0X1.19X90.0M (BLADE) ×3 IMPLANT
BOWL SMART MIX CTS (DISPOSABLE) ×3 IMPLANT
CAP KNEE TOTAL 3 SIGMA ×3 IMPLANT
CEMENT HV SMART SET (Cement) ×6 IMPLANT
CLOSURE WOUND 1/2 X4 (GAUZE/BANDAGES/DRESSINGS) ×1
CLOTH BEACON ORANGE TIMEOUT ST (SAFETY) ×3 IMPLANT
CUFF TOURN SGL QUICK 34 (TOURNIQUET CUFF) ×2
CUFF TRNQT CYL 34X4X40X1 (TOURNIQUET CUFF) ×1 IMPLANT
DECANTER SPIKE VIAL GLASS SM (MISCELLANEOUS) ×3 IMPLANT
DRAPE SHEET LG 3/4 BI-LAMINATE (DRAPES) ×3 IMPLANT
DRAPE U-SHAPE 47X51 STRL (DRAPES) ×3 IMPLANT
DRSG ADAPTIC 3X8 NADH LF (GAUZE/BANDAGES/DRESSINGS) ×3 IMPLANT
DRSG PAD ABDOMINAL 8X10 ST (GAUZE/BANDAGES/DRESSINGS) ×3 IMPLANT
DURAPREP 26ML APPLICATOR (WOUND CARE) ×3 IMPLANT
ELECT REM PT RETURN 9FT ADLT (ELECTROSURGICAL) ×3
ELECTRODE REM PT RTRN 9FT ADLT (ELECTROSURGICAL) ×1 IMPLANT
EVACUATOR 1/8 PVC DRAIN (DRAIN) ×3 IMPLANT
GAUZE SPONGE 4X4 12PLY STRL (GAUZE/BANDAGES/DRESSINGS) ×3 IMPLANT
GLOVE BIO SURGEON STRL SZ7.5 (GLOVE) ×9 IMPLANT
GLOVE BIO SURGEON STRL SZ8 (GLOVE) ×3 IMPLANT
GLOVE BIOGEL PI IND STRL 6.5 (GLOVE) IMPLANT
GLOVE BIOGEL PI IND STRL 7.0 (GLOVE) ×2 IMPLANT
GLOVE BIOGEL PI IND STRL 7.5 (GLOVE) ×1 IMPLANT
GLOVE BIOGEL PI IND STRL 8 (GLOVE) ×3 IMPLANT
GLOVE BIOGEL PI INDICATOR 6.5 (GLOVE)
GLOVE BIOGEL PI INDICATOR 7.0 (GLOVE) ×4
GLOVE BIOGEL PI INDICATOR 7.5 (GLOVE) ×2
GLOVE BIOGEL PI INDICATOR 8 (GLOVE) ×6
GLOVE SURG SS PI 6.5 STRL IVOR (GLOVE) IMPLANT
GLOVE SURG SS PI 7.0 STRL IVOR (GLOVE) ×3 IMPLANT
GLOVE SURG SS PI 8.0 STRL IVOR (GLOVE) ×3 IMPLANT
GOWN STRL REUS W/TWL LRG LVL3 (GOWN DISPOSABLE) ×9 IMPLANT
GOWN STRL REUS W/TWL XL LVL3 (GOWN DISPOSABLE) ×6 IMPLANT
HANDPIECE INTERPULSE COAX TIP (DISPOSABLE) ×2
IMMOBILIZER KNEE 20 (SOFTGOODS) ×6 IMPLANT
IMMOBILIZER KNEE 20 THIGH 36 (SOFTGOODS) ×1 IMPLANT
MANIFOLD NEPTUNE II (INSTRUMENTS) ×3 IMPLANT
NS IRRIG 1000ML POUR BTL (IV SOLUTION) ×3 IMPLANT
PACK TOTAL KNEE CUSTOM (KITS) ×3 IMPLANT
PADDING CAST COTTON 6X4 STRL (CAST SUPPLIES) ×3 IMPLANT
POSITIONER SURGICAL ARM (MISCELLANEOUS) ×3 IMPLANT
SET HNDPC FAN SPRY TIP SCT (DISPOSABLE) ×1 IMPLANT
STRIP CLOSURE SKIN 1/2X4 (GAUZE/BANDAGES/DRESSINGS) ×2 IMPLANT
SUT MNCRL AB 4-0 PS2 18 (SUTURE) ×3 IMPLANT
SUT VIC AB 2-0 CT1 27 (SUTURE) ×6
SUT VIC AB 2-0 CT1 TAPERPNT 27 (SUTURE) ×3 IMPLANT
SUT VLOC 180 0 24IN GS25 (SUTURE) ×3 IMPLANT
SYR 50ML LL SCALE MARK (SYRINGE) ×3 IMPLANT
TRAY FOLEY W/METER SILVER 14FR (SET/KITS/TRAYS/PACK) IMPLANT
TRAY FOLEY W/METER SILVER 16FR (SET/KITS/TRAYS/PACK) ×3 IMPLANT
WATER STERILE IRR 1500ML POUR (IV SOLUTION) ×3 IMPLANT
WRAP KNEE MAXI GEL POST OP (GAUZE/BANDAGES/DRESSINGS) ×3 IMPLANT
YANKAUER SUCT BULB TIP 10FT TU (MISCELLANEOUS) ×3 IMPLANT

## 2015-05-31 NOTE — Progress Notes (Signed)
Cpap, lasix 40 mg IV, and albuterol tx per Dr Benjamine Mola

## 2015-05-31 NOTE — Progress Notes (Signed)
Utilization review completed.  

## 2015-05-31 NOTE — Progress Notes (Signed)
Wife has CPAP Mask and Hose

## 2015-05-31 NOTE — Anesthesia Postprocedure Evaluation (Signed)
Anesthesia Post Note  Patient: Mario Mills  Procedure(s) Performed: Procedure(s) (LRB): TOTAL LEFT  KNEE ARTHROPLASTY (Left)  Patient location during evaluation: PACU Anesthesia Type: General Level of consciousness: awake and alert Pain management: pain level controlled Vital Signs Assessment: post-procedure vital signs reviewed and stable Respiratory status: spontaneous breathing (Pt connected to BiPAP machine) Cardiovascular status: blood pressure returned to baseline Anesthetic complications: no (Pt with pulmonary edema noted in PACU. Sats in mid 80's with bilateral rales and ronchi. Improved with BiPAP, lasix and nebulizer tx. Pt to Stepdown/ICU for further monitoring.)    Last Vitals:  Filed Vitals:   05/31/15 1455 05/31/15 1635  BP:    Pulse: 49   Temp:  36.2 C  Resp: 12     Last Pain:  Filed Vitals:   05/31/15 1640  PainSc: 5                  Tiajuana Amass

## 2015-05-31 NOTE — Transfer of Care (Signed)
Immediate Anesthesia Transfer of Care Note  Patient: Mario Mills  Procedure(s) Performed: Procedure(s): TOTAL LEFT  KNEE ARTHROPLASTY (Left)  Patient Location: PACU  Anesthesia Type:General  Level of Consciousness:  sedated, patient cooperative and responds to stimulation  Airway & Oxygen Therapy:Patient Spontanous Breathing and Patient connected to face mask oxgen  Post-op Assessment:  Report given to PACU RN and Post -op Vital signs reviewed and stable  Post vital signs:  Reviewed and stable  Last Vitals:  Filed Vitals:   05/31/15 1127 05/31/15 1130  BP: 194/94   Pulse:  64  Temp:    Resp:  18    Complications: No apparent anesthesia complications

## 2015-05-31 NOTE — Progress Notes (Signed)
Cough is non-productive and infrequent.  Patient is unable to produce a sputum specimen at this time.

## 2015-05-31 NOTE — Progress Notes (Signed)
ANTIBIOTIC CONSULT NOTE - INITIAL  Pharmacy Consult for vancomycin, adjust cefepime Indication: pneumonia  No Known Allergies  Patient Measurements: Height: 5' 11.5" (181.6 cm) Weight: (!) 325 lb 11 oz (147.731 kg) IBW/kg (Calculated) : 76.45  Vital Signs: Temp: 97.5 F (36.4 C) (01/13 1445) Temp Source: Oral (01/13 0714) BP: 150/78 mmHg (01/13 1445) Pulse Rate: 49 (01/13 1455) Intake/Output from previous day:   Intake/Output from this shift: Total I/O In: 1915 [I.V.:1700; IV Piggyback:215] Out: 1125 [Urine:1125]  Labs: No results for input(s): WBC, HGB, PLT, LABCREA, CREATININE in the last 72 hours. Estimated Creatinine Clearance: 99 mL/min (by C-G formula based on Cr of 1.09). No results for input(s): VANCOTROUGH, VANCOPEAK, VANCORANDOM, GENTTROUGH, GENTPEAK, GENTRANDOM, TOBRATROUGH, TOBRAPEAK, TOBRARND, AMIKACINPEAK, AMIKACINTROU, AMIKACIN in the last 72 hours.   Microbiology: Recent Results (from the past 720 hour(s))  Surgical pcr screen     Status: Abnormal   Collection Time: 05/24/15  4:34 PM  Result Value Ref Range Status   MRSA, PCR NEGATIVE NEGATIVE Final   Staphylococcus aureus POSITIVE (A) NEGATIVE Final    Comment:        The Xpert SA Assay (FDA approved for NASAL specimens in patients over 49 years of age), is one component of a comprehensive surveillance program.  Test performance has been validated by [facility] for patients greater than or equal to 36 year old. It is not intended to diagnose infection nor to guide or monitor treatment.     Medical History: Past Medical History  Diagnosis Date  . Hypertension   . Diabetes mellitus   . Hyperlipidemia   . Obstructive sleep apnea     CPAP  . Peripheral arterial disease ([place])   . Venous insufficiency   . Venous stasis ulcer ([place])   . DOE (dyspnea on exertion)     WITH MINIMAL EXERTION  . HA (headache)     WAKE UP FREQUENTLY WITH HA  . History of Doppler ultrasound 12/2012    LE VENOUS  REFLUX demostrated on L (vein stripping on R w/MRSA); h/o venous stasis bilat   . Atrial fibrillation ([place])   . Heart murmur   . Dysrhythmia   . History of urinary tract infection   . Urinary incontinence   . Arthritis   . Numbness and tingling     feet bilat   . Falls     Medications:  Anti-infectives    Start     Dose/Rate Route Frequency Ordered Stop   06/01/15 0600  vancomycin (VANCOCIN) 1,250 mg in sodium chloride 0.9 % 250 mL IVPB     1,250 mg 166.7 mL/hr over 90 Minutes Intravenous Every 12 hours 05/31/15 1604     05/31/15 1700  vancomycin (VANCOCIN) 2,500 mg in sodium chloride 0.9 % 500 mL IVPB     2,500 mg 250 mL/hr over 120 Minutes Intravenous  Once 05/31/15 1604     05/31/15 1600  ceFAZolin (ANCEF) IVPB 2 g/50 mL premix  Status:  Discontinued     2 g 100 mL/hr over 30 Minutes Intravenous Every 6 hours 05/31/15 1526 05/31/15 1600   05/31/15 1600  ceFEPIme (MAXIPIME) 2 g in dextrose 5 % 50 mL IVPB     2 g 100 mL/hr over 30 Minutes Intravenous 3 times per day 05/31/15 1550 06/08/15 1359   05/31/15 0730  ceFAZolin (ANCEF) 3 g in dextrose 5 % 50 mL IVPB     3 g 130 mL/hr over 30 Minutes Intravenous On call to O.R. 05/31/15 0719 05/31/15  0946      Assessment: 65 y.o. obese male admitted 05/31/2015 for L TKA.  No surgical complications; however postop CXR shows bilateral patchy airspace disease highly suspicious for bilat PNA vs. edema.  To begin vanc per pharmacy and renally adjust cefepime.  Currently on Ancef postop.  1/13 >> Ancef >> 1/13 1/13 >> cefepime >>   1/13 >> vancomycin >>  1/13 blood: ordered 1/13 sputum: ordered  Afebrile WBC pending Renal: SCr wnl; baseline unknown; CrCl 67 N Brief drop in PaO2 postop, recovered on BiPAP  No results found for: Pandora Leiter  Goal of Therapy:  Vancomycin trough level 15-20 mcg/ml  Eradication of infection Appropriate antibiotic dosing for indication and renal function  Plan:  Day 1  antibiotics Discontinue Ancef Vancomycin 2500 mg IV now, then 1250 mg IV q12 hr Measure vancomycin trough levels at steady state as indicated Increased Cefepime to 2g IV q8 hr d/t weight  Follow clinical course, renal function, culture results as available  Follow for de-escalation of antibiotics and LOT   Reuel Boom, PharmD, BCPS Pager: [phone] 05/31/2015, 3:56 PM

## 2015-05-31 NOTE — Consult Note (Signed)
[facility] Medical Consultation  Mario Mills [id number] DOB: 09/16/1950 DOA: 05/31/2015 PCP: Philis Fendt, MD   Requesting physician: Dr. Wynelle Link  Date of consultation: 05/31/2015 Reason for consultation: hypoxia post op requiring placement on BiPAP  For ortho team: Please note that pt with multiple chronic medical conditions, reasonable for internal medical team to take over as the attending team. If you agree, please let us know so we can take over and we would greatly appreciate your assistance as the consulting team.   Cell [phone]  Dr. Doyle Askew  Impression/Recommendations  Principal Problem:   Acute respiratory failure with hypoxia ([place]) - initially in BiPAP post op and currently saturating well on 6 L via [place] - this appears to be secondary to bilateral PNA as per CXR, pt currently unable to provide history so not clear if he is truly symptomatic c/w PNA - will place on broad spectrum ABX for now and treat conservatively, narrow down as soon as possible if no clear sings of an infectious etiology  - keep in SDU for now  Active Problems:   Hypertensive urgency - pt on Coreg, Cardizem, lasix, lisinopril - HCTZ at home  - will have to hold Coreg due to bradycardia - also added Hydralazine as needed and scheduled dosing - if BP still > 160/90, consider placing on Nitro drip     S/P left knee arthroscopy - per ortho team     Sepsis, unspecified organism ([place]) - sepsis order set in place, possibly related to bilateral PNA - follow up on lactic acid - placed on broad spectrum ABX for now and narrow down as clinically indicated  - follow up on blood cultures     Bradycardia with 41 - 50 beats per minute - hold coreg for now until HR stabilizes     DM (diabetes mellitus) type II controlled peripheral vascular disorder ([place]) - continue home medical regimen with insulin - hold oral antihyperglycemic regimen for now    Morbid obesity due to excess calories  ([place]) - Body mass index is 44.8 kg/(m^2).  I will followup again tomorrow. Please contact me if I can be of assistance in the meanwhile. Thank you for this consultation.  Chief Complaint: hypoxia post op  HPI:  65 y.o. year old male with end stage OA of his left knee with progressively worsening pain and dysfunction, admitted for left Total Knee Arthroplasty. Post op hypoxic and placed on BiPAP, TRH asked to assist. Pt is on BiPAP, unable to provide history. VS notable for HR in 40's, Temp stable, CXR with PNA. Agree with transfer to SDU.    Review of Systems:  Unable to obtain, pt somnolent and on BiPAP  Past Medical History  Diagnosis Date  . Hypertension   . Diabetes mellitus   . Hyperlipidemia   . Obstructive sleep apnea     CPAP  . Peripheral arterial disease ([place])   . Venous insufficiency   . Venous stasis ulcer ([place])   . DOE (dyspnea on exertion)     WITH MINIMAL EXERTION  . HA (headache)     WAKE UP FREQUENTLY WITH HA  . History of Doppler ultrasound 12/2012    LE VENOUS REFLUX demostrated on L (vein stripping on R w/MRSA); h/o venous stasis bilat   . Atrial fibrillation ([place])   . Heart murmur   . Dysrhythmia   . History of urinary tract infection   . Urinary incontinence   . Arthritis   . Numbness and tingling  feet bilat   . Falls    Past Surgical History  Procedure Laterality Date  . Colon resection  2007  . Transthoracic echocardiogram  07/02/2005    mod concentric LVH; LV borderline dilated; LA mild-mod dilated; mild MR & TR  . Varicose vein surgery      R leg  . Colon surgery    . Tonsillectomy     Social History:  reports that he quit smoking about 9 years ago. His smoking use included Cigarettes. He has a 35 pack-year smoking history. He has never used smokeless tobacco. He reports that he drinks alcohol. He reports that he does not use illicit drugs.  No Known Allergies Family History  Problem Relation Age of Onset  . Pancreatic cancer Father    . Cancer Father   . Heart Problems Mother   . Hypertension Mother   . Diabetes Brother   . Hypertension Brother     Medication Sig  albuterol (PROVENTIL HFA;VENTOLIN HFA) 108 (90 BASE) MCG/ACT inhaler Inhale 2 puffs into the lungs every 4 (four) hours as needed for wheezing  carvedilol (COREG) 12.5 MG tablet Take 12.5 mg by mouth 2 (two) times daily  diazepam (VALIUM) 5 MG tablet Take 5 mg by mouth 2 (two) times daily.   DILTIAZEM HCL CD 360 MG 24 hr capsule Take 360 mg by mouth daily.   furosemide (LASIX) 40 MG tablet Take 40 mg by mouth daily as needed for fluid.   gabapentin (NEURONTIN) 300 MG capsule Take 300 mg by mouth 2 (two) times daily.  glimepiride (AMARYL) 4 MG tablet Take 4 mg by mouth daily before breakfast.  HYDROmorphone (DILAUDID) 4 MG tablet TAKE 1 TABLET BY MOUTH twice daily as needed for pain  Insulin Glargine (LANTUS SOLOSTAR) 100 UNIT/ML Solostar Pen Inject 20 Units daily at bedtime  LEVEMIR FLEXTOUCH 100 UNIT/ML Pen Inject 20 Units into the skin at bedtime.  lisinopril-hydrochlorothiazide (PRINZIDE,ZESTORETIC) 20-12.5 MG per tablet Take 1 tablet by mouth daily.  metFORMIN (GLUCOPHAGE) 1000 MG tablet Take 1,000 mg by mouth 2 (two) times daily with a meal.  oxybutynin (DITROPAN) 5 MG tablet Take 5 mg by mouth 2 (two) times daily.  potassium chloride SA (K-DUR,KLOR-CON) 20 MEQ tablet Take 20 mEq by mouth 2 (two) times daily as needed (take with lasix).   simvastatin (ZOCOR) 40 MG tablet Take 40 mg by mouth at bedtime.    Physical Exam: Blood pressure 150/78, pulse 49, temperature 97.5 F (36.4 C), temperature source Oral, resp. rate 12, height 5' 11.5" (1.816 m), weight 147.731 kg (325 lb 11 oz), SpO2 97 %. Filed Vitals:   05/31/15 1445 05/31/15 1455  BP: 150/78   Pulse: 51 49  Temp: 97.5 F (36.4 C)   Resp: 15 12   Physical Exam  Constitutional: Appears somnolent but easy to awake, able to follow some commands HENT: Normocephalic. External right and left ear  normal. Oropharynx is clear and moist.  Eyes: Conjunctivae and EOM are normal. PERRLA, no scleral icterus.  Neck: Normal ROM. Neck supple. No JVD. No tracheal deviation. No thyromegaly.  CVS: RRR, no gallops, no carotid bruit.  Pulmonary: Effort and breath sounds normal, on BiPAP, rhonchi at bases  Abdominal: Soft. BS +,  no distension, tenderness, rebound or guarding.  Musculoskeletal: Normal range of motion. +1 bilateral LE edema Lymphadenopathy: No lymphadenopathy noted, cervical, inguinal. Neuro: Somnolent but easy to awake, able to follow simple commands, on BiPAP Skin: Skin is warm and dry. No rash noted. Not diaphoretic. No  erythema. No pallor.  Psychiatric: Difficult to assess post op   Labs on Admission:  Basic Metabolic Panel:  Recent Labs Lab 05/24/15 1655  NA 142  K 3.6  CL 104  CO2 28  GLUCOSE 171*  BUN 23*  CREATININE 1.09  CALCIUM 9.4   Liver Function Tests:  Recent Labs Lab 05/24/15 1655  AST 22  ALT 26  ALKPHOS 66  BILITOT 0.3  PROT 7.4  ALBUMIN 3.7   CBC:  Recent Labs Lab 05/24/15 1655  WBC 11.1*  HGB 12.5*  HCT 38.8*  MCV 91.9  PLT 325   CBG:  Recent Labs Lab 05/31/15 0714 05/31/15 1132  GLUCAP 128* 187*    Radiological Exams on Admission: Dg Chest Port 1 View 05/31/2015  Bilateral patchy airspace disease and hazy interstitial prominence. Findings highly suspicious for bilateral pneumonia or bilateral pulmonary edema. No pneumothorax.  EKG: not done   Time spent: 60 minutes   MAGICK-MYERS, ISKRA [facility] Pager [phone]  If 7PM-7AM, please contact night-coverage [email or web address] Password [email or web address] 05/31/2015, 3:49 PM

## 2015-05-31 NOTE — Interval H&P Note (Signed)
History and Physical Interval Note:  05/31/2015 8:34 AM  Mario Mills  has presented today for surgery, with the diagnosis of OA LEFT KNEE   The various methods of treatment have been discussed with the patient and family. After consideration of risks, benefits and other options for treatment, the patient has consented to  Procedure(s): TOTAL LEFT  KNEE ARTHROPLASTY (Left) as a surgical intervention .  The patient's history has been reviewed, patient examined, no change in status, stable for surgery.  I have reviewed the patient's chart and labs.  Questions were answered to the patient's satisfaction.     Gearlean Alf

## 2015-05-31 NOTE — Anesthesia Preprocedure Evaluation (Addendum)
Anesthesia Evaluation  Patient identified by MRN, date of birth, ID band Patient awake    Reviewed: Allergy & Precautions, NPO status , Patient's Chart, lab work & pertinent test results, reviewed documented beta blocker date and time   Airway Mallampati: IV  TM Distance: >3 FB Neck ROM: Full    Dental  (+) Dental Advisory Given   Pulmonary shortness of breath, sleep apnea , former smoker,    breath sounds clear to auscultation       Cardiovascular hypertension, Pt. on medications and Pt. on home beta blockers + Peripheral Vascular Disease and + DOE  + Valvular Problems/Murmurs (Mild AS EF 50-55%) AS  Rhythm:Regular Rate:Normal  Intermediate risk myoview with small area of ischemia in 04/2015.   Neuro/Psych negative neurological ROS     GI/Hepatic negative GI ROS, Neg liver ROS,   Endo/Other  diabetes, Type 2Morbid obesity  Renal/GU negative Renal ROS     Musculoskeletal  (+) Arthritis ,   Abdominal   Peds  Hematology negative hematology ROS (+)   Anesthesia Other Findings   Reproductive/Obstetrics                            Lab Results  Component Value Date   WBC 11.1* 05/24/2015   HGB 12.5* 05/24/2015   HCT 38.8* 05/24/2015   MCV 91.9 05/24/2015   PLT 325 05/24/2015   Lab Results  Component Value Date   CREATININE 1.09 05/24/2015   BUN 23* 05/24/2015   NA 142 05/24/2015   K 3.6 05/24/2015   CL 104 05/24/2015   CO2 28 05/24/2015   Lab Results  Component Value Date   INR 0.96 05/24/2015    Anesthesia Physical Anesthesia Plan  ASA: III  Anesthesia Plan: MAC and Spinal   Post-op Pain Management:    Induction: Intravenous  Airway Management Planned: Natural Airway and Simple Face Mask  Additional Equipment:   Intra-op Plan:   Post-operative Plan:   Informed Consent: I have reviewed the patients History and Physical, chart, labs and discussed the procedure  including the risks, benefits and alternatives for the proposed anesthesia with the patient or authorized representative who has indicated his/her understanding and acceptance.     Plan Discussed with: CRNA  Anesthesia Plan Comments:        Anesthesia Quick Evaluation

## 2015-05-31 NOTE — Anesthesia Procedure Notes (Signed)
Procedure Name: Intubation Date/Time: 05/31/2015 9:45 AM Performed by: Lajuana Carry E Pre-anesthesia Checklist: Patient identified, Emergency Drugs available, Suction available and Patient being monitored Patient Re-evaluated:Patient Re-evaluated prior to inductionOxygen Delivery Method: Circle System Utilized Preoxygenation: Pre-oxygenation with 100% oxygen Intubation Type: IV induction Ventilation: Mask ventilation without difficulty Laryngoscope Size: Glidescope and 4 Grade View: Grade I Tube type: Oral Tube size: 7.5 mm Number of attempts: 1 Airway Equipment and Method: Video-laryngoscopy Placement Confirmation: ETT inserted through vocal cords under direct vision,  positive ETCO2 and breath sounds checked- equal and bilateral Secured at: 21 cm Tube secured with: Tape Dental Injury: Teeth and Oropharynx as per pre-operative assessment  Difficulty Due To: Difficulty was anticipated Comments: Elective glidescope intubation, grade 1 view, atraumatic placement ett, teeth/lips as pre op

## 2015-05-31 NOTE — Progress Notes (Addendum)
Pt was found off bipap, on 4lnc.  HR70, spo2 98%.  No increased wob or respiratory distress noted or voiced by patient.  Bipap in room on standby but not indicated at this time.  Pt states he wears cpap qhs at home.  RN notified and will request rx for cpap.  RT will continue to monitor and assess pt.

## 2015-05-31 NOTE — Op Note (Signed)
Pre-operative diagnosis- Osteoarthritis  Left knee(s)  Post-operative diagnosis- Osteoarthritis Left knee(s)  Procedure-  Left  Total Knee Arthroplasty  Surgeon- Mario Plover. Lynita Groseclose, MD  Assistant- Arlee Muslim, PA-C   Anesthesia- General  EBL-* No blood loss amount entered *   Drains Hemovac  Tourniquet time-  Total Tourniquet Time Documented: Thigh (Left) - 38 minutes Total: Thigh (Left) - 38 minutes     Complications- None  Condition-PACU - hemodynamically stable.   Brief Clinical Note   Mario Mills is a 65 y.o. year old male with end stage OA of his left knee with progressively worsening pain and dysfunction. He has constant pain, with activity and at rest and significant functional deficits with difficulties even with ADLs. He has had extensive non-op management including analgesics, injections of cortisone and viscosupplements, and home exercise program, but remains in significant pain with significant dysfunction. Radiographs show bone on bone arthritis medial and patellofemoral. He presents now for left Total Knee Arthroplasty.     Procedure in detail---   The patient is brought into the operating room and positioned supine on the operating table. After successful administration of  General,   a tourniquet is placed high on the  Left thigh(s) and the lower extremity is prepped and draped in the usual sterile fashion. Time out is performed by the operating team and then the  Left lower extremity is wrapped in [place], knee flexed and the tourniquet inflated to 300 mmHg.       A midline incision is made with a ten blade through the subcutaneous tissue to the level of the extensor mechanism. A fresh blade is used to make a medial parapatellar arthrotomy. Soft tissue over the proximal medial tibia is subperiosteally elevated to the joint line with a knife and into the semimembranosus bursa with a Cobb elevator. Soft tissue over the proximal lateral tibia is elevated with  attention being paid to avoiding the patellar tendon on the tibial tubercle. The patella is everted, knee flexed 90 degrees and the ACL and PCL are removed. Findings are bone on bone medial and patellofemoral with large global osteophytes.        The drill is used to create a starting hole in the distal femur and the canal is thoroughly irrigated with sterile saline to remove the fatty contents. The 5 degree Left  valgus alignment guide is placed into the femoral canal and the distal femoral cutting block is pinned to remove 10 mm off the distal femur. Resection is made with an oscillating saw.      The tibia is subluxed forward and the menisci are removed. The extramedullary alignment guide is placed referencing proximally at the medial aspect of the tibial tubercle and distally along the second metatarsal axis and tibial crest. The block is pinned to remove 19mm off the more deficient medial  side. Resection is made with an oscillating saw. Size 5is the most appropriate size for the tibia and the proximal tibia is prepared with the modular drill and keel punch for that size.      The femoral sizing guide is placed and size 5 is most appropriate. Rotation is marked off the epicondylar axis and confirmed by creating a rectangular flexion gap at 90 degrees. The size 5 cutting block is pinned in this rotation and the anterior, posterior and chamfer cuts are made with the oscillating saw. The intercondylar block is then placed and that cut is made.      Trial size 5 tibial  component, trial size 5 posterior stabilized femur and a 15  mm posterior stabilized rotating platform insert trial is placed. Full extension is achieved with excellent varus/valgus and anterior/posterior balance throughout full range of motion. The patella is everted and thickness measured to be 27  mm. Free hand resection is taken to 15 mm, a 41 template is placed, lug holes are drilled, trial patella is placed, and it tracks normally.  Osteophytes are removed off the posterior femur with the trial in place. All trials are removed and the cut bone surfaces prepared with pulsatile lavage. Cement is mixed and once ready for implantation, the size 5 tibial implant, size  5 posterior stabilized femoral component, and the size 41 patella are cemented in place and the patella is held with the clamp. The trial insert is placed and the knee held in full extension. The Exparel (20 ml mixed with 30 ml saline) and .25% Bupivicaine, are injected into the extensor mechanism, posterior capsule, medial and lateral gutters and subcutaneous tissues.  All extruded cement is removed and once the cement is hard the permanent 15 mm posterior stabilized rotating platform insert is placed into the tibial tray.      The wound is copiously irrigated with saline solution and the extensor mechanism closed over a hemovac drain with #1 V-loc suture. The tourniquet is released for a total tourniquet time of 38  minutes. Flexion against gravity is 130 degrees and the patella tracks normally. Subcutaneous tissue is closed with 2.0 vicryl and subcuticular with running 4.0 Monocryl. The incision is cleaned and dried and steri-strips and a bulky sterile dressing are applied. The limb is placed into a knee immobilizer and the patient is awakened and transported to recovery in stable condition.      Please note that a surgical assistant was a medical necessity for this procedure in order to perform it in a safe and expeditious manner. Surgical assistant was necessary to retract the ligaments and vital neurovascular structures to prevent injury to them and also necessary for proper positioning of the limb to allow for anatomic placement of the prosthesis.   Mario Plover Anum Palecek, MD    05/31/2015, 10:55 AM

## 2015-06-01 LAB — CBC
HEMATOCRIT: 35.4 % — AB (ref 39.0–52.0)
Hemoglobin: 11.1 g/dL — ABNORMAL LOW (ref 13.0–17.0)
MCH: 29.3 pg (ref 26.0–34.0)
MCHC: 31.4 g/dL (ref 30.0–36.0)
MCV: 93.4 fL (ref 78.0–100.0)
PLATELETS: 327 10*3/uL (ref 150–400)
RBC: 3.79 MIL/uL — ABNORMAL LOW (ref 4.22–5.81)
RDW: 14.7 % (ref 11.5–15.5)
WBC: 13.9 10*3/uL — AB (ref 4.0–10.5)

## 2015-06-01 LAB — BASIC METABOLIC PANEL
ANION GAP: 11 (ref 5–15)
BUN: 19 mg/dL (ref 6–20)
CALCIUM: 8.3 mg/dL — AB (ref 8.9–10.3)
CO2: 28 mmol/L (ref 22–32)
Chloride: 101 mmol/L (ref 101–111)
Creatinine, Ser: 1.07 mg/dL (ref 0.61–1.24)
GFR calc Af Amer: >60 mL/min (ref >60)
GLUCOSE: 170 mg/dL — AB (ref 65–99)
Potassium: 3.6 mmol/L (ref 3.5–5.1)
Sodium: 140 mmol/L (ref 135–145)

## 2015-06-01 MED ORDER — LEVOFLOXACIN 750 MG PO TABS
750.0000 mg | ORAL_TABLET | Freq: Every day | ORAL | Status: DC
Start: 2015-06-02 — End: 2015-06-06
  Administered 2015-06-02 – 2015-06-06 (×5): 750 mg via ORAL
  Filled 2015-06-01 (×5): qty 1

## 2015-06-01 MED ORDER — LEVOFLOXACIN 500 MG PO TABS
500.0000 mg | ORAL_TABLET | Freq: Every day | ORAL | Status: DC
  Administered 2015-06-01: 500 mg via ORAL
  Filled 2015-06-01: qty 1

## 2015-06-01 MED ORDER — HYDRALAZINE HCL 25 MG PO TABS
25.0000 mg | ORAL_TABLET | Freq: Three times a day (TID) | ORAL | Status: DC
Start: 2015-06-01 — End: 2015-06-02
  Administered 2015-06-01 – 2015-06-02 (×2): 25 mg via ORAL
  Filled 2015-06-01 (×2): qty 1

## 2015-06-01 MED ORDER — HYDROMORPHONE HCL 1 MG/ML IJ SOLN
1.0000 mg | INTRAMUSCULAR | Status: DC | PRN
  Administered 2015-06-01 – 2015-06-03 (×2): 1 mg via INTRAVENOUS
  Filled 2015-06-01 (×2): qty 1

## 2015-06-01 NOTE — Discharge Instructions (Addendum)
Dr. Gaynelle Arabian Total Joint Specialist [facility] [address], [place], [place] [address] [phone]  TOTAL KNEE REPLACEMENT POSTOPERATIVE DIRECTIONS  Knee Rehabilitation, Guidelines Following Surgery  Results after knee surgery are often greatly improved when you follow the exercise, range of motion and muscle strengthening exercises prescribed by your doctor. Safety measures are also important to protect the knee from further injury. Any time any of these exercises cause you to have increased pain or swelling in your knee joint, decrease the amount until you are comfortable again and slowly increase them. If you have problems or questions, call your caregiver or physical therapist for advice.   HOME CARE INSTRUCTIONS  Remove items at home which could result in a fall. This includes throw rugs or furniture in walking pathways.   ICE to the affected knee every three hours for 30 minutes at a time and then as needed for pain and swelling.  Continue to use ice on the knee for pain and swelling from surgery. You may notice swelling that will progress down to the foot and ankle.  This is normal after surgery.  Elevate the leg when you are not up walking on it.    Continue to use the breathing machine which will help keep your temperature down.  It is common for your temperature to cycle up and down following surgery, especially at night when you are not up moving around and exerting yourself.  The breathing machine keeps your lungs expanded and your temperature down.  Do not place pillow under knee, focus on keeping the knee straight while resting  DIET You may resume your previous home diet once your are discharged from the hospital.  DRESSING / WOUND CARE / SHOWERING You may start showering once you are discharged home but do not submerge the incision under water. Just pat the incision dry and apply a dry gauze dressing on daily. Change the surgical dressing  daily and reapply a dry dressing each time.  ACTIVITY Walk with your walker as instructed. Use walker as long as suggested by your caregivers. Avoid periods of inactivity such as sitting longer than an hour when not asleep. This helps prevent blood clots.  You may resume a sexual relationship in one month or when given the OK by your doctor.  You may return to work once you are cleared by your doctor.  Do not drive a car for 6 weeks or until released by you surgeon.  Do not drive while taking narcotics.  WEIGHT BEARING AS TOLERATED  POSTOPERATIVE CONSTIPATION PROTOCOL Constipation - defined medically as fewer than three stools per week and severe constipation as less than one stool per week.  One of the most common issues patients have following surgery is constipation.  Even if you have a regular bowel pattern at home, your normal regimen is likely to be disrupted due to multiple reasons following surgery.  Combination of anesthesia, postoperative narcotics, change in appetite and fluid intake all can affect your bowels.  In order to avoid complications following surgery, here are some recommendations in order to help you during your recovery period.  Colace (docusate) - Pick up an over-the-counter form of Colace or another stool softener and take twice a day as long as you are requiring postoperative pain medications.  Take with a full glass of water daily.  If you experience loose stools or diarrhea, hold the colace until you stool forms back up.  If your symptoms do not get better  within 1 week or if they get worse, check with your doctor.  Dulcolax (bisacodyl) - Pick up over-the-counter and take as directed by the product packaging as needed to assist with the movement of your bowels.  Take with a full glass of water.  Use this product as needed if not relieved by Colace only.   MiraLax (polyethylene glycol) - Pick up over-the-counter to have on hand.  MiraLax is a solution that will  increase the amount of water in your bowels to assist with bowel movements.  Take as directed and can mix with a glass of water, juice, soda, coffee, or tea.  Take if you go more than two days without a movement. Do not use MiraLax more than once per day. Call your doctor if you are still constipated or irregular after using this medication for 7 days in a row.  If you continue to have problems with postoperative constipation, please contact the office for further assistance and recommendations.  If you experience "the worst abdominal pain ever" or develop nausea or vomiting, please contact the office immediatly for further recommendations for treatment.  ITCHING  If you experience itching with your medications, try taking only a single pain pill, or even half a pain pill at a time.  You can also use Benadryl over the counter for itching or also to help with sleep.   TED HOSE STOCKINGS Wear the elastic stockings on both legs for three weeks following surgery during the day but you may remove then at night for sleeping.  MEDICATIONS See your medication summary on the After Visit Summary that the nursing staff will review with you prior to discharge.  You may have some home medications which will be placed on hold until you complete the course of blood thinner medication.  It is important for you to complete the blood thinner medication as prescribed by your surgeon.  Continue your approved medications as instructed at time of discharge.  PRECAUTIONS If you experience chest pain or shortness of breath - call 911 immediately for transfer to the hospital emergency department.  If you develop a fever greater that 101 F, purulent drainage from wound, increased redness or drainage from wound, foul odor from the wound/dressing, or calf pain - CONTACT YOUR SURGEON.                                                   FOLLOW-UP APPOINTMENTS Make sure you keep all of your appointments after your operation  with your surgeon and caregivers. You should call the office at the above phone number and make an appointment for approximately two weeks after the date of your surgery or on the date instructed by your surgeon outlined in the "After Visit Summary".   RANGE OF MOTION AND STRENGTHENING EXERCISES  Rehabilitation of the knee is important following a knee injury or an operation. After just a few days of immobilization, the muscles of the thigh which control the knee become weakened and shrink (atrophy). Knee exercises are designed to build up the tone and strength of the thigh muscles and to improve knee motion. Often times heat used for twenty to thirty minutes before working out will loosen up your tissues and help with improving the range of motion but do not use heat for the first two weeks following surgery. These exercises can  be done on a training (exercise) mat, on the floor, on a table or on a bed. Use what ever works the best and is most comfortable for you Knee exercises include:  Leg Lifts - While your knee is still immobilized in a splint or cast, you can do straight leg raises. Lift the leg to 60 degrees, hold for 3 sec, and slowly lower the leg. Repeat 10-20 times 2-3 times daily. Perform this exercise against resistance later as your knee gets better.  Quad and Hamstring Sets - Tighten up the muscle on the front of the thigh (Quad) and hold for 5-10 sec. Repeat this 10-20 times hourly. Hamstring sets are done by pushing the foot backward against an object and holding for 5-10 sec. Repeat as with quad sets.   Leg Slides: Lying on your back, slowly slide your foot toward your buttocks, bending your knee up off the floor (only go as far as is comfortable). Then slowly slide your foot back down until your leg is flat on the floor again.  Angel Wings: Lying on your back spread your legs to the side as far apart as you can without causing discomfort.  A rehabilitation program following serious knee  injuries can speed recovery and prevent re-injury in the future due to weakened muscles. Contact your doctor or a physical therapist for more information on knee rehabilitation.   IF YOU ARE TRANSFERRED TO A SKILLED REHAB FACILITY If the patient is transferred to a skilled rehab facility following release from the hospital, a list of the current medications will be sent to the facility for the patient to continue.  When discharged from the skilled rehab facility, please have the facility set up the patient's [place] prior to being released. Also, the skilled facility will be responsible for providing the patient with their medications at time of release from the facility to include their pain medication, the muscle relaxants, and their blood thinner medication. If the patient is still at the rehab facility at time of the two week follow up appointment, the skilled rehab facility will also need to assist the patient in arranging follow up appointment in our office and any transportation needs.  MAKE SURE YOU:  Understand these instructions.  Get help right away if you are not doing well or get worse.    Pick up stool softner and laxative for home use following surgery while on pain medications. Do not submerge incision under water. Please use good hand washing techniques while changing dressing each day. May shower starting three days after surgery. Please use a clean towel to pat the incision dry following showers. Continue to use ice for pain and swelling after surgery. Do not use any lotions or creams on the incision until instructed by your surgeon.   Information on my medicine - ELIQUIS (apixaban)  This medication education was reviewed with me or my healthcare representative as part of my discharge preparation.  The pharmacist that spoke with me during my hospital stay was:  Hershal Coria, [facility]  Why was Eliquis prescribed for you? Eliquis was prescribed for you to  reduce the risk of blood clots forming after orthopedic surgery.    What do You need to know about Eliquis? Take your Eliquis TWICE DAILY - one tablet in the morning and one tablet in the evening with or without food.  It would be best to take the dose about the same time each day.  If you have difficulty swallowing the  tablet whole please discuss with your pharmacist how to take the medication safely.  Take Eliquis exactly as prescribed by your doctor and DO NOT stop taking Eliquis without talking to the doctor who prescribed the medication.  Stopping without other medication to take the place of Eliquis may increase your risk of developing a clot.  After discharge, you should have regular check-up appointments with your healthcare provider that is prescribing your Eliquis.  What do you do if you miss a dose? If a dose of ELIQUIS is not taken at the scheduled time, take it as soon as possible on the same day and twice-daily administration should be resumed.  The dose should not be doubled to make up for a missed dose.  Do not take more than one tablet of ELIQUIS at the same time.  Important Safety Information A possible side effect of Eliquis is bleeding. You should call your healthcare provider right away if you experience any of the following: ? Bleeding from an injury or your nose that does not stop. ? Unusual colored urine (red or dark brown) or unusual colored stools (red or black). ? Unusual bruising for unknown reasons. ? A serious fall or if you hit your head (even if there is no bleeding).  Some medicines may interact with Eliquis and might increase your risk of bleeding or clotting while on Eliquis. To help avoid this, consult your healthcare provider or pharmacist prior to using any new prescription or non-prescription medications, including herbals, vitamins, non-steroidal anti-inflammatory drugs (NSAIDs) and supplements.  This website has more information on Eliquis  (apixaban): http://www.eliquis.com/eliquis/home     Dr. Gaynelle Arabian Total Joint Specialist [facility] [address], [place], [place] [address] [phone]  TOTAL KNEE REPLACEMENT POSTOPERATIVE DIRECTIONS  Knee Rehabilitation, Guidelines Following Surgery  Results after knee surgery are often greatly improved when you follow the exercise, range of motion and muscle strengthening exercises prescribed by your doctor. Safety measures are also important to protect the knee from further injury. Any time any of these exercises cause you to have increased pain or swelling in your knee joint, decrease the amount until you are comfortable again and slowly increase them. If you have problems or questions, call your caregiver or physical therapist for advice.   HOME CARE INSTRUCTIONS  Remove items at home which could result in a fall. This includes throw rugs or furniture in walking pathways.   ICE to the affected knee every three hours for 30 minutes at a time and then as needed for pain and swelling.  Continue to use ice on the knee for pain and swelling from surgery. You may notice swelling that will progress down to the foot and ankle.  This is normal after surgery.  Elevate the leg when you are not up walking on it.    Continue to use the breathing machine which will help keep your temperature down.  It is common for your temperature to cycle up and down following surgery, especially at night when you are not up moving around and exerting yourself.  The breathing machine keeps your lungs expanded and your temperature down.  Do not place pillow under knee, focus on keeping the knee straight while resting  DIET You may resume your previous home diet once your are discharged from the hospital.  DRESSING / WOUND CARE / SHOWERING You may shower 3 days after surgery, but keep the wounds dry during showering.  You may use an occlusive plastic wrap (Press'n Seal  for  example), NO SOAKING/SUBMERGING IN THE BATHTUB.  If the bandage gets wet, change with a clean dry gauze.  If the incision gets wet, pat the wound dry with a clean towel. You may start showering once you are discharged home but do not submerge the incision under water. Just pat the incision dry and apply a dry gauze dressing on daily. Change the surgical dressing daily and reapply a dry dressing each time.  ACTIVITY Walk with your walker as instructed. Use walker as long as suggested by your caregivers. Avoid periods of inactivity such as sitting longer than an hour when not asleep. This helps prevent blood clots.  You may resume a sexual relationship in one month or when given the OK by your doctor.  You may return to work once you are cleared by your doctor.  Do not drive a car for 6 weeks or until released by you surgeon.  Do not drive while taking narcotics.  WEIGHT BEARING Weight bearing as tolerated with assist device (walker, cane, etc) as directed, use it as long as suggested by your surgeon or therapist, typically at least 4-6 weeks.  POSTOPERATIVE CONSTIPATION PROTOCOL Constipation - defined medically as fewer than three stools per week and severe constipation as less than one stool per week.  One of the most common issues patients have following surgery is constipation.  Even if you have a regular bowel pattern at home, your normal regimen is likely to be disrupted due to multiple reasons following surgery.  Combination of anesthesia, postoperative narcotics, change in appetite and fluid intake all can affect your bowels.  In order to avoid complications following surgery, here are some recommendations in order to help you during your recovery period.  Colace (docusate) - Pick up an over-the-counter form of Colace or another stool softener and take twice a day as long as you are requiring postoperative pain medications.  Take with a full glass of water daily.  If you experience loose  stools or diarrhea, hold the colace until you stool forms back up.  If your symptoms do not get better within 1 week or if they get worse, check with your doctor.  Dulcolax (bisacodyl) - Pick up over-the-counter and take as directed by the product packaging as needed to assist with the movement of your bowels.  Take with a full glass of water.  Use this product as needed if not relieved by Colace only.   MiraLax (polyethylene glycol) - Pick up over-the-counter to have on hand.  MiraLax is a solution that will increase the amount of water in your bowels to assist with bowel movements.  Take as directed and can mix with a glass of water, juice, soda, coffee, or tea.  Take if you go more than two days without a movement. Do not use MiraLax more than once per day. Call your doctor if you are still constipated or irregular after using this medication for 7 days in a row.  If you continue to have problems with postoperative constipation, please contact the office for further assistance and recommendations.  If you experience "the worst abdominal pain ever" or develop nausea or vomiting, please contact the office immediatly for further recommendations for treatment.  ITCHING  If you experience itching with your medications, try taking only a single pain pill, or even half a pain pill at a time.  You can also use Benadryl over the counter for itching or also to help with sleep.   TED HOSE STOCKINGS  Wear the elastic stockings on both legs for three weeks following surgery during the day but you may remove then at night for sleeping.  MEDICATIONS See your medication summary on the After Visit Summary that the nursing staff will review with you prior to discharge.  You may have some home medications which will be placed on hold until you complete the course of blood thinner medication.  It is important for you to complete the blood thinner medication as prescribed by your surgeon.  Continue your approved  medications as instructed at time of discharge.  PRECAUTIONS If you experience chest pain or shortness of breath - call 911 immediately for transfer to the hospital emergency department.  If you develop a fever greater that 101 F, purulent drainage from wound, increased redness or drainage from wound, foul odor from the wound/dressing, or calf pain - CONTACT YOUR SURGEON.                                                   FOLLOW-UP APPOINTMENTS Make sure you keep all of your appointments after your operation with your surgeon and caregivers. You should call the office at the above phone number and make an appointment for approximately two weeks after the date of your surgery or on the date instructed by your surgeon outlined in the "After Visit Summary".   RANGE OF MOTION AND STRENGTHENING EXERCISES  Rehabilitation of the knee is important following a knee injury or an operation. After just a few days of immobilization, the muscles of the thigh which control the knee become weakened and shrink (atrophy). Knee exercises are designed to build up the tone and strength of the thigh muscles and to improve knee motion. Often times heat used for twenty to thirty minutes before working out will loosen up your tissues and help with improving the range of motion but do not use heat for the first two weeks following surgery. These exercises can be done on a training (exercise) mat, on the floor, on a table or on a bed. Use what ever works the best and is most comfortable for you Knee exercises include:  Leg Lifts - While your knee is still immobilized in a splint or cast, you can do straight leg raises. Lift the leg to 60 degrees, hold for 3 sec, and slowly lower the leg. Repeat 10-20 times 2-3 times daily. Perform this exercise against resistance later as your knee gets better.  Quad and Hamstring Sets - Tighten up the muscle on the front of the thigh (Quad) and hold for 5-10 sec. Repeat this 10-20 times hourly.  Hamstring sets are done by pushing the foot backward against an object and holding for 5-10 sec. Repeat as with quad sets.   Leg Slides: Lying on your back, slowly slide your foot toward your buttocks, bending your knee up off the floor (only go as far as is comfortable). Then slowly slide your foot back down until your leg is flat on the floor again.  Angel Wings: Lying on your back spread your legs to the side as far apart as you can without causing discomfort.  A rehabilitation program following serious knee injuries can speed recovery and prevent re-injury in the future due to weakened muscles. Contact your doctor or a physical therapist for more information on knee rehabilitation.   IF YOU  ARE TRANSFERRED TO A SKILLED REHAB FACILITY If the patient is transferred to a skilled rehab facility following release from the hospital, a list of the current medications will be sent to the facility for the patient to continue.  When discharged from the skilled rehab facility, please have the facility set up the patient's [name] prior to being released. Also, the skilled facility will be responsible for providing the patient with their medications at time of release from the facility to include their pain medication, the muscle relaxants, and their blood thinner medication. If the patient is still at the rehab facility at time of the two week follow up appointment, the skilled rehab facility will also need to assist the patient in arranging follow up appointment in our office and any transportation needs.  MAKE SURE YOU:  Understand these instructions.  Get help right away if you are not doing well or get worse.    Pick up stool softner and laxative for home use following surgery while on pain medications. Do not submerge incision under water. Please use good hand washing techniques while changing dressing each day. May shower starting three days after surgery. Please use a clean  towel to pat the incision dry following showers. Continue to use ice for pain and swelling after surgery. Do not use any lotions or creams on the incision until instructed by your surgeon.  Take Eliquis for two more weeks and then switch to a baby 81 mg Aspirin daily for three additional weeks.

## 2015-06-01 NOTE — Progress Notes (Addendum)
Subjective: 1 Day Post-Op Procedure(s) (LRB): TOTAL LEFT  KNEE ARTHROPLASTY (Left)  Patient reports pain as mild to moderate.  Tolerating POs well.  Denies BM, but admits to flatulence.  Denies fever, chills, N/V.  Patient is in good spirits this am.    Objective:   VITALS:  Temp:  [97.2 F (36.2 C)-99.5 F (37.5 C)] 99.5 F (37.5 C) (01/14 0400) Pulse Rate:  [49-80] 75 (01/14 0630) Resp:  [12-37] 24 (01/14 0630) BP: (145-230)/(63-116) 161/67 mmHg (01/14 0630) SpO2:  [84 %-100 %] 99 % (01/14 0630) FiO2 (%):  [50 %-100 %] 55 % (01/13 1700) Weight:  [146.2 kg (322 lb 5 oz)] 146.2 kg (322 lb 5 oz) (01/13 1600)  General: WDWN patient in NAD. Psych:  Appropriate mood and affect. Neuro:  A&O x 3, Moving all extremities, sensation intact to light touch HEENT:  EOMs intact= Chest:  Even non-labored respirations on bipap as I entered the room.  Removed CPAP for evaluation and respirations remained non-labored. Skin:  Dressing C/D/I.  Drain pulled, sterile gauze applied. Extremities: warm/dry, mild edema, no erythmea, or echymosis.  No lymphadenopathy. Pulses: Popliteus 2+ MSK:  ROM: Knee 5-75 degrees, MMT: patient able to perform quad set, (-) Homan's    LABS  Recent Labs  05/31/15 1710 06/01/15 0339  HGB 12.6* 11.1*  WBC 16.4* 13.9*  PLT 350 327    Recent Labs  05/31/15 1710 06/01/15 0339  NA 140 140  K 3.7 3.6  CL 101 101  CO2 29 28  BUN 16 19  CREATININE 1.04 1.07  GLUCOSE 171* 170*    Recent Labs  05/31/15 1710  INR 1.05     Assessment/Plan: 1 Day Post-Op Procedure(s) (LRB): TOTAL LEFT  KNEE ARTHROPLASTY (Left)  Up with therapy  Medicine team to orchestrate likely step down today.  Appreciate Medicine team's assistance with patient. Drain pulled, sterile gauze applied. Plan for D/C to SNF on Monday. Eliquis for DVT prophylaxis   Mechele Claude, PA-C, [place] Office:  [phone]

## 2015-06-01 NOTE — Progress Notes (Signed)
Spoke to patient about expected discharge needs.  Stated that he has no one at home to assist him and wants to go to SNF--[facility]. SW referral made. Will continue to follow.    Rayburn Ma RN, BSN, CM

## 2015-06-01 NOTE — Progress Notes (Signed)
PHARMACY NOTE -  ANTIBIOTIC RENAL DOSE ADJUSTMENT   Request received for Pharmacy to assist with antibiotic renal dose adjustment.  Patient has been initiated on Levaquin 500 mg daily for bilateral pneumonia. For the indication of bilateral pneumonia with good renal function - SCr 1.07, estimated CrCl >100 ml/min, would increase Levaquin dose to 750 mg PO daily. With stable SCr, further dosage adjustment appears unlikely at present so pharmacy will sign off at this time.  Please reconsult if a change in clinical status warrants re-evaluation of dosage.  Hershal Coria, PharmD, BCPS Pager: [phone] 06/01/2015 1:29 PM

## 2015-06-01 NOTE — Progress Notes (Signed)
Verbal referral to [facility] regarding patient 's wishes about SNF discharge to [facility].     Rayburn Ma RN, BSN, CM

## 2015-06-01 NOTE — Evaluation (Signed)
Physical Therapy Evaluation Patient Details Name: Mario Mills MRN: [id number] DOB: Jan 08, 1951 Today's Date: 06/01/2015   History of Present Illness  Pt is a 65 year old male s/p L TKA with post op acute respiratory failure with hypoxia initially requiring BiPAP and hypertensive urgency (on nitro drip).  PMHx HTN, DM, PAD, OSA, obesity  Clinical Impression  Pt admitted with above diagnosis. Pt currently with functional limitations due to the deficits listed below (see PT Problem List).  Pt will benefit from skilled PT to increase their independence and safety with mobility to allow discharge to the venue listed below.  Pt tolerated short distance ambulation fairly well POD #1.  Pt plans to d/c to SNF for rehab.     Follow Up Recommendations SNF    Equipment Recommendations  Rolling walker with 5" wheels (wide)    Recommendations for Other Services       Precautions / Restrictions Precautions Precautions: Fall;Knee Required Braces or Orthoses: Knee Immobilizer - Left Knee Immobilizer - Left: Discontinue once straight leg raise with < 10 degree lag Restrictions Weight Bearing Restrictions: No Other Position/Activity Restrictions: WBAT      Mobility  Bed Mobility Overal bed mobility: Needs Assistance;+2 for physical assistance Bed Mobility: Supine to Sit;Sit to Supine     Supine to sit: Mod assist;HOB elevated Sit to supine: Mod assist   General bed mobility comments: assist for L LE over EOB, assist for trunk upright, required assist for bil LEs onto bed  Transfers Overall transfer level: Needs assistance Equipment used: Rolling walker (2 wheeled) Transfers: Sit to/from Stand Sit to Stand: Mod assist;From elevated surface;+2 physical assistance         General transfer comment: verbal cues for UE and LE positioning, assist to rise and steady as well as control descent  Pt with dizziness initially upon standing however resolved  quickly.  Ambulation/Gait Ambulation/Gait assistance: Mod assist;Min assist;+2 safety/equipment Ambulation Distance (Feet): 64 Feet Assistive device: Rolling walker (2 wheeled) Gait Pattern/deviations: Step-to pattern;Antalgic;Decreased weight shift to left   Gait velocity interpretation: Below normal speed for age/gender General Gait Details: verbal cues for sequence, step length, RW positioning, posture, pt performed distance to tolerance, SpO2 91% room air during gait, BP 146/55 mmHg on monitor upon return to room  Stairs            Wheelchair Mobility    Modified Rankin (Stroke Patients Only)       Balance                                             Pertinent Vitals/Pain Pain Assessment: 0-10 Pain Score: 5  Pain Location: L knee Pain Descriptors / Indicators: Sore;Aching Pain Intervention(s): Limited activity within patient's tolerance;Monitored during session;Repositioned;Ice applied    Home Living Family/patient expects to be discharged to:: Skilled nursing facility Living Arrangements: Spouse/significant other                    Prior Function Level of Independence: Independent               Hand Dominance        Extremity/Trunk Assessment               Lower Extremity Assessment: LLE deficits/detail   LLE Deficits / Details: unable to perform SLR, observed at least 40* AAROM, lacking 15* extension, maintained KI  Communication   Communication: No difficulties  Cognition Arousal/Alertness: Awake/alert Behavior During Therapy: WFL for tasks assessed/performed Overall Cognitive Status: Within Functional Limits for tasks assessed                      General Comments      Exercises        Assessment/Plan    PT Assessment Patient needs continued PT services  PT Diagnosis Acute pain;Abnormality of gait   PT Problem List Decreased strength;Decreased range of motion;Decreased activity  tolerance;Decreased balance;Decreased mobility;Decreased knowledge of precautions;Decreased knowledge of use of DME;Obesity;Pain  PT Treatment Interventions Functional mobility training;Gait training;DME instruction;Patient/family education;Therapeutic activities;Therapeutic exercise   PT Goals (Current goals can be found in the Care Plan section) Acute Rehab PT Goals PT Goal Formulation: With patient Time For Goal Achievement: 06/05/15 Potential to Achieve Goals: Good    Frequency 7X/week   Barriers to discharge        Co-evaluation               End of Session Equipment Utilized During Treatment: Gait belt;Left knee immobilizer Activity Tolerance: Patient limited by fatigue;Patient limited by pain Patient left: in bed;with call bell/phone within reach;with family/visitor present           Time: 1413-1446 PT Time Calculation (min) (ACUTE ONLY): 33 min   Charges:   PT Evaluation $PT Eval Moderate Complexity: 1 Procedure PT Treatments $Gait Training: 8-22 mins   PT G Codes:        Cathe Bilger,KATHrine E 06/01/2015, 3:41 PM Carmelia Bake, PT, DPT 06/01/2015 Pager: [phone]

## 2015-06-01 NOTE — Progress Notes (Signed)
Patient ID: Mario Mills, male   DOB: Dec 29, 1950, 65 y.o.   MRN: [id number]  [facility] PROGRESS NOTE  Mario Mills [id number] DOB: April 22, 1951 DOA: 06/13/2015 PCP: Philis Fendt, MD   Brief narrative:    65 y.o. year old male with end stage OA of his left knee with progressively worsening pain and dysfunction, admitted for left Total Knee Arthroplasty. Post op hypoxic and placed on BiPAP, [facility] asked to assist.   Assessment/Plan:    Principal Problem:  Acute respiratory failure with hypoxia (HCC) - initially on BiPAP post op and currently saturating > 95% on RA - appears to be secondary to bilateral PNA as per CXR - WBC trending down, transition to oral Levaquin today  - transfer to tele bed once off nitro drip   Active Problems:  Hypertensive urgency - pt on Coreg, Cardizem, lasix, lisinopril - HCTZ at home  - hold Coreg due to bradycardia - also added Hydralazine as needed and scheduled dosing - currently on nitro drip   S/P left knee arthroscopy, post op day #1 - per ortho team    Sepsis, unspecified organism ([place]) - sepsis order set in place, possibly related to bilateral PNA - transition to oral Levaquin  - follow up on blood cultures    Bradycardia with 41 - 50 beats per minute - hold coreg for now until HR stabilizes    DM (diabetes mellitus) type II controlled peripheral vascular disorder ([place]) - continue home medical regimen with insulin - hold oral antihyperglycemic regimen for now   Morbid obesity due to excess calories ([place]) - Body mass index is 44.8 kg/(m^2).  DVT prophylaxis - pt on Apixaban  Code Status: Full.  Family Communication:  plan of care discussed with the patient Disposition Plan: Home vs SNF by 11/16  IV access:  Peripheral IV  Procedures and diagnostic studies:    Dg Chest Port 1 View 2015-06-13 Bilateral patchy airspace disease and hazy interstitial prominence. Findings highly suspicious for bilateral  pneumonia or bilateral pulmonary edema. No pneumothorax.  Medical Consultants:  [facility]  Other Consultants:  PT  IAnti-Infectives:   Vancomycin 2022-06-12 --> Maxipime 11/13 -->  Mario Ramsay, MD  [facility] Pager [phone]  If 7PM-7AM, please contact night-coverage [email or web address] Password [email or web address] 06/01/2015, 9:10 AM   LOS: 1 day   HPI/Subjective: No events overnight.   Objective: Filed Vitals:   06/01/15 0545 06/01/15 0600 06/01/15 0615 06/01/15 0630  BP: 171/74 166/71 162/68 161/67  Pulse: 74 73 72 75  Temp:      TempSrc:      Resp: 21 26 20 24   Height:      Weight:      SpO2: 98% 99% 98% 99%    Intake/Output Summary (Last 24 hours) at 06/01/15 0910 Last data filed at 06/01/15 0600  Gross per 24 hour  Intake 3459.69 ml  Output   2475 ml  Net 984.69 ml    Exam:   General:  Pt is alert, follows commands appropriately, not in acute distress  Cardiovascular: Regular rate and rhythm, S1/S2, no murmurs, no rubs, no gallops  Respiratory: Clear to auscultation bilaterally, no wheezing, mild rhonchi at bases   Abdomen: Soft, non tender, non distended, bowel sounds present, no guarding  Extremities: pulses DP and PT palpable bilaterally  Neuro: Grossly nonfocal  Data Reviewed: Basic Metabolic Panel:  Recent Labs Lab 13-Jun-2015 1710 06/01/15 0339  NA 140 140  K 3.7 3.6  CL 101 101  CO2 29 28  GLUCOSE  171* 170*  BUN 16 19  CREATININE 1.04 1.07  CALCIUM 8.7* 8.3*   Liver Function Tests:  Recent Labs Lab 05/31/15 1710  AST 45*  ALT 68*  ALKPHOS 72  BILITOT 0.6  PROT 7.3  ALBUMIN 3.3*   CBC:  Recent Labs Lab 05/31/15 1710 06/01/15 0339  WBC 16.4* 13.9*  NEUTROABS 15.0*  --   HGB 12.6* 11.1*  HCT 40.6 35.4*  MCV 93.3 93.4  PLT 350 327   CBG:  Recent Labs Lab 05/31/15 0714 05/31/15 1132 05/31/15 1633 05/31/15 2203  GLUCAP 128* 187* 183* 243*    Recent Results (from the past 240 hour(s))  Surgical pcr screen     Status: Abnormal    Collection Time: 05/24/15  4:34 PM  Result Value Ref Range Status   MRSA, PCR NEGATIVE NEGATIVE Final   Staphylococcus aureus POSITIVE (A) NEGATIVE Final    Comment:        The Xpert SA Assay (FDA approved for NASAL specimens in patients over 66 years of age), is one component of a comprehensive surveillance program.  Test performance has been validated by [facility] for patients greater than or equal to 66 year old. It is not intended to diagnose infection nor to guide or monitor treatment.   MRSA PCR Screening     Status: None   Collection Time: 05/31/15  4:10 PM  Result Value Ref Range Status   MRSA by PCR NEGATIVE NEGATIVE Final    Comment:        The GeneXpert MRSA Assay (FDA approved for NASAL specimens only), is one component of a comprehensive MRSA colonization surveillance program. It is not intended to diagnose MRSA infection nor to guide or monitor treatment for MRSA infections.      Scheduled Meds: . acetaminophen  1,000 mg Oral 4 times per day  . apixaban  2.5 mg Oral Q12H  . atorvastatin  20 mg Oral QHS  . ceFEPime (MAXIPIME) IV  2 g Intravenous 3 times per day  . dexamethasone  10 mg Intravenous Once  . diazepam  5 mg Oral BID  . diltiazem  360 mg Oral Daily  . docusate sodium  100 mg Oral BID  . furosemide  40 mg Oral Daily  . gabapentin  300 mg Oral BID  . hydrALAZINE  10 mg Oral 3 times per day  . insulin aspart  0-15 Units Subcutaneous TID WC  . insulin detemir  20 Units Subcutaneous QHS  . oxybutynin  5 mg Oral BID  . vancomycin  1,250 mg Intravenous Q12H   Continuous Infusions: . 0.9 % NaCl with KCl 20 mEq / L 10 mL/hr (05/31/15 1759)  . nitroGLYCERIN 80 mcg/min (06/01/15 0631)

## 2015-06-02 ENCOUNTER — Encounter (HOSPITAL_COMMUNITY): Payer: Self-pay | Admitting: Radiology

## 2015-06-02 ENCOUNTER — Inpatient Hospital Stay (HOSPITAL_COMMUNITY): Payer: Medicare Other

## 2015-06-02 DIAGNOSIS — D62 Acute posthemorrhagic anemia: Secondary | ICD-10-CM | POA: Diagnosis not present

## 2015-06-02 DIAGNOSIS — R0989 Other specified symptoms and signs involving the circulatory and respiratory systems: Secondary | ICD-10-CM | POA: Diagnosis present

## 2015-06-02 LAB — BASIC METABOLIC PANEL
ANION GAP: 8 (ref 5–15)
BUN: 20 mg/dL (ref 6–20)
CHLORIDE: 102 mmol/L (ref 101–111)
CO2: 27 mmol/L (ref 22–32)
Calcium: 8.2 mg/dL — ABNORMAL LOW (ref 8.9–10.3)
Creatinine, Ser: 1.01 mg/dL (ref 0.61–1.24)
GFR calc Af Amer: >60 mL/min (ref >60)
GFR calc non Af Amer: >60 mL/min (ref >60)
GLUCOSE: 182 mg/dL — AB (ref 65–99)
POTASSIUM: 3.7 mmol/L (ref 3.5–5.1)
Sodium: 137 mmol/L (ref 135–145)

## 2015-06-02 LAB — GLUCOSE, CAPILLARY
GLUCOSE-CAPILLARY: 244 mg/dL — AB (ref 65–99)
GLUCOSE-CAPILLARY: 228 mg/dL — AB (ref 65–99)
GLUCOSE-CAPILLARY: 178 mg/dL — AB (ref 65–99)
Glucose-Capillary: 167 mg/dL — ABNORMAL HIGH (ref 65–99)
Glucose-Capillary: 236 mg/dL — ABNORMAL HIGH (ref 65–99)
Glucose-Capillary: 285 mg/dL — ABNORMAL HIGH (ref 65–99)
Glucose-Capillary: 237 mg/dL — ABNORMAL HIGH (ref 65–99)

## 2015-06-02 LAB — CBC
HEMATOCRIT: 31.1 % — AB (ref 39.0–52.0)
HEMOGLOBIN: 9.8 g/dL — AB (ref 13.0–17.0)
MCH: 28.8 pg (ref 26.0–34.0)
MCHC: 31.5 g/dL (ref 30.0–36.0)
MCV: 91.5 fL (ref 78.0–100.0)
Platelets: 288 10*3/uL (ref 150–400)
RBC: 3.4 MIL/uL — AB (ref 4.22–5.81)
RDW: 14.9 % (ref 11.5–15.5)
WBC: 15.6 10*3/uL — AB (ref 4.0–10.5)

## 2015-06-02 MED ORDER — IOHEXOL 350 MG/ML SOLN
100.0000 mL | Freq: Once | INTRAVENOUS | Status: AC | PRN
  Administered 2015-06-02: 100 mL via INTRAVENOUS

## 2015-06-02 MED ORDER — GI COCKTAIL ~~LOC~~
30.0000 mL | Freq: Three times a day (TID) | ORAL | Status: DC | PRN
  Administered 2015-06-02: 30 mL via ORAL
  Filled 2015-06-02 (×2): qty 30

## 2015-06-02 MED ORDER — HYDROCHLOROTHIAZIDE 12.5 MG PO CAPS
12.5000 mg | ORAL_CAPSULE | Freq: Every day | ORAL | Status: DC
  Administered 2015-06-02 – 2015-06-11 (×10): 12.5 mg via ORAL
  Filled 2015-06-02 (×10): qty 1

## 2015-06-02 MED ORDER — CARVEDILOL 12.5 MG PO TABS
12.5000 mg | ORAL_TABLET | Freq: Two times a day (BID) | ORAL | Status: DC
  Administered 2015-06-02 – 2015-06-11 (×18): 12.5 mg via ORAL
  Filled 2015-06-02 (×18): qty 1

## 2015-06-02 MED ORDER — FUROSEMIDE 10 MG/ML IJ SOLN
40.0000 mg | Freq: Two times a day (BID) | INTRAMUSCULAR | Status: AC
  Administered 2015-06-02 – 2015-06-03 (×2): 40 mg via INTRAVENOUS
  Filled 2015-06-02 (×3): qty 4

## 2015-06-02 MED ORDER — LISINOPRIL-HYDROCHLOROTHIAZIDE 20-12.5 MG PO TABS: 1.0000 | ORAL_TABLET | Freq: Every day | ORAL | Status: DC

## 2015-06-02 MED ORDER — FUROSEMIDE 10 MG/ML IJ SOLN
40.0000 mg | Freq: Two times a day (BID) | INTRAMUSCULAR | Status: DC
  Administered 2015-06-02: 40 mg via INTRAVENOUS

## 2015-06-02 MED ORDER — FUROSEMIDE 10 MG/ML IJ SOLN
40.0000 mg | Freq: Every day | INTRAMUSCULAR | Status: DC
  Administered 2015-06-04 – 2015-06-07 (×4): 40 mg via INTRAVENOUS
  Filled 2015-06-02 (×4): qty 4

## 2015-06-02 MED ORDER — HYDRALAZINE HCL 25 MG PO TABS
25.0000 mg | ORAL_TABLET | Freq: Three times a day (TID) | ORAL | Status: DC
  Administered 2015-06-02 – 2015-06-03 (×3): 25 mg via ORAL
  Filled 2015-06-02 (×3): qty 1

## 2015-06-02 MED ORDER — HYDRALAZINE HCL 50 MG PO TABS: 50.0000 mg | ORAL_TABLET | Freq: Three times a day (TID) | ORAL | Status: DC

## 2015-06-02 MED ORDER — GUAIFENESIN-DM 100-10 MG/5ML PO SYRP
5.0000 mL | ORAL_SOLUTION | ORAL | Status: DC | PRN
  Administered 2015-06-02 – 2015-06-07 (×3): 5 mL via ORAL
  Filled 2015-06-02 (×3): qty 10

## 2015-06-02 MED ORDER — LISINOPRIL 20 MG PO TABS
20.0000 mg | ORAL_TABLET | Freq: Every day | ORAL | Status: DC
  Administered 2015-06-02 – 2015-06-11 (×10): 20 mg via ORAL
  Filled 2015-06-02 (×5): qty 1
  Filled 2015-06-02: qty 2
  Filled 2015-06-02: qty 1
  Filled 2015-06-02 (×2): qty 2
  Filled 2015-06-02: qty 1

## 2015-06-02 MED ORDER — LEVALBUTEROL HCL 1.25 MG/0.5ML IN NEBU
1.2500 mg | INHALATION_SOLUTION | RESPIRATORY_TRACT | Status: DC | PRN
  Filled 2015-06-02: qty 0.5

## 2015-06-02 NOTE — Progress Notes (Addendum)
Patient ID: Mario Mills, male   DOB: November 16, 1950, 65 y.o.   MRN: [id number]  [facility] PROGRESS NOTE  TYKESE Mills [id number] DOB: 10-May-1951 DOA: 05/31/2015 PCP: Philis Fendt, MD   Brief narrative:    65 y.o. year old male with end stage OA of his left knee with progressively worsening pain and dysfunction, admitted for left Total Knee Arthroplasty. Post op hypoxic and placed on BiPAP, [facility] consulted to evaluate post op hypoxia.   Major events since admission: 1/13 - post op hypoxia, ? PNA, pt tx to SDU, placed on BIPAP, started on Vanc and Maxipime for PNA, placed on Nitro drip for HTN-ive urgency  1/15 - improving but with persistently high SBP in 180's, still on Nitro drip, transitioned to oral Levaquin  1/16 - Tmax 100.1 F, worse pain in the left knee, WBC up 13.9 --> 15.6, Hg 11 -->9.8, SBP iin 180's on nitro drip   Assessment/Plan:    Principal Problem:  Acute respiratory failure with hypoxia (HCC) - with more dyspnea this AM, but has been off BiPAP since 1/15 - appears to be secondary to bilateral PNA, per CXR but since with tachycardia and more dyspnea this AM, order CT chest angio to rule out PE - pt also with crackles on exam and LE edema worrisome for pulmonary vascular congestion  - pt is on Levaquin, today is day #3 of ABX, still with Tmax 100.1 F - follow up on CT angio chest and if PNA looks works may need to put back on broader coverage, for now continue Levaquin  - also change lasix to IV today to help with vascular congestion, please see below   Active Problems:  Hypertensive urgency - pt on Coreg 12.5 mg BID, Cardizem 360 mg QD, lasix 40 mg PRN, lisinopril - HCTZ at home  - Coreg was held post op due to bradycardia but will resume today as HR stable and > 60 (1/15) - lisinopril - HCTZ was held pre op, will also resume today 1/15 - also added Hydralazine as needed and scheduled dosing - currently on nitro drip and hopefully will be able to  take off the drip if these medications changes improve BP - if no changes in next 24 hours, will need to consult pt's cardiologist Dr. Gwenlyn Found who pt saw several years ago     Pulmonary vascular congestion - last 2 D ECHO 04/2015 with normal EF - more vascular congestion on exam today, will change lasix to IV and monitor clinical response - weight 322 on admission, monitor daily weights, strict I/O - once more euvolemic, can change back to PO lasix     Acute post op blood loss anemia - monitor further Hg drop - no indication for transfusion at this time - CBC In AM   S/P left knee arthroscopy, post op day #2 - per ortho team  - monitor closely as pt with Tmax 100.1 F - Eliquis for DVT prophylaxis    Sepsis, unspecified organism ([place]) - post op 1/13, possibly related to bilateral PNA - started on vacn and maxipme post op and transitioned to oral Levaquin 1/14 - still with low grade temp, monitor closely - suspect that WBC up this AM from yesterday as pt has received dose of dexamethasone per ortho team   Bradycardia with 41 - 50 beats per minute - resolved, coreg to be resume today 1/15 12.5 mg PO BID as per home medical regimen    DM (diabetes mellitus) type II  controlled peripheral vascular disorder ([place]) - continue home medical regimen with insulin - holding oral antihyperglycemic regimen for now until oral intake improves    Morbid obesity due to excess calories (HCC) - Body mass index is 44.8 kg/(m^2).    OSA, OHS - CPAP at night time   DVT prophylaxis - pt on Apixaban per ortho   Code Status: Full.  Family Communication:  plan of care discussed with the patient Disposition Plan: Home vs SNF by 11/17 if able to get off Nitro drip and out of SDU   IV access:  Peripheral IV  Procedures and diagnostic studies:    Dg Chest Port 1 View 06/01/2015 Bilateral patchy airspace disease and hazy interstitial prominence. Findings highly suspicious for bilateral pneumonia or  bilateral pulmonary edema. No pneumothorax.  Medical Consultants:  [facility]  Other Consultants:  PT  IAnti-Infectives:   Vancomycin May 31, 2022 --> Maxipime 05/31/22 -->  [name], MD  [facility] Pager [phone]  If 7PM-7AM, please contact night-coverage [email or web address] Password [email or web address] 06/02/2015, 8:01 AM   LOS: 2 days   HPI/Subjective: No events overnight. More dyspnea this AM especially with exertion.   Objective: Filed Vitals:   06/02/15 0600 06/02/15 0700 06/02/15 0706 06/02/15 0745  BP: 205/78 215/71 215/71 197/100  Pulse: 88 89  102  Temp:      TempSrc:      Resp: 24 27  20   Height:      Weight:      SpO2: 98% 97%  96%    Intake/Output Summary (Last 24 hours) at 06/02/15 0801 Last data filed at 06/02/15 0700  Gross per 24 hour  Intake 645.05 ml  Output   1575 ml  Net -929.95 ml    Exam:   General:  Pt is alert, follows commands appropriately, not in acute distress  Cardiovascular: Regular rhythm, tachycardic, no rubs, no gallops  Respiratory: Crackles at bases with rhonchi, tachypnea   Abdomen: Soft, non tender, non distended, bowel sounds present, no guarding  Extremities: pulses DP and PT palpable bilaterally, + 1 bilateral LE edema with chronic venous stasis   Neuro: Grossly nonfocal  Data Reviewed: Basic Metabolic Panel:  Recent Labs Lab Jun 01, 2015 1710 06/01/15 0339 06/02/15 0405  NA 140 140 137  K 3.7 3.6 3.7  CL 101 101 102  CO2 29 28 27   GLUCOSE 171* 170* 182*  BUN 16 19 20   CREATININE 1.04 1.07 1.01  CALCIUM 8.7* 8.3* 8.2*   Liver Function Tests:  Recent Labs Lab 06/01/15 1710  AST 45*  ALT 68*  ALKPHOS 72  BILITOT 0.6  PROT 7.3  ALBUMIN 3.3*   CBC:  Recent Labs Lab June 01, 2015 1710 06/01/15 0339 06/02/15 0405  WBC 16.4* 13.9* 15.6*  NEUTROABS 15.0*  --   --   HGB 12.6* 11.1* 9.8*  HCT 40.6 35.4* 31.1*  MCV 93.3 93.4 91.5  PLT 350 327 288   CBG:  Recent Labs Lab 06/01/15 0822 06/01/15 1225 06/01/15 1814  06/01/15 2205 06/02/15 0741  GLUCAP 167* 236* 244* 228* 178*    Recent Results (from the past 240 hour(s))  Surgical pcr screen     Status: Abnormal   Collection Time: 05/24/15  4:34 PM  Result Value Ref Range Status   MRSA, PCR NEGATIVE NEGATIVE Final   Staphylococcus aureus POSITIVE (A) NEGATIVE Final    Comment:        The Xpert SA Assay (FDA approved for NASAL specimens in patients over 46 years of age), is one component of  a comprehensive surveillance program.  Test performance has been validated by [facility] for patients greater than or equal to 59 year old. It is not intended to diagnose infection nor to guide or monitor treatment.   MRSA PCR Screening     Status: None   Collection Time: 05/31/15  4:10 PM  Result Value Ref Range Status   MRSA by PCR NEGATIVE NEGATIVE Final    Comment:        The GeneXpert MRSA Assay (FDA approved for NASAL specimens only), is one component of a comprehensive MRSA colonization surveillance program. It is not intended to diagnose MRSA infection nor to guide or monitor treatment for MRSA infections.   Culture, blood (routine x 2) Call MD if unable to obtain prior to antibiotics being given     Status: None (Preliminary result)   Collection Time: 05/31/15  5:10 PM  Result Value Ref Range Status   Specimen Description RIGHT ANTECUBITAL  Final   Special Requests BOTTLES DRAWN AEROBIC AND ANAEROBIC 10CC  Final   Culture   Final    NO GROWTH < 24 HOURS Performed at [facility]    Report Status PENDING  Incomplete  Culture, blood (routine x 2) Call MD if unable to obtain prior to antibiotics being given     Status: None (Preliminary result)   Collection Time: 05/31/15  5:22 PM  Result Value Ref Range Status   Specimen Description BLOOD RIGHT ARM  Final   Special Requests BOTTLES DRAWN AEROBIC AND ANAEROBIC 10CC  Final   Culture   Final    NO GROWTH < 24 HOURS Performed at [facility]    Report Status PENDING   Incomplete     Scheduled Meds: . apixaban  2.5 mg Oral Q12H  . atorvastatin  20 mg Oral QHS  . diazepam  5 mg Oral BID  . diltiazem  360 mg Oral Daily  . docusate sodium  100 mg Oral BID  . furosemide  40 mg Oral Daily  . gabapentin  300 mg Oral BID  . hydrALAZINE  25 mg Oral 3 times per day  . insulin aspart  0-15 Units Subcutaneous TID WC  . insulin detemir  20 Units Subcutaneous QHS  . levofloxacin  750 mg Oral Daily  . oxybutynin  5 mg Oral BID   Continuous Infusions: . 0.9 % NaCl with KCl 20 mEq / L 10 mL/hr (05/31/15 1759)  . nitroGLYCERIN 105 mcg/min (06/02/15 0646)

## 2015-06-02 NOTE — Progress Notes (Signed)
Patient ID: Mario Mills, male   DOB: 1950/06/05, 65 y.o.   MRN: [id number]    Subjective: 2 Days Post-Op Procedure(s) (LRB): TOTAL LEFT  KNEE ARTHROPLASTY (Left) Patient reports pain as 3 on 0-10 scale.   Denies CP.  Voiding without difficulty. Positive flatus. Pt is still having high BP, productive cough and CPAP hs  Objective: Vital signs in last 24 hours: Temp:  [98.5 F (36.9 C)-100.7 F (38.2 C)] 100.1 F (37.8 C) (01/15 0400) Pulse Rate:  [77-108] 102 (01/15 0745) Resp:  [14-27] 20 (01/15 0745) BP: (132-215)/(52-100) 197/100 mmHg (01/15 0745) SpO2:  [89 %-99 %] 96 % (01/15 0745)  Intake/Output from previous day: 01/14 0701 - 01/15 0700 In: 645.1 [I.V.:645.1] Out: 1575 [Urine:1575] Intake/Output this shift:    Labs:  Recent Labs  05/31/15 1710 06/01/15 0339 06/02/15 0405  HGB 12.6* 11.1* 9.8*    Recent Labs  06/01/15 0339 06/02/15 0405  WBC 13.9* 15.6*  RBC 3.79* 3.40*  HCT 35.4* 31.1*  PLT 327 288    Recent Labs  06/01/15 0339 06/02/15 0405  NA 140 137  K 3.6 3.7  CL 101 102  CO2 28 27  BUN 19 20  CREATININE 1.07 1.01  GLUCOSE 170* 182*  CALCIUM 8.3* 8.2*    Recent Labs  05/31/15 1710  INR 1.05    Physical Exam: Neurologically intact ABD soft Sensation intact distally Dorsiflexion/Plantar flexion intact Incision: dressing C/D/I Compartment soft  Assessment/Plan: 2 Days Post-Op Procedure(s) (LRB): TOTAL LEFT  KNEE ARTHROPLASTY (Left) Up with therapy  Consider DC to SNF when medically stable Hospitalist on board  [place], Darla Lesches for Dr. Melina Schools [facility] [phone] 06/02/2015, 9:08 AM

## 2015-06-02 NOTE — Progress Notes (Signed)
PT Cancellation Note  Patient Details Name: Mario Mills MRN: [id number] DOB: 01-24-1951   Cancelled Treatment:    Reason Eval/Treat Not Completed: Medical issues which prohibited therapy (CT to R/O PE pending), attempt later as schedule and results of scan permit   [facility] 06/02/2015, 11:56 AM

## 2015-06-03 DIAGNOSIS — J9601 Acute respiratory failure with hypoxia: Secondary | ICD-10-CM

## 2015-06-03 DIAGNOSIS — I160 Hypertensive urgency: Secondary | ICD-10-CM

## 2015-06-03 LAB — BASIC METABOLIC PANEL
ANION GAP: 10 (ref 5–15)
BUN: 20 mg/dL (ref 6–20)
CALCIUM: 8.4 mg/dL — AB (ref 8.9–10.3)
CO2: 30 mmol/L (ref 22–32)
Chloride: 99 mmol/L — ABNORMAL LOW (ref 101–111)
Creatinine, Ser: 0.98 mg/dL (ref 0.61–1.24)
Glucose, Bld: 171 mg/dL — ABNORMAL HIGH (ref 65–99)
POTASSIUM: 3.5 mmol/L (ref 3.5–5.1)
Sodium: 139 mmol/L (ref 135–145)

## 2015-06-03 LAB — GLUCOSE, CAPILLARY
GLUCOSE-CAPILLARY: 219 mg/dL — AB (ref 65–99)
Glucose-Capillary: 173 mg/dL — ABNORMAL HIGH (ref 65–99)
Glucose-Capillary: 189 mg/dL — ABNORMAL HIGH (ref 65–99)
Glucose-Capillary: 224 mg/dL — ABNORMAL HIGH (ref 65–99)
Glucose-Capillary: 129 mg/dL — ABNORMAL HIGH (ref 65–99)
Glucose-Capillary: 220 mg/dL — ABNORMAL HIGH (ref 65–99)
Glucose-Capillary: 180 mg/dL — ABNORMAL HIGH (ref 65–99)

## 2015-06-03 LAB — CBC
HEMATOCRIT: 29.9 % — AB (ref 39.0–52.0)
Hemoglobin: 9.4 g/dL — ABNORMAL LOW (ref 13.0–17.0)
MCH: 29.1 pg (ref 26.0–34.0)
MCHC: 31.4 g/dL (ref 30.0–36.0)
MCV: 92.6 fL (ref 78.0–100.0)
PLATELETS: 275 10*3/uL (ref 150–400)
RBC: 3.23 MIL/uL — AB (ref 4.22–5.81)
RDW: 15.3 % (ref 11.5–15.5)
WBC: 14.5 10*3/uL — AB (ref 4.0–10.5)

## 2015-06-03 MED ORDER — ISOSORB DINITRATE-HYDRALAZINE 20-37.5 MG PO TABS
2.0000 | ORAL_TABLET | Freq: Two times a day (BID) | ORAL | Status: DC
  Administered 2015-06-03 – 2015-06-05 (×5): 2 via ORAL
  Filled 2015-06-03 (×7): qty 2

## 2015-06-03 MED ORDER — CETYLPYRIDINIUM CHLORIDE 0.05 % MT LIQD
7.0000 mL | Freq: Two times a day (BID) | OROMUCOSAL | Status: DC
  Administered 2015-06-03 – 2015-06-11 (×13): 7 mL via OROMUCOSAL

## 2015-06-03 MED ORDER — HYDRALAZINE HCL 50 MG PO TABS
50.0000 mg | ORAL_TABLET | Freq: Three times a day (TID) | ORAL | Status: DC
  Administered 2015-06-03: 50 mg via ORAL
  Filled 2015-06-03: qty 1

## 2015-06-03 MED ORDER — METOPROLOL TARTRATE 1 MG/ML IV SOLN
5.0000 mg | Freq: Once | INTRAVENOUS | Status: AC
  Administered 2015-06-03: 5 mg via INTRAVENOUS
  Filled 2015-06-03: qty 5

## 2015-06-03 MED ORDER — CHLORHEXIDINE GLUCONATE 0.12 % MT SOLN
15.0000 mL | Freq: Two times a day (BID) | OROMUCOSAL | Status: DC
  Administered 2015-06-03 – 2015-06-11 (×13): 15 mL via OROMUCOSAL
  Filled 2015-06-03 (×17): qty 15

## 2015-06-03 NOTE — Progress Notes (Signed)
Physical Therapy Treatment Patient Details Name: Mario Mills MRN: [id number] DOB: 07-Oct-1950 Today's Date: 06/03/2015    History of Present Illness Pt is a 65 year old male s/p L TKA with post op acute respiratory failure with hypoxia initially requiring BiPAP and hypertensive urgency (on nitro drip).  PMHx HTN, DM, PAD, OSA, obesity    PT Comments    Patient currently on nitro drip  With RN OK'd mobility. Patient is very weak, only tolerated transfer to recliner and there Ex. Plans for SNF. HR sats stable on RA.  Follow Up Recommendations  SNF     Equipment Recommendations  Rolling walker with 5" wheels    Recommendations for Other Services       Precautions / Restrictions Precautions Precautions: Fall;Knee Precaution Comments: monitor HR sats Required Braces or Orthoses: Knee Immobilizer - Left Knee Immobilizer - Left: Discontinue once straight leg raise with < 10 degree lag    Mobility  Bed Mobility Overal bed mobility: Needs Assistance;+2 for physical assistance Bed Mobility: Supine to Sit;Sit to Supine     Supine to sit: Mod assist;HOB elevated     General bed mobility comments: assist for L LE over EOB, assist for trunk upright,  extra time for mobility, patient requesting to as much as he can. Eventually needed assist with trunk.  Transfers Overall transfer level: Needs assistance Equipment used: Rolling walker (2 wheeled) Transfers: Sit to/from [facility] Sit to Stand: Mod assist;+2 physical assistance;+2 safety/equipment Stand pivot transfers: Max assist;+2 physical assistance;+2 safety/equipment       General transfer comment: verbal cues for UE and LE positioning, assist to rise and steady using bed raised, poor control of descent, small shuffle steps to turn to recliner, ecliner brought up as having difficulty backing up.  Ambulation/Gait                 Stairs            Wheelchair Mobility    Modified Rankin  (Stroke Patients Only)       Balance                                    Cognition Arousal/Alertness: Awake/alert Behavior During Therapy: WFL for tasks assessed/performed                        Exercises Total Joint Exercises Ankle Circles/Pumps: AROM;Both;10 reps Quad Sets: AROM;Both;10 reps Hip ABduction/ADduction: AAROM;Left;10 reps Long Arc Quad: AAROM;Left;10 reps Knee Flexion: AAROM;Left;10 reps Goniometric ROM: 10-50 l knee    General Comments        Pertinent Vitals/Pain Pain Score: 5  Pain Location: L knee with mobility Pain Descriptors / Indicators: Sore;Aching Pain Intervention(s): Limited activity within patient's tolerance;Monitored during session;Premedicated before session    Home Living                      Prior Function            PT Goals (current goals can now be found in the care plan section) Progress towards PT goals: Progressing toward goals    Frequency  7X/week    PT Plan Current plan remains appropriate    Co-evaluation             End of Session Equipment Utilized During Treatment: Gait belt Activity Tolerance: Patient limited by fatigue;Patient limited by pain Patient  left: in chair;with call bell/phone within reach     Time: 1035-1111 PT Time Calculation (min) (ACUTE ONLY): 36 min  Charges:  $Gait Training: 8-22 mins $Therapeutic Exercise: 8-22 mins                    G Codes:      Claretha Cooper 06/03/2015, 12:02 PM Tresa Endo PT [phone]

## 2015-06-03 NOTE — Progress Notes (Signed)
Date: June 03, 2015 Chart reviewed for concurrent status and case management needs. Will continue to follow patient for changes and needs:  Continues to need bipap at night, pain control not met, hypotensive Velva Harman, RN, BSN, [place]   [phone]

## 2015-06-03 NOTE — Progress Notes (Signed)
Physical Therapy Treatment Patient Details Name: URIAN DIBERARDINO MRN: [id number] DOB: Feb 15, 1951 Today's Date: 06/03/2015    History of Present Illness Pt is a 65 year old male s/p L TKA with post op acute respiratory failure with hypoxia initially requiring BiPAP and hypertensive urgency (on nitro drip).  PMHx HTN, DM, PAD, OSA, obesity    PT Comments    Slowly progressing, requires extensive assist for sit to stand from l;ow surface. Patient is motivated to  Participate. HR 88, sats > 955  Follow Up Recommendations  SNF     Equipment Recommendations  Rolling walker with 5" wheels    Recommendations for Other Services       Precautions / Restrictions Precautions Precautions: Fall;Knee Precaution Comments: monitor HR sats Required Braces or Orthoses: Knee Immobilizer - Left Knee Immobilizer - Left: Discontinue once straight leg raise with < 10 degree lag    Mobility  Bed Mobility   Bed Mobility: Sit to Supine       Sit to supine: Mod assist;+2 for safety/equipment   General bed mobility comments: assist with legs and trunk  Transfers Overall transfer level: Needs assistance Equipment used: Rolling walker (2 wheeled) Transfers: Sit to/from [facility] Sit to Stand: Mod assist;+2 physical assistance;+2 safety/equipment Stand pivot transfers: Mod assist;+2 physical assistance;+2 safety/equipment       General transfer comment: assist to power up from recliner, cues for hand placement, much effort to stand from low surface. pivot steps back to the bed.  Ambulation/Gait                 Stairs            Wheelchair Mobility    Modified Rankin (Stroke Patients Only)       Balance                                    Cognition Arousal/Alertness: Awake/alert                          Exercises      General Comments        Pertinent Vitals/Pain Pain Score: 3  Pain Location: L knee Pain  Intervention(s): Limited activity within patient's tolerance;Premedicated before session;Repositioned    Home Living                      Prior Function            PT Goals (current goals can now be found in the care plan section) Progress towards PT goals: Progressing toward goals    Frequency  7X/week    PT Plan Current plan remains appropriate    Co-evaluation             End of Session Equipment Utilized During Treatment: Gait belt;Left knee immobilizer Activity Tolerance: Patient limited by fatigue;Patient limited by pain Patient left: in bed;with call bell/phone within reach;with bed alarm set;with family/visitor present     Time: 1340-1400 PT Time Calculation (min) (ACUTE ONLY): 20 min  Charges:  $Therapeutic Activity: 8-22 mins                    G Codes:      Claretha Cooper 06/03/2015, 5:01 PM Tresa Endo PT [phone]

## 2015-06-03 NOTE — Consult Note (Signed)
CARDIOLOGY CONSULT NOTE   Patient ID: Mario Mills MRN: [id number] DOB/AGE: 66-Jan-1952 66 y.o.  Admit date: 05/31/2015  Primary Physician   Mario Fendt, MD Primary Cardiologist   Dr Mario Mills Reason for Consultation: HTN, CHF    [id number] Mario Mills is a 65 y.o. year old male with a history of HTN, DM, HL, OSA on CPAP, PAD, afib, was admitted 01/10 for L TKA, post-op resp failure req BiPAP, PNA, HTN. Cards consulted to help w/ HTN and volume.   Pt aware he had fluid after the surgery. He reports more DOE, orthopnea after the surgery. He is better now, has chronic 2-pillow orthopnea and sleeps with feet elevated. No PND because of the CPAP.    Rare palpitations, no chest pain. Can hear his heart beating. Does not have scales at home and does not weigh self. Occasional LE edema PTA, not much. Chronic DOE, could walk about 150 ft (was using a cane) without stopping for breath. No recent change.A1c usually in the mid-6's. SBP generally controlled in MD offices, 130s, no BP cuff at home.   Has fallen several times. Orthostatic presyncope and falls, never completely lost consciousness. This would happen if he bent over and stayed there, then when he stood up, presyncope would cause him to fall. No palpitations with these episodes. Has had a heart murmur a long time.  Past Medical History  Diagnosis Date  . Hypertension   . Diabetes mellitus   . Hyperlipidemia   . Obstructive sleep apnea     CPAP  . Peripheral arterial disease ([place])   . Venous insufficiency   . Venous stasis ulcer ([place])   . DOE (dyspnea on exertion)     WITH MINIMAL EXERTION  . HA (headache)     WAKE UP FREQUENTLY WITH HA  . History of Doppler ultrasound 12/2012    LE VENOUS REFLUX demostrated on L (vein stripping on R w/MRSA); h/o venous stasis bilat   . Atrial fibrillation ([place])   . Heart murmur   . Dysrhythmia   . History of urinary tract infection   . Urinary incontinence   . Arthritis   . Numbness  and tingling     feet bilat   . Falls      Past Surgical History  Procedure Laterality Date  . Colon resection  2007  . Transthoracic echocardiogram  07/02/2005    mod concentric LVH; LV borderline dilated; LA mild-mod dilated; mild MR & TR  . Varicose vein surgery      R leg  . Colon surgery    . Tonsillectomy    . Total knee arthroplasty Left 05/31/2015    Procedure: TOTAL LEFT  KNEE ARTHROPLASTY;  Surgeon: Mario Arabian, MD;  Location: [facility];  Service: Orthopedics;  Laterality: Left;    No Known Allergies  I have reviewed the patient's current medications . antiseptic oral rinse  7 mL Mouth Rinse q12n4p  . apixaban  2.5 mg Oral Q12H  . atorvastatin  20 mg Oral QHS  . carvedilol  12.5 mg Oral BID WC  . chlorhexidine  15 mL Mouth Rinse BID  . diazepam  5 mg Oral BID  . diltiazem  360 mg Oral Daily  . docusate sodium  100 mg Oral BID  . [START ON 06/04/2015] furosemide  40 mg Intravenous Daily  . gabapentin  300 mg Oral BID  . hydrALAZINE  50 mg Oral 3 times per day  . lisinopril  20 mg  Oral Daily   And  . hydrochlorothiazide  12.5 mg Oral Daily  . insulin aspart  0-15 Units Subcutaneous TID WC  . insulin detemir  20 Units Subcutaneous QHS  . levofloxacin  750 mg Oral Daily  . oxybutynin  5 mg Oral BID   . 0.9 % NaCl with KCl 20 mEq / L 10 mL/hr (05/31/15 1759)  . nitroGLYCERIN 40 mcg/min (06/03/15 1301)   acetaminophen **OR** acetaminophen, bisacodyl, diphenhydrAMINE, gi cocktail, guaiFENesin-dextromethorphan, hydrALAZINE, HYDROmorphone (DILAUDID) injection, levalbuterol, menthol-cetylpyridinium **OR** phenol, methocarbamol **OR** methocarbamol (ROBAXIN)  IV, metoCLOPramide **OR** metoCLOPramide (REGLAN) injection, ondansetron **OR** ondansetron (ZOFRAN) IV, oxyCODONE, polyethylene glycol, sodium phosphate, traMADol  Prior to Admission medications   Medication Sig Start Date End Date Taking? Authorizing Provider  albuterol (PROVENTIL HFA;VENTOLIN HFA) 108 (90 BASE)  MCG/ACT inhaler Inhale 2 puffs into the lungs every 4 (four) hours as needed for wheezing or shortness of breath (cough, shortness of breath or wheezing.). 08/15/14  Yes Mario Culver, MD  Aspirin-Caffeine (BAYER BACK & BODY PO) Take 2 tablets by mouth 2 (two) times daily.   Yes Historical Provider, MD  carvedilol (COREG) 12.5 MG tablet Take 12.5 mg by mouth 2 (two) times daily with a meal.   Yes Historical Provider, MD  Cinnamon 500 MG capsule Take 500 mg by mouth daily.    Yes Historical Provider, MD  COENZYME Q-10 PO Take 1 tablet by mouth daily.   Yes Historical Provider, MD  diazepam (VALIUM) 5 MG tablet Take 5 mg by mouth 2 (two) times daily.  04/24/15  Yes Historical Provider, MD  Diclofenac-Misoprostol 75-0.2 MG TBEC Take 1 tablet by mouth 2 (two) times daily.    Yes Historical Provider, MD  DILTIAZEM HCL CD 360 MG 24 hr capsule Take 360 mg by mouth daily.  12/10/12  Yes Historical Provider, MD  furosemide (LASIX) 40 MG tablet Take 40 mg by mouth daily as needed for fluid.    Yes Historical Provider, MD  gabapentin (NEURONTIN) 300 MG capsule Take 300 mg by mouth 2 (two) times daily.   Yes Historical Provider, MD  glimepiride (AMARYL) 4 MG tablet Take 4 mg by mouth daily before breakfast.   Yes Historical Provider, MD  HYDROmorphone (DILAUDID) 4 MG tablet TAKE 1 TABLET BY MOUTH twice daily as needed for pain 04/24/15  Yes Historical Provider, MD  KRILL OIL PO Take 1 capsule by mouth daily.   Yes Historical Provider, MD  L-Arginine 500 MG CAPS Take 1,000 mg by mouth daily.   Yes Historical Provider, MD  LEVEMIR FLEXTOUCH 100 UNIT/ML Pen Inject 20 Units into the skin at bedtime. 05/10/15  Yes Historical Provider, MD  lisinopril-hydrochlorothiazide (PRINZIDE,ZESTORETIC) 20-12.5 MG per tablet Take 1 tablet by mouth daily.   Yes Historical Provider, MD  metFORMIN (GLUCOPHAGE) 1000 MG tablet Take 1,000 mg by mouth 2 (two) times daily with a meal.   Yes Historical Provider, MD  Multiple Vitamin  (MULTIVITAMIN WITH MINERALS) TABS Take 1 tablet by mouth daily.   Yes Historical Provider, MD  oxybutynin (DITROPAN) 5 MG tablet Take 5 mg by mouth 2 (two) times daily. 04/22/15  Yes Historical Provider, MD  potassium chloride SA (K-DUR,KLOR-CON) 20 MEQ tablet Take 20 mEq by mouth 2 (two) times daily as needed (take with lasix).    Yes Historical Provider, MD  simvastatin (ZOCOR) 40 MG tablet Take 40 mg by mouth at bedtime.  12/05/12  Yes Historical Provider, MD  triamcinolone cream (KENALOG) 0.1 % Apply 1 application topically daily as needed.  05/01/15  Yes Historical Provider, MD  vitamin E 1000 UNIT capsule Take 1,000 Units by mouth daily.   Yes Historical Provider, MD     Social History   Social History  . Marital Status: Married    Spouse Name: N/A  . Number of Children: 2  . Years of Education: post grad   Occupational History  . 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 Smoking status: Former Smoker -- 1.00 packs/day for 35 years    Types: Cigarettes    Quit date: 07/25/2005  . Smokeless tobacco: Never Used  . Alcohol Use: 0.0 oz/week    0.5 Standard drinks or equivalent per week     Comment: rarely   . Drug Use: No  . Sexual Activity: Not on file   Other Topics Concern  . Not on file   Social History Narrative    Family Status  Relation Status Death Age  . Father Deceased 2    PANCREATIC CANCER  . Mother Deceased 34    HTN, heart problems   Family History  Problem Relation Age of Onset  . Pancreatic cancer Father   . Cancer Father   . Heart Problems Mother   . Hypertension Mother   . Diabetes Brother   . Hypertension Brother      ROS:  Full 14 point review of systems complete and Mills to be negative unless listed above.  Physical Exam: Blood pressure 162/57, pulse 74, temperature 99.5 F (37.5 C), temperature source Oral, resp. rate 17, height 6\' 1"  (1.854 m), weight 316 lb 5.8 oz (143.5 kg), SpO2 98 %.    General: Well developed, well nourished, male in no acute distress Head: Eyes PERRLA, No xanthomas.   Normocephalic and atraumatic, oropharynx without edema or exudate. Dentition: good  Lungs: few rales bases, good air exchange Heart: HRRR S1 S2, no rub/gallop, 2/6 murmur. pulses are 2+ extrem.   Neck: No carotid bruits. No lymphadenopathy.  JVD difficult to assess 2nd body habitus, no HJ reflux seen. Abdomen: Bowel sounds present, abdomen soft and non-tender without masses or hernias noted. Msk:  No spine or cva tenderness. No new weakness, no joint deformities or effusions. L knee post-op, not disturbed Extremities: No clubbing or cyanosis. trace edema.  Neuro: Alert and oriented X 3. No focal deficits noted. Psych:  Good affect, responds appropriately Skin: No rashes or lesions noted.  Labs:   Lab Results  Component Value Date   WBC 14.5* 06/03/2015   HGB 9.4* 06/03/2015   HCT 29.9* 06/03/2015   MCV 92.6 06/03/2015   PLT 275 06/03/2015    Recent Labs  05/31/15 1710  INR 1.05    Recent Labs Lab 05/31/15 1710  06/03/15 0340  NA 140  < > 139  K 3.7  < > 3.5  CL 101  < > 99*  CO2 29  < > 30  BUN 16  < > 20  CREATININE 1.04  < > 0.98  CALCIUM 8.7*  < > 8.4*  PROT 7.3  --   --   BILITOT 0.6  --   --   ALKPHOS 72  --   --   ALT 68*  --   --   AST 45*  --   --   GLUCOSE 171*  < > 171*  ALBUMIN 3.3*  --   --   < > = values in this interval not displayed.   Echo:  05/15/2015 - Left ventricle: The cavity size was normal. There was moderate concentric hypertrophy. Systolic function was normal. The estimated ejection fraction was in the range of 50% to 55%. Wall motion was normal; there were no regional wall motion abnormalities. - Aortic valve: There was mild stenosis. - Left atrium: The atrium was severely dilated. - Right ventricle: The cavity size was normal. Wall thickness was normal. Systolic function was normal.  ECG:  06/03/2015 SR, 3 bt run  NSVT  Radiology:  Ct Angio Chest Pe W/cm &/or Wo Cm 06/02/2015  CLINICAL DATA:  Patient admitted for total knee arthroplasty. Postop hypoxia and shortness of breath. Evaluate for pulmonary embolism. EXAM: CT ANGIOGRAPHY CHEST WITH CONTRAST TECHNIQUE: Multidetector CT imaging of the chest was performed using the standard protocol during bolus administration of intravenous contrast. Multiplanar CT image reconstructions and MIPs were obtained to evaluate the vascular anatomy. CONTRAST:  100 mL Omnipaque 300 IV. COMPARISON:  Chest x-ray 05/31/2015 and abdominal CT 05/08/2011 FINDINGS: The lungs are well inflated with subtle hazy density adjacent the major fissure over the posterior right upper lobe likely dependent atelectasis. There has been resolution of the bilateral central airspace process seen on the recent chest x-ray likely resolved interstitial edema. There is a small amount of aspirate material along the right lateral wall lead tip of the distal trachea just above the carina. There is mild cardiomegaly. There is minimal calcified plaque over the thoracic aorta. There is no evidence of pulmonary embolism. No evidence of mediastinal or hilar adenopathy. No significant axillary adenopathy. Images through the upper abdomen demonstrate possible sub cm cyst over the upper pole right kidney unchanged. There mild degenerate changes of the spine. Review of the MIP images confirms the above findings. IMPRESSION: No evidence of pulmonary embolism. Interval clearing of the previously noted bilateral central airspace process on recent chest radiograph likely resolved edema. Minimal residual hazy density over the posterior right upper lobe likely atelectasis. Small amount of aspirate material along the right lateral wall of the distal trachea just above the carina. Mild cardiomegaly. Electronically Signed   By: Marin Olp M.D.   On: 06/02/2015 15:45    ASSESSMENT AND PLAN:   The patient was seen today by Dr  Percival Spanish, the patient evaluated and the data reviewed.  Principal Problem:   Acute respiratory failure with hypoxia (HCC) - improved, per Ortho/IM  Active Problems:   Essential hypertension - currently on Coreg 12.5 bid, Cardizem CD 360 mg, hydralazine 50 mg tid (increased today), lisinopril/HCTZ 20/12.5 - All except the hydralazine are home meds at home doses. - weight has decreased last 48 hours, tolerated diuresis well.      Obstructive sleep apnea - compliant w/ CPAP    Venous stasis ulcers of both lower extremities - per IM    Hypertensive urgency - improved w/ IV Lasix (2 doses only) and hydralazine plus home rx    S/P left knee arthroscopy - per Ortho    DM (diabetes mellitus) type II controlled peripheral vascular disorder ([place]) - per Ortho/IM    Morbid obesity due to excess calories ([place]) - improved, per Ortho/IM    Bradycardia with 41 - 50 beats per minute - on 01/13 pt had sinus bradycardia in the 50s, may have dropped briefly into the 40s, not sustained - pt also had episodic idioventricular rhythm, rate 60s, asymptomatic, no asleep. - with Dilt 360 and Coreg 12.5 bid, watch on telemetry     Sepsis, unspecified organism ([place]) - improved, per Ortho/IM  Pulmonary vascular congestion - improved w/ IV Lasix - now back on home dose HCTZ - has PRN Lasix, consider giving a daily dose and changing lisinopril/HCTZ to lisinopril.    Postoperative anemia due to acute blood loss - improved, per Ortho/IM   SignedRosaria Ferries, PA-C 06/03/2015 1:02 PM Beeper [id number]  Co-Sign MD  History and all data above reviewed.  Patient examined.  I agree with the findings as above.  The patient reports that his BP is usually well controlled.  He does not report chest pain although he does have chronic dypsnea The patient exam reveals COR:RRR, systolic murmur  ,  Lungs: Clear  ,  Abd: Obese, Ext Mild edema in the thights  .  All available labs, radiology testing,  previous records reviewed. Agree with documented assessment and plan. HTN:  Difficult to control.  I will change to BiDil two bid.  Next add spironolactone.  Would not increase beta blocker or Cardizem.  He does have some bradycardia and one episode of idioventricular rhythm.  I do not suspect a symptomatic arrhythmia.  Dyspnea:  I suspect that this is multifactorial. There might be some component of diastolic dysfunction.  I suspect some of his DOE was secondary to deconditioning, obesity, HTN and joint pain.      Jeneen Rinks Satonya Lux  2:32 PM  06/03/2015

## 2015-06-03 NOTE — Clinical Social Work Placement (Signed)
   CLINICAL SOCIAL WORK PLACEMENT  NOTE  Date:  06/03/2015  Patient Details  Name: Mario Mills MRN: [id number] Date of Birth: 1951/01/26  Clinical Social Work is seeking post-discharge placement for this patient at the [place] level of care (*CSW will initial, date and re-position this form in  chart as items are completed):  No   Patient/family provided with [facility] Work Department's list of facilities offering this level of care within the geographic area requested by the patient (or if unable, by the patient's family).  Yes   Patient/family informed of their freedom to choose among providers that offer the needed level of care, that participate in Medicare, Medicaid or managed care program needed by the patient, have an available bed and are willing to accept the patient.  No   Patient/family informed of [facility]'s ownership interest in [facility] and [facility], as well as of the fact that they are under no obligation to receive care at these facilities.  PASRR submitted to EDS on 06/03/15     PASRR number received on 06/03/15     Existing PASRR number confirmed on       FL2 transmitted to all facilities in geographic area requested by pt/family on 06/03/15     FL2 transmitted to all facilities within larger geographic area on       Patient informed that his/her managed care company has contracts with or will negotiate with certain facilities, including the following:        Yes   Patient/family informed of bed offers received.  Patient chooses bed at [facility]     Physician recommends and patient chooses bed at [facility]    Patient to be transferred to   on  .  Patient to be transferred to facility by       Patient family notified on   of transfer.  Name of family member notified:        PHYSICIAN       Additional Comment:    _______________________________________________ Loraine Maple   [phone] 06/03/2015, 3:45 PM

## 2015-06-03 NOTE — Progress Notes (Signed)
PROGRESS NOTE  Mario Mills [id number] DOB: August 02, 1950 DOA: 06/08/2015 PCP: Philis Fendt, MD  HPI 65 y.o. year old male with end stage OA of his left knee with progressively worsening pain and dysfunction, admitted for left Total Knee Arthroplasty. Post op hypoxic and placed on BiPAP, TRH consulted to evaluate post op hypoxia.   Interval events 2022/06/07 - post op hypoxia, ? PNA, pt tx to SDU, placed on BIPAP, started on Vanc and Maxipime for PNA, placed on Nitro drip for HTN-ive urgency  1/15 - improving but with persistently high SBP in 180's, still on Nitro drip, transitioned to oral Levaquin  1/16 - Tmax 100.1 F, worse pain in the left knee, WBC up 13.9 --> 15.6, Hg 11 -->9.8, SBP iin 180's on nitro drip   Subjective: - no chest pain, shortness of breath, no abdominal pain, nausea or vomiting.    Assessment / Plan Acute respiratory failure with hypoxia (HCC) - on room air this morning, denies dyspnea - appears to be secondary to bilateral PNA, per CXR.  - CT angio done 1/15 without evidence of PE and improvement of airspace processes - pt is on Levaquin, today is day #4 of ABX, fever curve improving - continue Lasix  Hypertensive urgency - pt on Coreg 12.5 mg BID, Cardizem 360 mg QD, lasix 40 mg PRN, lisinopril - HCTZ at home  - Coreg was held post op due to bradycardia but was resumed 1/15 - lisinopril - HCTZ was held pre op, was resumed 1/15 - also added Hydralazine 1/15, increase to 50 TID today. BP still elevated and on Nitro gtt at 80 with systolics in the [id number].  - Consulted cardiology today to assist, appreciate input  Pulmonary vascular congestion - last 2 D ECHO 04/2015 with normal EF - more vascular congestion on exam today, will change lasix to IV and monitor clinical response - weight 322 on admission, monitor daily weights, strict I/O - once more euvolemic, can change back to PO lasix   Acute post op blood loss anemia - monitor further Hg drop - no  indication for transfusion at this time - CBC in AM  S/P left knee arthroscopy, post op day #2 - per ortho team  - Eliquis for DVT prophylaxis   Sepsis, unspecified organism ([place]) - post op Jun 07, 2022, possibly related to bilateral PNA - started on vacn and maxipme post op and transitioned to oral Levaquin 1/14 - WBC improving some, still elevated, perhaps related to steroids  Bradycardia with 41 - 50 beats per minute - resolved  DM (diabetes mellitus) type II controlled peripheral vascular disorder (HCC) - continue home medical regimen with insulin - holding oral antihyperglycemic regimen for now until oral intake improves   Morbid obesity due to excess calories (HCC) - Body mass index is 44.8 kg/(m^2).  OSA, OHS - CPAP at night time   DVT prophylaxis - pt on Apixaban per ortho   Code Status: Full.  Family Communication:  plan of care discussed with the patient Disposition Plan: Home vs SNF when able to get off Nitro drip and out of SDU   Procedures and diagnostic studies:    Dg Chest Port 1 View 06/08/2015 Bilateral patchy airspace disease and hazy interstitial prominence. Findings highly suspicious for bilateral pneumonia or bilateral pulmonary edema. No pneumothorax.  CT chest 1/15 No evidence of pulmonary embolism. Interval clearing of the previously noted bilateral central airspace process on recent chest radiograph likely resolved edema. Minimal residual hazy density over the posterior  right upper lobe likely atelectasis. Small amount of aspirate material along the right lateral wall of the distal trachea just above the carina.  Medical Consultants:  TRH  Other Consultants:  PT  IAnti-Infectives:   Vancomycin 01/13 --> 1/14 Maxipime 01/13 --> 1/14  Levaquin 1/14 >>   Objective: Filed Vitals:   06/03/15 0800 06/03/15 0836 06/03/15 0913 06/03/15 0915  BP: 162/60 162/60 162/57 162/57  Pulse: 69 74    Temp: 97.5 F (36.4 C)     TempSrc: Oral     Resp: 17       Height:      Weight:  143.5 kg (316 lb 5.8 oz)    SpO2: 98%       Intake/Output Summary (Last 24 hours) at 06/03/15 1148 Last data filed at 06/03/15 1000  Gross per 24 hour  Intake 1647.58 ml  Output   4820 ml  Net -3172.42 ml   Exam:   General:  Pt is alert, follows commands appropriately, not in acute distress  Cardiovascular: Regular rhythm, tachycardic, no rubs, no gallops  Respiratory: Crackles at bases with rhonchi, tachypnea   Abdomen: Soft, non tender, non distended, bowel sounds present, no guarding  Extremities: pulses DP and PT palpable bilaterally, + 1 bilateral LE edema with chronic venous stasis   Neuro: Grossly nonfocal  Data Reviewed: Basic Metabolic Panel:  Recent Labs Lab 05/31/15 1710 06/01/15 0339 06/02/15 0405 06/03/15 0340  NA 140 140 137 139  K 3.7 3.6 3.7 3.5  CL 101 101 102 99*  CO2 29 28 27 30   GLUCOSE 171* 170* 182* 171*  BUN 16 19 20 20   CREATININE 1.04 1.07 1.01 0.98  CALCIUM 8.7* 8.3* 8.2* 8.4*   Liver Function Tests:  Recent Labs Lab 05/31/15 1710  AST 45*  ALT 68*  ALKPHOS 72  BILITOT 0.6  PROT 7.3  ALBUMIN 3.3*   CBC:  Recent Labs Lab 05/31/15 1710 06/01/15 0339 06/02/15 0405 06/03/15 0340  WBC 16.4* 13.9* 15.6* 14.5*  NEUTROABS 15.0*  --   --   --   HGB 12.6* 11.1* 9.8* 9.4*  HCT 40.6 35.4* 31.1* 29.9*  MCV 93.3 93.4 91.5 92.6  PLT 350 327 288 275   CBG:  Recent Labs Lab 06/02/15 1647 06/02/15 2052 06/02/15 2345 06/03/15 0406 06/03/15 0753  GLUCAP 237* 189* 224* 173* 129*    Recent Results (from the past 240 hour(s))  Surgical pcr screen     Status: Abnormal   Collection Time: 05/24/15  4:34 PM  Result Value Ref Range Status   MRSA, PCR NEGATIVE NEGATIVE Final   Staphylococcus aureus POSITIVE (A) NEGATIVE Final    Comment:        The Xpert SA Assay (FDA approved for NASAL specimens in patients over 51 years of age), is one component of a comprehensive surveillance program.  Test  performance has been validated by [facility] for patients greater than or equal to 49 year old. It is not intended to diagnose infection nor to guide or monitor treatment.   MRSA PCR Screening     Status: None   Collection Time: 05/31/15  4:10 PM  Result Value Ref Range Status   MRSA by PCR NEGATIVE NEGATIVE Final    Comment:        The GeneXpert MRSA Assay (FDA approved for NASAL specimens only), is one component of a comprehensive MRSA colonization surveillance program. It is not intended to diagnose MRSA infection nor to guide or monitor treatment for MRSA  infections.   Culture, blood (routine x 2) Call MD if unable to obtain prior to antibiotics being given     Status: None (Preliminary result)   Collection Time: 05/31/15  5:10 PM  Result Value Ref Range Status   Specimen Description RIGHT ANTECUBITAL  Final   Special Requests BOTTLES DRAWN AEROBIC AND ANAEROBIC 10CC  Final   Culture   Final    NO GROWTH 3 DAYS Performed at [facility]    Report Status PENDING  Incomplete  Culture, blood (routine x 2) Call MD if unable to obtain prior to antibiotics being given     Status: None (Preliminary result)   Collection Time: 05/31/15  5:22 PM  Result Value Ref Range Status   Specimen Description BLOOD RIGHT ARM  Final   Special Requests BOTTLES DRAWN AEROBIC AND ANAEROBIC 10CC  Final   Culture   Final    NO GROWTH 3 DAYS Performed at [facility]    Report Status PENDING  Incomplete     Scheduled Meds: . antiseptic oral rinse  7 mL Mouth Rinse q12n4p  . apixaban  2.5 mg Oral Q12H  . atorvastatin  20 mg Oral QHS  . carvedilol  12.5 mg Oral BID WC  . chlorhexidine  15 mL Mouth Rinse BID  . diazepam  5 mg Oral BID  . diltiazem  360 mg Oral Daily  . docusate sodium  100 mg Oral BID  . [START ON 06/04/2015] furosemide  40 mg Intravenous Daily  . gabapentin  300 mg Oral BID  . hydrALAZINE  50 mg Oral 3 times per day  . lisinopril  20 mg Oral Daily   And    . hydrochlorothiazide  12.5 mg Oral Daily  . insulin aspart  0-15 Units Subcutaneous TID WC  . insulin detemir  20 Units Subcutaneous QHS  . levofloxacin  750 mg Oral Daily  . oxybutynin  5 mg Oral BID   Continuous Infusions: . 0.9 % NaCl with KCl 20 mEq / L 10 mL/hr (05/31/15 1759)  . nitroGLYCERIN 80 mcg/min (06/03/15 [id number])     Marzetta Board, MD  Pager [phone]  If 7PM-7AM, please contact night-coverage [email or web address] Password [facility] 06/03/2015, 11:48 AM

## 2015-06-03 NOTE — Progress Notes (Signed)
Inpatient Diabetes Program Recommendations  AACE/[name]: New Consensus Statement on Inpatient Glycemic Control (2015)  Target Ranges:  Prepandial:   less than 140 mg/dL      Peak postprandial:   less than 180 mg/dL (1-2 hours)      Critically ill patients:  140 - 180 mg/dL    Results for MAAN, RYMER (MRN [id number]) as of 06/03/2015 09:19  Ref. Range 06/02/2015 07:41 06/02/2015 12:44 06/02/2015 16:47 06/02/2015 20:52  Glucose-Capillary Latest Ref Range: 65-99 mg/dL 178 (H) 285 (H) 237 (H) 189 (H)    Results for WATKINS, LACHAT (MRN [id number]) as of 06/03/2015 09:19  Ref. Range 06/03/2015 07:53  Glucose-Capillary Latest Ref Range: 65-99 mg/dL 129 (H)    Home DM Meds: Levemir 20 units QHS       Amaryl 4 mg daily       Metformin 1000 mg bid  Current Insulin Orders: Levemir 20 units QHS      Novolog Moderate SSI (0-15 units) TID AC     MD- Fasting glucose well controlled on current dose of Levemir.  Patient having some elevated postprandial glucose levels.  Please consider starting low dose Novolog Meal Coverage while patient's home Metformin and Amaryl are on hold-   Novolog 4 units tid with meals     --Will follow patient during hospitalization--  Wyn Quaker RN, MSN, CDE Diabetes Coordinator Inpatient Glycemic Control Team Team Pager: [phone] (8a-5p)

## 2015-06-03 NOTE — Clinical Social Work Note (Signed)
Clinical Social Work Assessment  Patient Details  Name: Mario Mills MRN: [id number] Date of Birth: 02-19-1951  Date of referral:  06/03/15               Reason for consult:  Family Concerns, Discharge Planning                Permission sought to share information with:  Chartered certified accountant granted to share information::  Yes, Verbal Permission Granted  Name::        Agency::     Relationship::     Contact Information:     Housing/Transportation Living arrangements for the past 2 months:  Single Family Home Source of Information:  Patient Patient Interpreter Needed:  None Criminal Activity/Legal Involvement Pertinent to Current Situation/Hospitalization:  No - Comment as needed Significant Relationships:  Spouse Lives with:    Do you feel safe going back to the place where you live?   (ST rehab has been recommended.) Need for family participation in patient care:  No (Coment)  Care giving concerns: Pt's care cannot be managed at home following hospital d/c.   Social Worker assessment / plan:  Pt hospitalized on 05/31/15 for pre planned left total knee arthroplasty. CSW met with pt to assist with d/c planning. Pt has made prior arrangements to have ST Rehab at [facility] following hospital d/c. PT has  recommended SNF placement at d/c. CSW has contacted SNF and d/c plans have been confirmed. CSW will continue to follow to assist with d/c planning to SNF.  Employment status:  Retired Forensic scientist:  Medicare PT Recommendations:  [place] / Referral to community resources:  [place]  Patient/Family's Response to care:  Pt feels ST Rehab is needed.  Patient/Family's Understanding of and Emotional Response to Diagnosis, Current Treatment, and Prognosis:  Pt is aware of his medical status. He is motivated to work with therapy and is looking forward to having rehab at [facility].  Emotional  Assessment Appearance:  Appears stated age Attitude/Demeanor/Rapport:  Other (cooperative) Affect (typically observed):  Calm, Pleasant Orientation:  Oriented to Self, Oriented to Place, Oriented to  Time, Oriented to Situation Alcohol / Substance use:  Not Applicable Psych involvement (Current and /or in the community):  No (Comment)  Discharge Needs  Concerns to be addressed:  Discharge Planning Concerns Readmission within the last 30 days:  No Current discharge risk:  None Barriers to Discharge:  No Barriers Identified   Loraine Maple  [phone] 06/03/2015, 3:40 PM

## 2015-06-03 NOTE — NC FL2 (Signed)
[place] LEVEL OF CARE SCREENING TOOL     IDENTIFICATION  Patient Name: Mario Mills Birthdate: Feb 26, 1951 Sex: male Admission Date (Current Location): 05/31/2015  [facility] and [place] Number:  Herbalist and Address:  [facility],  [place] [address] [place]      Provider Number: [id number]  Attending Physician Name and Address:  Gaynelle Arabian, MD  Relative Name and Phone Number:       Current Level of Care: Hospital Recommended Level of Care: [place] Prior Approval Number:    Date Approved/Denied:   PASRR Number: [id number]  Discharge Plan: SNF    Current Diagnoses: Patient Active Problem List   Diagnosis Date Noted  . Pulmonary vascular congestion 06/02/2015  . Postoperative anemia due to acute blood loss 06/02/2015  . Hypertensive urgency 05/31/2015  . Acute respiratory failure with hypoxia ([place]) 05/31/2015  . S/P left knee arthroscopy 05/31/2015  . DM (diabetes mellitus) type II controlled peripheral vascular disorder ([place]) 05/31/2015  . Morbid obesity due to excess calories ([place]) 05/31/2015  . Bradycardia with 41 - 50 beats per minute 05/31/2015  . Sepsis, unspecified organism ([place]) 05/31/2015  . Venous stasis ulcers of both lower extremities 03/28/2014  . Essential hypertension 12/22/2012  . Obstructive sleep apnea 12/22/2012    Orientation RESPIRATION BLADDER Height & Weight    Self, Time, Situation, Place  O2 Continent 6\' 1"  (185.4 cm) 325 lbs.  BEHAVIORAL SYMPTOMS/MOOD NEUROLOGICAL BOWEL NUTRITION STATUS  Other (Comment) (No Behaviors)   Continent Diet  AMBULATORY STATUS COMMUNICATION OF NEEDS Skin   Extensive Assist Verbally Surgical wounds                       Personal Care Assistance Level of Assistance  Bathing, Feeding, Dressing Bathing Assistance: Limited assistance Feeding assistance: Independent Dressing Assistance: Limited assistance     Functional Limitations  Info  Sight, Hearing, Speech Sight Info: Adequate Hearing Info: Adequate Speech Info: Adequate    SPECIAL CARE FACTORS FREQUENCY  PT (By licensed PT), OT (By licensed OT)     PT Frequency: 5 x wk OT Frequency: 5 x wk            Contractures Contractures Info: Not present    Additional Factors Info  Code Status Code Status Info: FULL CODE             Current Medications (06/03/2015):  This is the current hospital active medication list Current Facility-Administered Medications  Medication Dose Route Frequency Provider Last Rate Last Dose  . 0.9 % NaCl with KCl 20 mEq/ L  infusion   Intravenous Continuous Arlee Muslim, PA-C 10 mL/hr at 05/31/15 1759 10 mL/hr at 05/31/15 1759  . acetaminophen (TYLENOL) tablet 650 mg  650 mg Oral Q6H PRN Gaynelle Arabian, MD       Or  . acetaminophen (TYLENOL) suppository 650 mg  650 mg Rectal Q6H PRN Gaynelle Arabian, MD      . antiseptic oral rinse (CPC / CETYLPYRIDINIUM CHLORIDE 0.05%) solution 7 mL  7 mL Mouth Rinse q12n4p Gaynelle Arabian, MD   7 mL at 06/03/15 1259  . apixaban (ELIQUIS) tablet 2.5 mg  2.5 mg Oral Q12H Gaynelle Arabian, MD   2.5 mg at 06/03/15 0835  . atorvastatin (LIPITOR) tablet 20 mg  20 mg Oral QHS Gaynelle Arabian, MD   20 mg at 06/02/15 2236  . bisacodyl (DULCOLAX) suppository 10 mg  10 mg Rectal Daily PRN Gaynelle Arabian,  MD      . carvedilol (COREG) tablet 12.5 mg  12.5 mg Oral BID WC Theodis Blaze, MD   12.5 mg at 06/03/15 0836  . chlorhexidine (PERIDEX) 0.12 % solution 15 mL  15 mL Mouth Rinse BID Gaynelle Arabian, MD   15 mL at 06/03/15 0913  . diazepam (VALIUM) tablet 5 mg  5 mg Oral BID Gaynelle Arabian, MD   5 mg at 06/03/15 0916  . diltiazem (CARDIZEM CD) 24 hr capsule 360 mg  360 mg Oral Daily Gaynelle Arabian, MD   360 mg at 06/03/15 0913  . diphenhydrAMINE (BENADRYL) 12.5 MG/5ML elixir 12.5-25 mg  12.5-25 mg Oral Q4H PRN Gaynelle Arabian, MD      . docusate sodium (COLACE) capsule 100 mg  100 mg Oral BID Gaynelle Arabian, MD   100 mg at  06/03/15 0916  . [START ON 06/04/2015] furosemide (LASIX) injection 40 mg  40 mg Intravenous Daily Theodis Blaze, MD      . gabapentin (NEURONTIN) capsule 300 mg  300 mg Oral BID Gaynelle Arabian, MD   300 mg at 06/03/15 0913  . gi cocktail (Maalox,Lidocaine,Donnatal)  30 mL Oral TID PRN Theodis Blaze, MD   30 mL at 06/02/15 1052  . guaiFENesin-dextromethorphan (ROBITUSSIN DM) 100-10 MG/5ML syrup 5 mL  5 mL Oral Q4H PRN Theodis Blaze, MD   5 mL at 06/02/15 1145  . hydrALAZINE (APRESOLINE) injection 10 mg  10 mg Intravenous Q4H PRN Theodis Blaze, MD   10 mg at 06/03/15 0903  . hydrALAZINE (APRESOLINE) tablet 50 mg  50 mg Oral 3 times per day Caren Griffins, MD   50 mg at 06/03/15 1305  . lisinopril (PRINIVIL,ZESTRIL) tablet 20 mg  20 mg Oral Daily Royetta Asal, RPH   20 mg at 06/03/15 0915   And  . hydrochlorothiazide (MICROZIDE) capsule 12.5 mg  12.5 mg Oral Daily Royetta Asal, RPH   12.5 mg at 06/03/15 0915  . HYDROmorphone (DILAUDID) injection 1 mg  1 mg Intravenous Q2H PRN Theodis Blaze, MD   1 mg at 06/03/15 [id number]  . insulin aspart (novoLOG) injection 0-15 Units  0-15 Units Subcutaneous TID WC Gaynelle Arabian, MD   5 Units at 06/03/15 1254  . insulin detemir (LEVEMIR) injection 20 Units  20 Units Subcutaneous QHS Gaynelle Arabian, MD   20 Units at 06/02/15 2240  . levalbuterol (XOPENEX) nebulizer solution 1.25 mg  1.25 mg Nebulization Q4H PRN Theodis Blaze, MD      . levofloxacin [facility]) tablet 750 mg  750 mg Oral Daily Dara Hoyer, RPH   750 mg at 06/03/15 0913  . menthol-cetylpyridinium (CEPACOL) lozenge 3 mg  1 lozenge Oral PRN Gaynelle Arabian, MD       Or  . phenol (CHLORASEPTIC) mouth spray 1 spray  1 spray Mouth/Throat PRN Gaynelle Arabian, MD      . methocarbamol (ROBAXIN) tablet 500 mg  500 mg Oral Q6H PRN Gaynelle Arabian, MD   500 mg at 06/02/15 0836   Or  . methocarbamol (ROBAXIN) 500 mg in dextrose 5 % 50 mL IVPB  500 mg Intravenous Q6H PRN Gaynelle Arabian, MD   500 mg at 05/31/15 1423   . metoCLOPramide (REGLAN) tablet 5-10 mg  5-10 mg Oral Q8H PRN Gaynelle Arabian, MD       Or  . metoCLOPramide (REGLAN) injection 5-10 mg  5-10 mg Intravenous Q8H PRN Gaynelle Arabian, MD      . nitroGLYCERIN 50 mg  in dextrose 5 % 250 mL (0.2 mg/mL) infusion  0-200 mcg/min Intravenous Titrated Theodis Blaze, MD 12 mL/hr at 06/03/15 1301 40 mcg/min at 06/03/15 1301  . ondansetron (ZOFRAN) tablet 4 mg  4 mg Oral Q6H PRN Gaynelle Arabian, MD       Or  . ondansetron [facility]) injection 4 mg  4 mg Intravenous Q6H PRN Gaynelle Arabian, MD      . oxybutynin (DITROPAN) tablet 5 mg  5 mg Oral BID Gaynelle Arabian, MD   5 mg at 06/03/15 0915  . oxyCODONE (Oxy IR/ROXICODONE) immediate release tablet 5-10 mg  5-10 mg Oral Q3H PRN Gaynelle Arabian, MD   10 mg at 06/03/15 1254  . polyethylene glycol (MIRALAX / GLYCOLAX) packet 17 g  17 g Oral Daily PRN Gaynelle Arabian, MD   17 g at 06/03/15 0906  . sodium phosphate (FLEET) 7-19 GM/118ML enema 1 enema  1 enema Rectal Once PRN Gaynelle Arabian, MD      . traMADol Veatrice Bourbon) tablet 50-100 mg  50-100 mg Oral Q6H PRN Gaynelle Arabian, MD         Discharge Medications: Please see discharge summary for a list of discharge medications.  Relevant Imaging Results:  Relevant Lab Results:   Additional Information SS # [id number]  Analayah Brooke, Randall An, LCSW

## 2015-06-03 NOTE — Progress Notes (Signed)
OT Cancellation Note  Patient Details Name: Mario Mills MRN: [id number] DOB: 04-29-1951   Cancelled Treatment:    Reason Eval/Treat Not Completed: OT screened, no needs identified, will sign off. Plan is for patient to discharge to SNF. No acute OT needs identified, all needs can be met in SNF. Please send text page to OT services if any questions, concerns, or with new orders: [phone] OR call office at [phone]. Thank you for the order.    Chrys Racer , MS, OTR/L, CLT Pager: [phone]  06/03/2015, 11:46 AM

## 2015-06-03 NOTE — Progress Notes (Signed)
Subjective: 3 Days Post-Op Procedure(s) (LRB): TOTAL LEFT  KNEE ARTHROPLASTY (Left) Patient reports pain as mild.   Patient seen in rounds by Dr. Wynelle Link. Patient is well, but has had some minor complaints of pain in the knee, requiring pain medications Having issues with blood pressure.  Greatly appreciate Medicine help with this patient.  Objective: Vital signs in last 24 hours: Temp:  [97.5 F (36.4 C)-100.1 F (37.8 C)] 97.5 F (36.4 C) (01/16 0800) Pulse Rate:  [69-108] 74 (01/16 0836) Resp:  [15-32] 17 (01/16 0800) BP: (135-194)/(42-82) 162/57 mmHg (01/16 0915) SpO2:  [91 %-99 %] 98 % (01/16 0800) Weight:  [143.5 kg (316 lb 5.8 oz)] 143.5 kg (316 lb 5.8 oz) (01/16 0836)  Intake/Output from previous day:  Intake/Output Summary (Last 24 hours) at 06/03/15 0924 Last data filed at 06/03/15 0921  Gross per 24 hour  Intake 1479.58 ml  Output   4520 ml  Net -3040.42 ml    Intake/Output this shift: Total I/O In: 168 [P.O.:120; I.V.:48] Out: 325 [Urine:325]  Labs:  Recent Labs  05/31/15 1710 06/01/15 0339 06/02/15 0405 06/03/15 0340  HGB 12.6* 11.1* 9.8* 9.4*    Recent Labs  06/02/15 0405 06/03/15 0340  WBC 15.6* 14.5*  RBC 3.40* 3.23*  HCT 31.1* 29.9*  PLT 288 275    Recent Labs  06/02/15 0405 06/03/15 0340  NA 137 139  K 3.7 3.5  CL 102 99*  CO2 27 30  BUN 20 20  CREATININE 1.01 0.98  GLUCOSE 182* 171*  CALCIUM 8.2* 8.4*    Recent Labs  05/31/15 1710  INR 1.05    EXAM General - Patient is Alert and Appropriate Extremity - Neurovascular intact Sensation intact distally Dressing/Incision - clean Motor Function - intact, moving foot and toes well on exam.   Past Medical History  Diagnosis Date  . Hypertension   . Diabetes mellitus   . Hyperlipidemia   . Obstructive sleep apnea     CPAP  . Peripheral arterial disease ([place])   . Venous insufficiency   . Venous stasis ulcer ([place])   . DOE (dyspnea on exertion)     WITH MINIMAL  EXERTION  . HA (headache)     WAKE UP FREQUENTLY WITH HA  . History of Doppler ultrasound 12/2012    LE VENOUS REFLUX demostrated on L (vein stripping on R w/MRSA); h/o venous stasis bilat   . Atrial fibrillation ([place])   . Heart murmur   . Dysrhythmia   . History of urinary tract infection   . Urinary incontinence   . Arthritis   . Numbness and tingling     feet bilat   . Falls     Assessment/Plan: 3 Days Post-Op Procedure(s) (LRB): TOTAL LEFT  KNEE ARTHROPLASTY (Left) Principal Problem:   Acute respiratory failure with hypoxia (HCC) Active Problems:   Essential hypertension   Obstructive sleep apnea   Venous stasis ulcers of both lower extremities   Hypertensive urgency   S/P left knee arthroscopy   DM (diabetes mellitus) type II controlled peripheral vascular disorder (HCC)   Morbid obesity due to excess calories (HCC)   Bradycardia with 41 - 50 beats per minute   Sepsis, unspecified organism ([place])   Pulmonary vascular congestion   Postoperative anemia due to acute blood loss  Estimated body mass index is 41.75 kg/(m^2) as calculated from the following:   Height as of this encounter: 6\' 1"  (1.854 m).   Weight as of this encounter: 143.5 kg (316  lb 5.8 oz). Up with therapy  DVT Prophylaxis - Eliquis Weight-Bearing as tolerated to left leg  Arlee Muslim, PA-C Orthopaedic Surgery 06/03/2015, 9:24 AM

## 2015-06-04 DIAGNOSIS — I872 Venous insufficiency (chronic) (peripheral): Secondary | ICD-10-CM

## 2015-06-04 LAB — GLUCOSE, CAPILLARY
GLUCOSE-CAPILLARY: 126 mg/dL — AB (ref 65–99)
GLUCOSE-CAPILLARY: 193 mg/dL — AB (ref 65–99)
Glucose-Capillary: 227 mg/dL — ABNORMAL HIGH (ref 65–99)
Glucose-Capillary: 264 mg/dL — ABNORMAL HIGH (ref 65–99)

## 2015-06-04 LAB — BASIC METABOLIC PANEL
Anion gap: 11 (ref 5–15)
BUN: 25 mg/dL — AB (ref 6–20)
CHLORIDE: 99 mmol/L — AB (ref 101–111)
CO2: 29 mmol/L (ref 22–32)
CREATININE: 1.08 mg/dL (ref 0.61–1.24)
Calcium: 8.4 mg/dL — ABNORMAL LOW (ref 8.9–10.3)
GFR calc Af Amer: >60 mL/min (ref >60)
GFR calc non Af Amer: >60 mL/min (ref >60)
GLUCOSE: 152 mg/dL — AB (ref 65–99)
POTASSIUM: 3.7 mmol/L (ref 3.5–5.1)
Sodium: 139 mmol/L (ref 135–145)

## 2015-06-04 LAB — CBC
HCT: 30.5 % — ABNORMAL LOW (ref 39.0–52.0)
HEMOGLOBIN: 9.5 g/dL — AB (ref 13.0–17.0)
MCH: 28.9 pg (ref 26.0–34.0)
MCHC: 31.1 g/dL (ref 30.0–36.0)
MCV: 92.7 fL (ref 78.0–100.0)
Platelets: 305 10*3/uL (ref 150–400)
RBC: 3.29 MIL/uL — AB (ref 4.22–5.81)
RDW: 15.3 % (ref 11.5–15.5)
WBC: 12.6 10*3/uL — ABNORMAL HIGH (ref 4.0–10.5)

## 2015-06-04 NOTE — Progress Notes (Signed)
Pt Profile:  65 y.o. year old male with a history of HTN, DM, HL, OSA on CPAP, PAD, afib, was admitted 01/10 for L TKA, post-op resp failure req BiPAP, PNA, HTN. Has fallen several times. Orthostatic presyncope and falls (pt has does not remember hx af a fib and I cannot find in outpt records.)   Subjective: No chest pain and no SOB.  No complaints  Objective: Vital signs in last 24 hours: Temp:  [98.3 F (36.8 C)-100.6 F (38.1 C)] 98.3 F (36.8 C) (01/17 0802) Pulse Rate:  [70-81] 71 (01/17 0812) Resp:  [14-28] 14 (01/17 0700) BP: (125-162)/(45-67) 148/55 mmHg (01/17 0812) SpO2:  [90 %-98 %] 95 % (01/17 0700) Weight:  [316 lb 5.8 oz (143.5 kg)-319 lb 3.6 oz (144.8 kg)] 319 lb 3.6 oz (144.8 kg) (01/17 0700) Weight change:  Last BM Date: 05/31/15 Intake/Output from previous day: 01/16 0701 - 01/17 0700 In: 2100 [P.O.:1200; I.V.:900] Out: 1990 [Urine:1990] Intake/Output this shift:    PE: General:Pleasant affect, NAD Skin:Warm and dry, brisk capillary refill HEENT:normocephalic, sclera clear, mucus membranes moist Neck:supple, no JVD, no bruits  Heart:S1S2 RRR with 2/6 systolic murmur, no gallup, rub or click Lungs:clear without rales, rhonchi, or wheezes [id number], soft, non tender, + BS, do not palpate liver spleen or masses Ext:no to tr lower ext edema,  2+ radial pulses Neuro:alert and oriented X 3, MAE, follows commands, + facial symmetry Tele: SR episode of WC Rhythm-  Now with 2-3 beats of ventricular rhythm   Lab Results:  Recent Labs  06/03/15 0340 06/04/15 0340  WBC 14.5* 12.6*  HGB 9.4* 9.5*  HCT 29.9* 30.5*  PLT 275 305   BMET  Recent Labs  06/03/15 0340 06/04/15 0340  NA 139 139  K 3.5 3.7  CL 99* 99*  CO2 30 29  GLUCOSE 171* 152*  BUN 20 25*  CREATININE 0.98 1.08  CALCIUM 8.4* 8.4*   No results for input(s): TROPONINI in the last 72 hours.  Invalid input(s): CK, MB  No results found for: CHOL, HDL, LDLCALC, LDLDIRECT, TRIG,  CHOLHDL Lab Results  Component Value Date   HGBA1C 6.8* 05/24/2015     Studies/Results: Ct Angio Chest Pe W/cm &/or Wo Cm  06/02/2015  CLINICAL DATA:  Patient admitted for total knee arthroplasty. Postop hypoxia and shortness of breath. Evaluate for pulmonary embolism. EXAM: CT ANGIOGRAPHY CHEST WITH CONTRAST TECHNIQUE: Multidetector CT imaging of the chest was performed using the standard protocol during bolus administration of intravenous contrast. Multiplanar CT image reconstructions and MIPs were obtained to evaluate the vascular anatomy. CONTRAST:  100 mL Omnipaque 300 IV. COMPARISON:  Chest x-ray 05/31/2015 and abdominal CT 05/08/2011 FINDINGS: The lungs are well inflated with subtle hazy density adjacent the major fissure over the posterior right upper lobe likely dependent atelectasis. There has been resolution of the bilateral central airspace process seen on the recent chest x-ray likely resolved interstitial edema. There is a small amount of aspirate material along the right lateral wall lead tip of the distal trachea just above the carina. There is mild cardiomegaly. There is minimal calcified plaque over the thoracic aorta. There is no evidence of pulmonary embolism. No evidence of mediastinal or hilar adenopathy. No significant axillary adenopathy. Images through the upper abdomen demonstrate possible sub cm cyst over the upper pole right kidney unchanged. There mild degenerate changes of the spine. Review of the MIP images confirms the above findings. IMPRESSION: No evidence of pulmonary embolism. Interval clearing  of the previously noted bilateral central airspace process on recent chest radiograph likely resolved edema. Minimal residual hazy density over the posterior right upper lobe likely atelectasis. Small amount of aspirate material along the right lateral wall of the distal trachea just above the carina. Mild cardiomegaly. Electronically Signed   By: Marin Olp M.D.   On:  06/02/2015 15:45   Echo: 05/15/2015 - Left ventricle: The cavity size was normal. There was moderate concentric hypertrophy. Systolic function was normal. The estimated ejection fraction was in the range of 50% to 55%. Wall motion was normal; there were no regional wall motion abnormalities. - Aortic valve: There was mild stenosis. - Left atrium: The atrium was severely dilated. - Right ventricle: The cavity size was normal. Wall thickness was normal. Systolic function was normal.  Lexiscan Myoview 05/16/15  Nuclear stress EF: 47%.  The left ventricular ejection fraction is mildly decreased (45-54%).  There was no ST segment deviation noted during stress.  Defect 1: There is a small defect of mild severity present in the basal inferolateral and mid inferolateral location.  Findings consistent with ischemia.  This is an intermediate risk study.  There is a small area mild intensity reversible perfusion defect in the LCX territory (SDS = 4). LVEF appears better than calculated, consider echocardiogram for further evaluation.   Medications: I have reviewed the patient's current medications. Scheduled Meds: . antiseptic oral rinse  7 mL Mouth Rinse q12n4p  . apixaban  2.5 mg Oral Q12H  . atorvastatin  20 mg Oral QHS  . carvedilol  12.5 mg Oral BID WC  . chlorhexidine  15 mL Mouth Rinse BID  . diazepam  5 mg Oral BID  . diltiazem  360 mg Oral Daily  . docusate sodium  100 mg Oral BID  . furosemide  40 mg Intravenous Daily  . gabapentin  300 mg Oral BID  . lisinopril  20 mg Oral Daily   And  . hydrochlorothiazide  12.5 mg Oral Daily  . insulin aspart  0-15 Units Subcutaneous TID WC  . insulin detemir  20 Units Subcutaneous QHS  . isosorbide-hydrALAZINE  2 tablet Oral BID  . levofloxacin  750 mg Oral Daily  . oxybutynin  5 mg Oral BID   Continuous Infusions: . 0.9 % NaCl with KCl 20 mEq / L 10 mL/hr (05/31/15 1759)   PRN Meds:.acetaminophen **OR**  acetaminophen, bisacodyl, diphenhydrAMINE, gi cocktail, guaiFENesin-dextromethorphan, hydrALAZINE, HYDROmorphone (DILAUDID) injection, levalbuterol, menthol-cetylpyridinium **OR** phenol, methocarbamol **OR** methocarbamol (ROBAXIN)  IV, metoCLOPramide **OR** metoCLOPramide (REGLAN) injection, ondansetron **OR** ondansetron (ZOFRAN) IV, oxyCODONE, polyethylene glycol, sodium phosphate, traMADol  Assessment/Plan: Principal Problem:   Acute respiratory failure with hypoxia (HCC) Active Problems:   Essential hypertension   Obstructive sleep apnea   Venous stasis ulcers of both lower extremities   Hypertensive urgency   S/P left knee arthroscopy   DM (diabetes mellitus) type II controlled peripheral vascular disorder ([place])   Morbid obesity due to excess calories (HCC)   Bradycardia with 41 - 50 beats per minute   Sepsis, unspecified organism [facility])   Pulmonary vascular congestion   Postoperative anemia due to acute blood loss  Acute respiratory failure with hypoxia (HCC) - on room air this morning, denies dyspnea - appears to be secondary to bilateral PNA, per CXR.  - CT angio done 1/15 without evidence of PE and improvement of airspace processes - pt is on Levaquin, today is day #5 of ABX, fever curve improving - continue Lasix  Hypertensive urgency - pt  on Coreg 12.5 mg BID, Cardizem 360 mg QD, lasix 40 mg PRN, lisinopril - HCTZ at home  - Coreg was held post op due to bradycardia but was resumed 1/15 - lisinopril - HCTZ was held pre op, was resumed 1/15 - also added Hydralazine 1/15, increase to 50 TID today. BP improved off IV NTG with systolics in the 0000000 to [id number], HR 70s. .  Pulmonary vascular congestion - last 2 D ECHO 04/2015 with normal EF - more vascular congestion on exam today, will change lasix to IV and monitor clinical response - weight 322 on admission,now 319. strict I/O (-2618 since admit) +110 yesterday - once more euvolemic, can change back to PO lasix     Bradycardia with 41 - 50 beats per minute - resolved now in the 70s -ventricular rhythm still with 2-3 beats occ.  OSA, OHS - CPAP at night time   S/P left knee arthroscopy, post op day #4 - per ortho team  - Eliquis for DVT prophylaxis    LOS: 4 days   Time spent with pt. :15 minutes. [facility] R  Nurse Practitioner Certified Pager [phone] or after 5pm and on weekends call [phone] 06/04/2015, 8:18 AM   History and all data above reviewed.  Patient examined.  I agree with the findings as above.  No chest pain.  No SOB.  The patient exam reveals COR:RRR  ,  Lungs: Clear  ,  Abd: Positive bowel sounds, no rebound no guarding, Ext Mild edema  .  All available labs, radiology testing, previous records reviewed. Agree with documented assessment and plan. Changed to Bidil yesterday.  Off of IV NTG.  Possibly will titrate Bidil again tomorrow.  Will give IV Lasix today.   Jeneen Rinks Lucillia Corson  11:30 AM  06/04/2015

## 2015-06-04 NOTE — Progress Notes (Signed)
PROGRESS NOTE  Mario Mills [id number] DOB: [birth year] DOA: Jun 19, 2015 PCP: Philis Fendt, MD  HPI 65 y.o. year old male with end stage OA of his left knee with progressively worsening pain and dysfunction, admitted for left Total Knee Arthroplasty. Post op hypoxic and placed on BiPAP, TRH consulted to evaluate post op hypoxia.   Interval events Jun 18, 2022 - post op hypoxia, ? PNA, pt tx to SDU, placed on BIPAP, started on Vanc and Maxipime for PNA, placed on Nitro drip for HTN-ive urgency  1/15 - improving but with persistently high SBP in 180's, still on Nitro drip, transitioned to oral Levaquin  1/16 - Tmax 100.1 F, worse pain in the left knee, WBC up 13.9 --> 15.6, Hg 11 -->9.8, SBP iin 180's on nitro drip  1/17 - able to discontinue nitroglycerin drip the night before  Subjective: - Patient seen this morning, CPAP mask on, he is comfortable, denies any shortness of breath, denies any chest pain   Assessment / Plan Acute respiratory failure with hypoxia ([place]) - on room air this morning, denies dyspnea - appears to be secondary to bilateral PNA, per CXR.  - CT angio done 1/15 without evidence of PE and improvement of airspace processes - pt is on Levaquin, today is day #5 of ABX - Still with a fever last night of 100.6, continue to closely monitor her fever curve, I wonder whether there is a component of. Fever as well - continue Lasix  Hypertensive urgency - pt on Coreg 12.5 mg BID, Cardizem 360 mg QD, lasix 40 mg PRN, lisinopril - HCTZ at home  - Coreg was held post op due to bradycardia but was resumed 1/15 - lisinopril - HCTZ was held pre op, was resumed 1/15 - Started BIDIL 1/17 - Consulted cardiology 1/16 to assist, appreciate input - Was able to be weaned off nitro drip last night, his blood pressure this morning is 128/38. He is a symptomatic.  Pulmonary vascular congestion - last 2 D ECHO 04/2015 with normal EF - weight 322 on admission, monitor daily weights,  strict I/O, today's weight is 319 pounds - once more euvolemic, can change back to PO lasix   Acute post op blood loss anemia - monitor further Hg drop - no indication for transfusion at this time - CBC in AM  S/P left knee arthroscopy, post op day #2 - per ortho team  - Eliquis for DVT prophylaxis   Sepsis, unspecified organism ([place]) - post op 06/18/22, possibly related to bilateral PNA - started on vacn and maxipme post op and transitioned to oral Levaquin 1/14 - WBC improving some, still elevated, perhaps related to steroids  Bradycardia with 41 - 50 beats per minute - resolved, tolerating Coreg well   DM (diabetes mellitus) type II controlled peripheral vascular disorder ([place]) - continue home medical regimen with insulin - holding oral antihyperglycemic regimen for now until oral intake improves   Morbid obesity due to excess calories (HCC) - Body mass index is 44.8 kg/(m^2).  OSA, OHS - CPAP at night time   DVT prophylaxis - pt on Apixaban per ortho   Code Status: Full.  Family Communication:  plan of care discussed with the patient Disposition Plan: Home vs SNF 24-48 hours assuming he is afebrile and blood pressure is well controlled  Procedures and diagnostic studies:    Dg Chest Port 1 View 06-19-15 Bilateral patchy airspace disease and hazy interstitial prominence. Findings highly suspicious for bilateral pneumonia or bilateral pulmonary edema. No  pneumothorax.  CT chest 1/15 No evidence of pulmonary embolism. Interval clearing of the previously noted bilateral central airspace process on recent chest radiograph likely resolved edema. Minimal residual hazy density over the posterior right upper lobe likely atelectasis. Small amount of aspirate material along the right lateral wall of the distal trachea just above the carina.  Medical Consultants:  TRH  Other Consultants:  PT  IAnti-Infectives:   Vancomycin 01/13 --> 1/14 Maxipime 01/13 --> 1/14  Levaquin  1/14 >>   Objective: Filed Vitals:   06/04/15 0812 06/04/15 0900 06/04/15 0934 06/04/15 1000  BP: 148/55 133/45 133/45 128/38  Pulse: 71 69  66  Temp:      TempSrc:      Resp:    15  Height:      Weight:      SpO2:  96%  99%    Intake/Output Summary (Last 24 hours) at 06/04/15 1040 Last data filed at 06/04/15 1018  Gross per 24 hour  Intake   1814 ml  Output   1765 ml  Net     49 ml   Exam:   General:  Pt is alert, follows commands appropriately, not in acute distress  Cardiovascular: Regular rhythm, tachycardic, no rubs, no gallops  Respiratory: Crackles at bases with rhonchi, tachypnea   Abdomen: Soft, non tender, non distended, bowel sounds present, no guarding  Extremities: pulses DP and PT palpable bilaterally, + 1 bilateral LE edema with chronic venous stasis   Neuro: Grossly nonfocal  Data Reviewed: Basic Metabolic Panel:  Recent Labs Lab 05/31/15 1710 06/01/15 0339 06/02/15 0405 06/03/15 0340 06/04/15 0340  NA 140 140 137 139 139  K 3.7 3.6 3.7 3.5 3.7  CL 101 101 102 99* 99*  CO2 29 28 27 30 29   GLUCOSE 171* 170* 182* 171* 152*  BUN 16 19 20 20  25*  CREATININE 1.04 1.07 1.01 0.98 1.08  CALCIUM 8.7* 8.3* 8.2* 8.4* 8.4*   Liver Function Tests:  Recent Labs Lab 05/31/15 1710  AST 45*  ALT 68*  ALKPHOS 72  BILITOT 0.6  PROT 7.3  ALBUMIN 3.3*   CBC:  Recent Labs Lab 05/31/15 1710 06/01/15 0339 06/02/15 0405 06/03/15 0340 06/04/15 0340  WBC 16.4* 13.9* 15.6* 14.5* 12.6*  NEUTROABS 15.0*  --   --   --   --   HGB 12.6* 11.1* 9.8* 9.4* 9.5*  HCT 40.6 35.4* 31.1* 29.9* 30.5*  MCV 93.3 93.4 91.5 92.6 92.7  PLT 350 327 288 275 305   CBG:  Recent Labs Lab 06/03/15 0753 06/03/15 1224 06/03/15 1551 06/03/15 2148 06/04/15 0800  GLUCAP 129* 220* 219* 180* 126*    Recent Results (from the past 240 hour(s))  MRSA PCR Screening     Status: None   Collection Time: 05/31/15  4:10 PM  Result Value Ref Range Status   MRSA by PCR  NEGATIVE NEGATIVE Final    Comment:        The GeneXpert MRSA Assay (FDA approved for NASAL specimens only), is one component of a comprehensive MRSA colonization surveillance program. It is not intended to diagnose MRSA infection nor to guide or monitor treatment for MRSA infections.   Culture, blood (routine x 2) Call MD if unable to obtain prior to antibiotics being given     Status: None (Preliminary result)   Collection Time: 05/31/15  5:10 PM  Result Value Ref Range Status   Specimen Description RIGHT ANTECUBITAL  Final   Special Requests BOTTLES DRAWN AEROBIC  AND ANAEROBIC 10CC  Final   Culture   Final    NO GROWTH 3 DAYS Performed at [facility]    Report Status PENDING  Incomplete  Culture, blood (routine x 2) Call MD if unable to obtain prior to antibiotics being given     Status: None (Preliminary result)   Collection Time: 05/31/15  5:22 PM  Result Value Ref Range Status   Specimen Description BLOOD RIGHT ARM  Final   Special Requests BOTTLES DRAWN AEROBIC AND ANAEROBIC 10CC  Final   Culture   Final    NO GROWTH 3 DAYS Performed at [facility]    Report Status PENDING  Incomplete     Scheduled Meds: . antiseptic oral rinse  7 mL Mouth Rinse q12n4p  . apixaban  2.5 mg Oral Q12H  . atorvastatin  20 mg Oral QHS  . carvedilol  12.5 mg Oral BID WC  . chlorhexidine  15 mL Mouth Rinse BID  . diazepam  5 mg Oral BID  . diltiazem  360 mg Oral Daily  . docusate sodium  100 mg Oral BID  . furosemide  40 mg Intravenous Daily  . gabapentin  300 mg Oral BID  . lisinopril  20 mg Oral Daily   And  . hydrochlorothiazide  12.5 mg Oral Daily  . insulin aspart  0-15 Units Subcutaneous TID WC  . insulin detemir  20 Units Subcutaneous QHS  . isosorbide-hydrALAZINE  2 tablet Oral BID  . levofloxacin  750 mg Oral Daily  . oxybutynin  5 mg Oral BID   Continuous Infusions: . 0.9 % NaCl with KCl 20 mEq / L 10 mL/hr (05/31/15 1759)     Marzetta Board,  MD  Pager [phone]  If 7PM-7AM, please contact night-coverage [email or web address] Password [facility] 06/03/2015, 11:48 AM

## 2015-06-04 NOTE — Progress Notes (Signed)
Inpatient Diabetes Program Recommendations  AACE/[name]: New Consensus Statement on Inpatient Glycemic Control (2015)  Target Ranges:  Prepandial:   less than 140 mg/dL      Peak postprandial:   less than 180 mg/dL (1-2 hours)      Critically ill patients:  140 - 180 mg/dL   Results for GEVORG, UTRERA (MRN [id number]) as of 06/04/2015 09:05  Ref. Range 06/03/2015 07:53 06/03/2015 12:24 06/03/2015 15:51 06/03/2015 21:48  Glucose-Capillary Latest Ref Range: 65-99 mg/dL 129 (H) 220 (H) 219 (H) 180 (H)   Results for BARTEK, FANJOY (MRN [id number]) as of 06/04/2015 09:05  Ref. Range 06/04/2015 08:00  Glucose-Capillary Latest Ref Range: 65-99 mg/dL 126 (H)    Home DM Meds: Levemir 20 units QHS  Amaryl 4 mg daily  Metformin 1000 mg bid  Current Insulin Orders: Levemir 20 units QHS  Novolog Moderate SSI (0-15 units) TID AC     MD- Fasting glucose well controlled on current dose of Levemir. Patient having some elevated postprandial glucose levels.  Please consider starting low dose Novolog Meal Coverage while patient's home Metformin and Amaryl are on hold-   Novolog 4 units tid with meals     --Will follow patient during hospitalization--  Wyn Quaker RN, MSN, CDE Diabetes Coordinator Inpatient Glycemic Control Team Team Pager: [phone] (8a-5p)

## 2015-06-04 NOTE — Progress Notes (Signed)
Physical Therapy Treatment Patient Details Name: CAGNEY DEBORDE MRN: [id number] DOB: 1951-04-24 Today's Date: 06/04/2015    History of Present Illness Pt is a 65 year old male s/p L TKA with post op acute respiratory failure with hypoxia initially requiring BiPAP and hypertensive urgency (on nitro drip).  PMHx HTN, DM, PAD, OSA, obesity    PT Comments    POD # 4 L TKR post op hypoxia and HTN Seen in ICU.  Required + 2 assist for all mobility.  Max c/o dizziness with standing.  Limited amb distance.  Positioned in recliner then performed TKR TE's followed by ICE.   Follow Up Recommendations  SNF ([place])     Equipment Recommendations       Recommendations for Other Services       Precautions / Restrictions Precautions Precautions: Fall;Knee Precaution Comments: instructed on KI use for amb and monitor sats Required Braces or Orthoses: Knee Immobilizer - Left Knee Immobilizer - Left: Discontinue once straight leg raise with < 10 degree lag Restrictions Weight Bearing Restrictions: No Other Position/Activity Restrictions: WBAT    Mobility  Bed Mobility Overal bed mobility: Needs Assistance Bed Mobility: Supine to Sit       Sit to supine: Max assist   General bed mobility comments: increased time and assist for L LE and upper body due to BMI  Transfers Overall transfer level: Needs assistance Equipment used: Rolling walker (2 wheeled) Transfers: Sit to/from Stand Sit to Stand: Mod assist;+2 physical assistance;+2 safety/equipment         General transfer comment: forward momentum off elevated bed with severe L lean.  Required increased time to adjust to midline posture/balance.  Pt was gripping walker but not supporting self correctly.  VC's to increase WGing thru B UE's on walker and to shift weight to midline.  Poor standing balance.    Ambulation/Gait Ambulation/Gait assistance: Mod assist;+2 physical assistance Ambulation Distance (Feet): 3  Feet Assistive device: Rolling walker (2 wheeled) Gait Pattern/deviations: Step-to pattern     General Gait Details: decreased amb distance due to max c/o "dizziness".  Only tolerated 3 feet.  Very ynsteady.  recliner brought to pt from behind.    Stairs            Wheelchair Mobility    Modified Rankin (Stroke Patients Only)       Balance                                    Cognition Arousal/Alertness: Awake/alert Behavior During Therapy: WFL for tasks assessed/performed Overall Cognitive Status: Within Functional Limits for tasks assessed                      Exercises   Total Knee Replacement TE's 10 reps B LE ankle pumps 10 reps towel squeezes 10 reps knee presses 10 reps heel slides AAROM 10 reps SLR's AAROM 10 reps ABD AAROM Followed by ICE     General Comments        Pertinent Vitals/Pain Pain Assessment: 0-10 Pain Score: 7  Pain Location: L knee Pain Descriptors / Indicators: Sore;Tender;Grimacing Pain Intervention(s): Monitored during session;Premedicated before session;Repositioned;Ice applied    Home Living                      Prior Function            PT Goals (current goals can  now be found in the care plan section) Progress towards PT goals: Progressing toward goals    Frequency  7X/week    PT Plan Current plan remains appropriate    Co-evaluation             End of Session Equipment Utilized During Treatment: Gait belt;Left knee immobilizer Activity Tolerance: Treatment limited secondary to medical complications (Comment) (HTN)       Time: 1325-1350 PT Time Calculation (min) (ACUTE ONLY): 25 min  Charges:  $Gait Training: 8-22 mins $Therapeutic Exercise: 8-22 mins                    G Codes:      Rica Koyanagi  PTA [facility]  Acute  Rehab Pager      [phone]

## 2015-06-04 NOTE — Progress Notes (Signed)
Subjective: 4 Days Post-Op Procedure(s) (LRB): TOTAL LEFT  KNEE ARTHROPLASTY (Left) Patient reports pain as mild.   Patient seen in rounds with Dr. Wynelle Link. Patient is doing fine with regards to the knee but needs to be up walking some before going to SNF.  Patient states that he feels better overall and his blood pressure is better.  Up with more therapy today. Patient is well, and has had no acute complaints or problems with regards to the left knee. Plan is to go Skilled nursing facility after hospital stay.  Objective: Vital signs in last 24 hours: Temp:  [98.3 F (36.8 C)-100.6 F (38.1 C)] 98.3 F (36.8 C) (01/17 0802) Pulse Rate:  [70-81] 71 (01/17 0812) Resp:  [14-28] 14 (01/17 0700) BP: (125-162)/(45-67) 133/45 mmHg (01/17 0934) SpO2:  [90 %-98 %] 95 % (01/17 0700) Weight:  [144.8 kg (319 lb 3.6 oz)] 144.8 kg (319 lb 3.6 oz) (01/17 0700)  Intake/Output from previous day:  Intake/Output Summary (Last 24 hours) at 06/04/15 0946 Last data filed at 06/04/15 0545  Gross per 24 hour  Intake   1932 ml  Output   1665 ml  Net    267 ml    Labs:  Recent Labs  06/02/15 0405 06/03/15 0340 06/04/15 0340  HGB 9.8* 9.4* 9.5*    Recent Labs  06/03/15 0340 06/04/15 0340  WBC 14.5* 12.6*  RBC 3.23* 3.29*  HCT 29.9* 30.5*  PLT 275 305    Recent Labs  06/03/15 0340 06/04/15 0340  NA 139 139  K 3.5 3.7  CL 99* 99*  CO2 30 29  BUN 20 25*  CREATININE 0.98 1.08  GLUCOSE 171* 152*  CALCIUM 8.4* 8.4*   No results for input(s): LABPT, INR in the last 72 hours.  EXAM General - Patient is Alert, Appropriate and Oriented Extremity - Neurovascular intact Sensation intact distally Dressing/Incision - clean, dry, no drainage, healing Motor Function - intact, moving foot and toes well on exam.   Past Medical History  Diagnosis Date  . Hypertension   . Diabetes mellitus   . Hyperlipidemia   . Obstructive sleep apnea     CPAP  . Peripheral arterial disease ([place])    . Venous insufficiency   . Venous stasis ulcer ([place])   . DOE (dyspnea on exertion)     WITH MINIMAL EXERTION  . HA (headache)     WAKE UP FREQUENTLY WITH HA  . History of Doppler ultrasound 12/2012    LE VENOUS REFLUX demostrated on L (vein stripping on R w/MRSA); h/o venous stasis bilat   . Atrial fibrillation ([place])   . Heart murmur   . Dysrhythmia   . History of urinary tract infection   . Urinary incontinence   . Arthritis   . Numbness and tingling     feet bilat   . Falls     Assessment/Plan: 4 Days Post-Op Procedure(s) (LRB): TOTAL LEFT  KNEE ARTHROPLASTY (Left) Principal Problem:   Acute respiratory failure with hypoxia (HCC) Active Problems:   Essential hypertension   Obstructive sleep apnea   Venous stasis ulcers of both lower extremities   Hypertensive urgency   S/P left knee arthroscopy   DM (diabetes mellitus) type II controlled peripheral vascular disorder (HCC)   Morbid obesity due to excess calories (HCC)   Bradycardia with 41 - 50 beats per minute   Sepsis, unspecified organism [facility])   Pulmonary vascular congestion   Postoperative anemia due to acute blood loss  Estimated body  mass index is 42.13 kg/(m^2) as calculated from the following:   Height as of this encounter: 6\' 1"  (1.854 m).   Weight as of this encounter: 144.8 kg (319 lb 3.6 oz). Up with therapy  Maybe out of stepdown depending upon Medical Service.  DVT Prophylaxis - Eliquis Weight-Bearing as tolerated to left leg  Arlee Muslim, PA-C Orthopaedic Surgery 06/04/2015, 9:46 AM

## 2015-06-04 NOTE — Progress Notes (Signed)
RT placed patient on CPAP. Patient setting is auto 5-20 cmH2O. RT placed sterile water in water chamber for humidification. Patient is tolerating well. RT will continue to monitor.

## 2015-06-05 DIAGNOSIS — I10 Essential (primary) hypertension: Secondary | ICD-10-CM

## 2015-06-05 LAB — CULTURE, BLOOD (ROUTINE X 2)
Culture: NO GROWTH
Culture: NO GROWTH

## 2015-06-05 LAB — BASIC METABOLIC PANEL
Anion gap: 11 (ref 5–15)
BUN: 25 mg/dL — AB (ref 6–20)
CHLORIDE: 99 mmol/L — AB (ref 101–111)
CO2: 30 mmol/L (ref 22–32)
CREATININE: 1.17 mg/dL (ref 0.61–1.24)
Calcium: 8.6 mg/dL — ABNORMAL LOW (ref 8.9–10.3)
GFR calc Af Amer: >60 mL/min (ref >60)
GFR calc non Af Amer: >60 mL/min (ref >60)
Glucose, Bld: 149 mg/dL — ABNORMAL HIGH (ref 65–99)
POTASSIUM: 4.1 mmol/L (ref 3.5–5.1)
Sodium: 140 mmol/L (ref 135–145)

## 2015-06-05 LAB — CBC
HEMATOCRIT: 29.6 % — AB (ref 39.0–52.0)
Hemoglobin: 9.1 g/dL — ABNORMAL LOW (ref 13.0–17.0)
MCH: 28.8 pg (ref 26.0–34.0)
MCHC: 30.7 g/dL (ref 30.0–36.0)
MCV: 93.7 fL (ref 78.0–100.0)
PLATELETS: 352 10*3/uL (ref 150–400)
RBC: 3.16 MIL/uL — ABNORMAL LOW (ref 4.22–5.81)
RDW: 15.4 % (ref 11.5–15.5)
WBC: 13.3 10*3/uL — ABNORMAL HIGH (ref 4.0–10.5)

## 2015-06-05 LAB — GLUCOSE, CAPILLARY
Glucose-Capillary: 145 mg/dL — ABNORMAL HIGH (ref 65–99)
Glucose-Capillary: 178 mg/dL — ABNORMAL HIGH (ref 65–99)
Glucose-Capillary: 200 mg/dL — ABNORMAL HIGH (ref 65–99)
Glucose-Capillary: 214 mg/dL — ABNORMAL HIGH (ref 65–99)

## 2015-06-05 MED ORDER — PANTOPRAZOLE SODIUM 40 MG PO TBEC
40.0000 mg | DELAYED_RELEASE_TABLET | Freq: Every day | ORAL | Status: DC
  Administered 2015-06-05 – 2015-06-11 (×7): 40 mg via ORAL
  Filled 2015-06-05 (×8): qty 1

## 2015-06-05 MED ORDER — ISOSORB DINITRATE-HYDRALAZINE 20-37.5 MG PO TABS
2.0000 | ORAL_TABLET | Freq: Three times a day (TID) | ORAL | Status: DC
  Administered 2015-06-05 – 2015-06-11 (×18): 2 via ORAL
  Filled 2015-06-05 (×19): qty 2

## 2015-06-05 MED ORDER — VANCOMYCIN HCL 10 G IV SOLR
2500.0000 mg | INTRAVENOUS | Status: AC
  Administered 2015-06-05: 2500 mg via INTRAVENOUS
  Filled 2015-06-05: qty 500

## 2015-06-05 MED ORDER — METRONIDAZOLE IN NACL 5-0.79 MG/ML-% IV SOLN
500.0000 mg | Freq: Three times a day (TID) | INTRAVENOUS | Status: DC
  Administered 2015-06-05 – 2015-06-07 (×6): 500 mg via INTRAVENOUS
  Filled 2015-06-05 (×7): qty 100

## 2015-06-05 MED ORDER — VANCOMYCIN HCL 10 G IV SOLR
1250.0000 mg | Freq: Two times a day (BID) | INTRAVENOUS | Status: DC
  Administered 2015-06-06: 1250 mg via INTRAVENOUS
  Filled 2015-06-05: qty 1250

## 2015-06-05 NOTE — Progress Notes (Signed)
[facility] PROGRESS NOTE  AZENDE ALGER [id number] DOB: Jan 12, 1951 DOA: 05/31/2015 PCP: Philis Fendt, MD  Summary 06/05/15: I have seen and examined Mr. Mario Mills at bedside, reviewed his chart and discussed with orthopedics. Appreciate cardiology/orthopedics. 65 y.o. year old male with end stage OA of his left knee with progressively worsening pain and dysfunction, admitted for left Total Knee Arthroplasty. Post op hypoxic and placed on BiPAP, TRH consulted to evaluate post op hypoxia. He may have aspirated, staph aureus swab positive earlier in the admission. His white count is going up after transitioning to Levaquin. We'll therefore add back vancomycin and give Flagyl and reassess. He feels better. Plan Acute respiratory failure with hypoxia (HCC)/Sepsis, unspecified organism (HCC)/Pulmonary vascular congestion/Obstructive sleep apnea  Vancomycin/Flagyl  Continue Levaquin Essential hypertension/Hypertensive urgency/DM (diabetes mellitus) type II/controlled peripheral vascular disorderHCC)/Morbid obesity due to excess calories (HCC)/Bradycardia with 41 - 50 beats per minute  Appreciate cardiology  Follow cardiology recommendations  Continue current management for diabetes mellitus Venous stasis ulcers of both lower extremities/S/P left knee arthroscopy/Postoperative anemia due to acute blood loss   defer management to orthopedics   Code Status: Full Code Family Communication: Patient at bedside Disposition Plan: Per ortho   Consultants:  Cardiology  Orthopedics  Procedures:  Left total knee replacement  Antibiotics:  Levaquin  Vancomycin  Flagyl  HPI/Subjective: Feels better.  Objective: Filed Vitals:   06/05/15 0501 06/05/15 1410  BP: 143/51 158/64  Pulse:  71  Temp:  98 F (36.7 C)  Resp:  20    Intake/Output Summary (Last 24 hours) at 06/05/15 1849 Last data filed at 06/05/15 1410  Gross per 24 hour  Intake    608 ml  Output   2200 ml   Net  -1592 ml   Filed Weights   06/03/15 0836 06/04/15 0700 06/05/15 0449  Weight: 143.5 kg (316 lb 5.8 oz) 144.8 kg (319 lb 3.6 oz) 152.7 kg (336 lb 10.3 oz)    Exam:   General:  Comfortable at rest.  Cardiovascular: S1-S2 normal. No murmurs. Pulse regular.  Respiratory: Good air entry bilaterally. No rhonchi or rales.  Abdomen: Soft and nontender. Normal bowel sounds. No organomegaly.  Musculoskeletal: No pedal edema. No drainage left knee  Neurological: Intact  Data Reviewed: Basic Metabolic Panel:  Recent Labs Lab 06/01/15 0339 06/02/15 0405 06/03/15 0340 06/04/15 0340 06/05/15 0534  NA 140 137 139 139 140  K 3.6 3.7 3.5 3.7 4.1  CL 101 102 99* 99* 99*  CO2 28 27 30 29 30   GLUCOSE 170* 182* 171* 152* 149*  BUN 19 20 20  25* 25*  CREATININE 1.07 1.01 0.98 1.08 1.17  CALCIUM 8.3* 8.2* 8.4* 8.4* 8.6*   Liver Function Tests:  Recent Labs Lab 05/31/15 1710  AST 45*  ALT 68*  ALKPHOS 72  BILITOT 0.6  PROT 7.3  ALBUMIN 3.3*   No results for input(s): LIPASE, AMYLASE in the last 168 hours. No results for input(s): AMMONIA in the last 168 hours. CBC:  Recent Labs Lab 05/31/15 1710 06/01/15 0339 06/02/15 0405 06/03/15 0340 06/04/15 0340 06/05/15 0534  WBC 16.4* 13.9* 15.6* 14.5* 12.6* 13.3*  NEUTROABS 15.0*  --   --   --   --   --   HGB 12.6* 11.1* 9.8* 9.4* 9.5* 9.1*  HCT 40.6 35.4* 31.1* 29.9* 30.5* 29.6*  MCV 93.3 93.4 91.5 92.6 92.7 93.7  PLT 350 327 288 275 305 352   Cardiac Enzymes: No results for input(s): CKTOTAL, CKMB, CKMBINDEX, TROPONINI in  the last 168 hours. BNP (last 3 results) No results for input(s): BNP in the last 8760 hours.  ProBNP (last 3 results) No results for input(s): PROBNP in the last 8760 hours.  CBG:  Recent Labs Lab 06/04/15 1139 06/04/15 1639 06/04/15 2158 06/05/15 0722 06/05/15 1226  GLUCAP 193* 227* 264* 145* 178*    Recent Results (from the past 240 hour(s))  MRSA PCR Screening     Status: None    Collection Time: 05/31/15  4:10 PM  Result Value Ref Range Status   MRSA by PCR NEGATIVE NEGATIVE Final    Comment:        The GeneXpert MRSA Assay (FDA approved for NASAL specimens only), is one component of a comprehensive MRSA colonization surveillance program. It is not intended to diagnose MRSA infection nor to guide or monitor treatment for MRSA infections.   Culture, blood (routine x 2) Call MD if unable to obtain prior to antibiotics being given     Status: None   Collection Time: 05/31/15  5:10 PM  Result Value Ref Range Status   Specimen Description RIGHT ANTECUBITAL  Final   Special Requests BOTTLES DRAWN AEROBIC AND ANAEROBIC 10CC  Final   Culture   Final    NO GROWTH 5 DAYS Performed at [facility]    Report Status 06/05/2015 FINAL  Final  Culture, blood (routine x 2) Call MD if unable to obtain prior to antibiotics being given     Status: None   Collection Time: 05/31/15  5:22 PM  Result Value Ref Range Status   Specimen Description BLOOD RIGHT ARM  Final   Special Requests BOTTLES DRAWN AEROBIC AND ANAEROBIC 10CC  Final   Culture   Final    NO GROWTH 5 DAYS Performed at [facility]    Report Status 06/05/2015 FINAL  Final     Studies: No results found.  Scheduled Meds: . antiseptic oral rinse  7 mL Mouth Rinse q12n4p  . apixaban  2.5 mg Oral Q12H  . atorvastatin  20 mg Oral QHS  . carvedilol  12.5 mg Oral BID WC  . chlorhexidine  15 mL Mouth Rinse BID  . diazepam  5 mg Oral BID  . diltiazem  360 mg Oral Daily  . docusate sodium  100 mg Oral BID  . furosemide  40 mg Intravenous Daily  . gabapentin  300 mg Oral BID  . lisinopril  20 mg Oral Daily   And  . hydrochlorothiazide  12.5 mg Oral Daily  . insulin aspart  0-15 Units Subcutaneous TID WC  . insulin detemir  20 Units Subcutaneous QHS  . isosorbide-hydrALAZINE  2 tablet Oral TID  . levofloxacin  750 mg Oral Daily  . metronidazole  500 mg Intravenous Q8H  . oxybutynin  5 mg  Oral BID  . pantoprazole  40 mg Oral Q1200  . [START ON 06/06/2015] vancomycin  1,250 mg Intravenous Q12H  . vancomycin  2,500 mg Intravenous STAT   Continuous Infusions: . 0.9 % NaCl with KCl 20 mEq / L 50 mL/hr at 06/05/15 1722     Time spent: 25 minutes    Venia Riveron  [facility] Pager [phone]. If 7PM-7AM, please contact night-coverage at [email or web address], password [facility] 06/05/2015, 6:49 PM  LOS: 5 days

## 2015-06-05 NOTE — Progress Notes (Signed)
RT placed patient on CPAP. Patient setting is 5-20 cmH2O. 2 liters oxygen bleed into tubing. Sterile water added to water chamber for humidification. Patient is tolerating well. RT will continue to monitor.

## 2015-06-05 NOTE — Care Management Important Message (Signed)
Important Message  Patient Details  Name: ROMMELL MCCUMBERS MRN: [id number] Date of Birth: 1950-10-03   Medicare Important Message Given:  Yes    [name] 06/05/2015, 10:37 AMImportant Message  Patient Details  Name: ATTIKUS ROSENDALE MRN: [id number] Date of Birth: Nov 08, 1950   Medicare Important Message Given:  Yes    [name] 06/05/2015, 10:37 AM

## 2015-06-05 NOTE — Progress Notes (Signed)
ANTIBIOTIC CONSULT NOTE - INITIAL  Pharmacy Consult for Vancomycin Indication: pneumonia  No Known Allergies  Patient Measurements: Height: 6\' 1"  (185.4 cm) Weight: (!) 336 lb 10.3 oz (152.7 kg) IBW/kg (Calculated) : 79.9  Vital Signs: Temp: 98 F (36.7 C) (01/18 1410) Temp Source: Oral (01/18 1410) BP: 158/64 mmHg (01/18 1410) Pulse Rate: 71 (01/18 1410) Intake/Output from previous day: 01/17 0701 - 01/18 0700 In: 1198 [P.O.:960; I.V.:238] Out: 2450 [Urine:2450] Intake/Output from this shift: Total I/O In: -  Out: 1050 [Urine:1050]  Labs:  Recent Labs  06/03/15 0340 06/04/15 0340 06/05/15 0534  WBC 14.5* 12.6* 13.3*  HGB 9.4* 9.5* 9.1*  PLT 275 305 352  CREATININE 0.98 1.08 1.17   Estimated Creatinine Clearance: 95.8 mL/min (by C-G formula based on Cr of 1.17). No results for input(s): VANCOTROUGH, VANCOPEAK, VANCORANDOM, GENTTROUGH, GENTPEAK, GENTRANDOM, TOBRATROUGH, TOBRAPEAK, TOBRARND, AMIKACINPEAK, AMIKACINTROU, AMIKACIN in the last 72 hours.   Microbiology: Recent Results (from the past 720 hour(s))  Surgical pcr screen     Status: Abnormal   Collection Time: 05/24/15  4:34 PM  Result Value Ref Range Status   MRSA, PCR NEGATIVE NEGATIVE Final   Staphylococcus aureus POSITIVE (A) NEGATIVE Final    Comment:        The Xpert SA Assay (FDA approved for NASAL specimens in patients over 53 years of age), is one component of a comprehensive surveillance program.  Test performance has been validated by [facility] for patients greater than or equal to 25 year old. It is not intended to diagnose infection nor to guide or monitor treatment.   MRSA PCR Screening     Status: None   Collection Time: 05/31/15  4:10 PM  Result Value Ref Range Status   MRSA by PCR NEGATIVE NEGATIVE Final    Comment:        The GeneXpert MRSA Assay (FDA approved for NASAL specimens only), is one component of a comprehensive MRSA colonization surveillance program. It is  not intended to diagnose MRSA infection nor to guide or monitor treatment for MRSA infections.   Culture, blood (routine x 2) Call MD if unable to obtain prior to antibiotics being given     Status: None   Collection Time: 05/31/15  5:10 PM  Result Value Ref Range Status   Specimen Description RIGHT ANTECUBITAL  Final   Special Requests BOTTLES DRAWN AEROBIC AND ANAEROBIC 10CC  Final   Culture   Final    NO GROWTH 5 DAYS Performed at [facility]    Report Status 06/05/2015 FINAL  Final  Culture, blood (routine x 2) Call MD if unable to obtain prior to antibiotics being given     Status: None   Collection Time: 05/31/15  5:22 PM  Result Value Ref Range Status   Specimen Description BLOOD RIGHT ARM  Final   Special Requests BOTTLES DRAWN AEROBIC AND ANAEROBIC 10CC  Final   Culture   Final    NO GROWTH 5 DAYS Performed at [facility]    Report Status 06/05/2015 FINAL  Final    Medical History: Past Medical History  Diagnosis Date  . Hypertension   . Diabetes mellitus   . Hyperlipidemia   . Obstructive sleep apnea     CPAP  . Peripheral arterial disease ([place])   . Venous insufficiency   . Venous stasis ulcer ([place])   . DOE (dyspnea on exertion)     WITH MINIMAL EXERTION  . HA (headache)     WAKE  UP FREQUENTLY WITH HA  . History of Doppler ultrasound 12/2012    LE VENOUS REFLUX demostrated on L (vein stripping on R w/MRSA); h/o venous stasis bilat   . Atrial fibrillation ([place])   . Heart murmur   . Dysrhythmia   . History of urinary tract infection   . Urinary incontinence   . Arthritis   . Numbness and tingling     feet bilat   . Falls     Medications:  Anti-infectives    Start     Dose/Rate Route Frequency Ordered Stop   06/05/15 1600  metroNIDAZOLE (FLAGYL) IVPB 500 mg     500 mg 100 mL/hr over 60 Minutes Intravenous Every 8 hours 06/05/15 1529     06/02/15 1000  levofloxacin (LEVAQUIN) tablet 750 mg     750 mg Oral Daily 06/01/15 1326      06/01/15 1300  levofloxacin (LEVAQUIN) tablet 500 mg  Status:  Discontinued     500 mg Oral Daily 06/01/15 1245 06/01/15 1326   06/01/15 0600  vancomycin (VANCOCIN) 1,250 mg in sodium chloride 0.9 % 250 mL IVPB  Status:  Discontinued     1,250 mg 166.7 mL/hr over 90 Minutes Intravenous Every 12 hours 05/31/15 1604 06/01/15 1245   05/31/15 1700  vancomycin (VANCOCIN) 2,500 mg in sodium chloride 0.9 % 500 mL IVPB     2,500 mg 250 mL/hr over 120 Minutes Intravenous  Once 05/31/15 1604 05/31/15 2005   05/31/15 1600  ceFAZolin (ANCEF) IVPB 2 g/50 mL premix  Status:  Discontinued     2 g 100 mL/hr over 30 Minutes Intravenous Every 6 hours 05/31/15 1526 05/31/15 1600   05/31/15 1600  ceFEPIme (MAXIPIME) 2 g in dextrose 5 % 50 mL IVPB  Status:  Discontinued     2 g 100 mL/hr over 30 Minutes Intravenous 3 times per day 05/31/15 1550 06/01/15 1245   05/31/15 0730  ceFAZolin (ANCEF) 3 g in dextrose 5 % 50 mL IVPB     3 g 130 mL/hr over 30 Minutes Intravenous On call to O.R. 05/31/15 0719 05/31/15 0946      Assessment: 65 y.o. obese male admitted 05/31/2015 for L TKA.  No surgical complications; however postop CXR shows bilateral patchy airspace disease highly suspicious for bilat PNA vs. edema.  He was started on vancomycin and cefepime initially, then narrowed to Levaquin alone on 1/14.  Today, antibiotics are broadened to metronidazole per MD and pharmacy is consulted to resume vancomycin dosing.    Today, 06/05/2015: Tmax 100 WBC 13.3 Scr 1.17, CrCl ~ 95 ml/min (~ 63 N)  Antimicrobials this admission: 1/13 >> Ancef >> 1/13 1/13 >> cefepime >> 1/14 1/13 >> vancomycin >> 1/14, Vanc resumed 1/18 >> 1/14 >> Levaquin PO >> 1/18 >> metronidazole >>  Levels/dose changes this admission:  Microbiology Results: 1/13 BCx: NGF 1/13 MRSA PCR: negative  Goal of Therapy:  Vancomycin trough level 15-20 mcg/ml  Plan:  Vancomycin 2500 mg IV stat, then 1250 mg IV q12h.   Measure Vanc trough at  steady state. Follow up renal fxn, culture results, and clinical course.   Gretta Arab PharmD, BCPS Pager [phone] 06/05/2015 3:46 PM

## 2015-06-05 NOTE — Progress Notes (Signed)
Pt Profile: 65 y.o. year old male with a history of HTN, DM, HL, OSA on CPAP, PAD, afib, was admitted 01/10 for L TKA, post-op resp failure req BiPAP, PNA, HTN. Has fallen several times. Orthostatic presyncope and falls (pt has does not remember hx af a fib and I cannot find in outpt records.)    Subjective: No complaints, adding salt to his grits also with sausage  Objective: Vital signs in last 24 hours: Temp:  [98 F (36.7 C)-100 F (37.8 C)] 98 F (36.7 C) (01/18 0449) Pulse Rate:  [66-90] 73 (01/18 0449) Resp:  [15-25] 20 (01/18 0449) BP: (111-197)/(38-90) 143/51 mmHg (01/18 0501) SpO2:  [94 %-99 %] 95 % (01/18 0449) Weight:  [336 lb 10.3 oz (152.7 kg)] 336 lb 10.3 oz (152.7 kg) (01/18 0449) Weight change: 20 lb 4.5 oz (9.2 kg) Last BM Date: 05/31/15 Intake/Output from previous day:  -1232 yesterday 01/17 0701 - 01/18 0700 In: 1198 [P.O.:960; I.V.:238] Out: 2450 [Urine:2450] Intake/Output this shift: Total I/O In: -  Out: 450 [Urine:450]  PE: General:Pleasant affect, NAD Skin:Warm and dry, brisk capillary refill HEENT:normocephalic, sclera clear, mucus membranes moist Heart:S1S2 RRR with 2/6 systolic murmur, no gallup, rub or click Lungs:diminished without rales, rhonchi, or wheezes [id number], non tender, + BS, do not palpate liver spleen or masses Ext+ lower ext edema, 2+ radial pulses Neuro:alert and oriented X 3, MAE, follows commands, + facial symmetry  TELE: SR no brady, no ventricular beats.   Lab Results:  Recent Labs  06/04/15 0340 06/05/15 0534  WBC 12.6* 13.3*  HGB 9.5* 9.1*  HCT 30.5* 29.6*  PLT 305 352   BMET  Recent Labs  06/04/15 0340 06/05/15 0534  NA 139 140  K 3.7 4.1  CL 99* 99*  CO2 29 30  GLUCOSE 152* 149*  BUN 25* 25*  CREATININE 1.08 1.17  CALCIUM 8.4* 8.6*   No results for input(s): TROPONINI in the last 72 hours.  Invalid input(s): CK, MB  No results found for: CHOL, HDL, LDLCALC, LDLDIRECT, TRIG,  CHOLHDL Lab Results  Component Value Date   HGBA1C 6.8* 05/24/2015        Studies/Results: No results found.  Medications: I have reviewed the patient's current medications. Scheduled Meds: . antiseptic oral rinse  7 mL Mouth Rinse q12n4p  . apixaban  2.5 mg Oral Q12H  . atorvastatin  20 mg Oral QHS  . carvedilol  12.5 mg Oral BID WC  . chlorhexidine  15 mL Mouth Rinse BID  . diazepam  5 mg Oral BID  . diltiazem  360 mg Oral Daily  . docusate sodium  100 mg Oral BID  . furosemide  40 mg Intravenous Daily  . gabapentin  300 mg Oral BID  . lisinopril  20 mg Oral Daily   And  . hydrochlorothiazide  12.5 mg Oral Daily  . insulin aspart  0-15 Units Subcutaneous TID WC  . insulin detemir  20 Units Subcutaneous QHS  . isosorbide-hydrALAZINE  2 tablet Oral BID  . levofloxacin  750 mg Oral Daily  . oxybutynin  5 mg Oral BID   Continuous Infusions: . 0.9 % NaCl with KCl 20 mEq / L 10 mL/hr at 06/04/15 2200   PRN Meds:.acetaminophen **OR** acetaminophen, bisacodyl, diphenhydrAMINE, gi cocktail, guaiFENesin-dextromethorphan, hydrALAZINE, HYDROmorphone (DILAUDID) injection, levalbuterol, menthol-cetylpyridinium **OR** phenol, methocarbamol **OR** methocarbamol (ROBAXIN)  IV, metoCLOPramide **OR** metoCLOPramide (REGLAN) injection, ondansetron **OR** ondansetron (ZOFRAN) IV, oxyCODONE, polyethylene glycol, sodium phosphate, traMADol  Assessment/Plan: Principal Problem:  Acute respiratory failure with hypoxia (HCC) Active Problems:   Essential hypertension   Obstructive sleep apnea   Venous stasis ulcers of both lower extremities   Hypertensive urgency   S/P left knee arthroscopy   DM (diabetes mellitus) type II controlled peripheral vascular disorder (HCC)   Morbid obesity due to excess calories (HCC)   Bradycardia with 41 - 50 beats per minute   Sepsis, unspecified organism [facility])   Pulmonary vascular congestion   Postoperative anemia due to acute blood loss  Acute respiratory  failure with hypoxia (HCC) - on room air this morning, denies dyspnea - appears to be secondary to bilateral PNA, per CXR.  - CT angio done 1/15 without evidence of PE and improvement of airspace processes - pt is on Levaquin, today is day #6 of ABX, fever curve improving - continue Lasix  Hypertensive urgency - pt on Coreg 12.5 mg BID, Cardizem 360 mg QD, lasix 40 mg PRN, lisinopril - HCTZ at home  - Coreg was held post op due to bradycardia but was resumed 1/15 - lisinopril - HCTZ was held pre op, was resumed 1/15 - also added Hydralazine 1/15.  BiDIl added now at BID Increase to TID.  Pulmonary vascular congestion - last 2 D ECHO 04/2015 with normal EF - once more euvolemic, on PO Lasix   OSA, OHS - CPAP at night time   S/PTOTAL LEFT KNEE ARTHROPLASTY  , post op day #5 - per ortho team  - Eliquis for DVT prophylaxis    LOS: 5 days   Time spent with pt. :15 minutes. [facility] R  Nurse Practitioner Certified Pager [phone] or after 5pm and on weekends call [phone] 06/05/2015, 8:07 AM   History and all data above reviewed.  Patient examined.  I agree with the findings as above. No chest pain.  No acute SOB. The patient exam reveals COR:RRR  ,  Lungs: Clear  ,  Abd: Positive bowel sounds, no rebound no guarding, Ext Trace edema  .  All available labs, radiology testing, previous records reviewed. Agree with documented assessment and plan. HTN:  BP not improved.  I will increase Bidil to tid.    Jeneen Rinks Arvella Massingale  1:41 PM  06/05/2015

## 2015-06-05 NOTE — Progress Notes (Signed)
Physical Therapy Treatment Patient Details Name: SAHID MILICI MRN: [id number] DOB: [birth year] Today's Date: 06/05/2015    History of Present Illness Pt is a 65 year old male s/p L TKA with post op acute respiratory failure with hypoxia initially requiring BiPAP and hypertensive urgency.  PMHx HTN, DM, PAD, OSA, obesity    PT Comments    Pt assisted OOB to recliner and remains +2 max assist for mobility at this time.  Cardiologist in to see pt and reports adjusting BP meds.  Pt mobility limited today by weakness and dizziness.  Follow Up Recommendations  SNF     Equipment Recommendations  Rolling walker with 5" wheels    Recommendations for Other Services       Precautions / Restrictions Precautions Precautions: Fall;Knee Precaution Comments: monitor vitals Required Braces or Orthoses: Knee Immobilizer - Left Knee Immobilizer - Left: Discontinue once straight leg raise with < 10 degree lag Restrictions Other Position/Activity Restrictions: WBAT    Mobility  Bed Mobility Overal bed mobility: Needs Assistance;+2 for physical assistance Bed Mobility: Supine to Sit     Supine to sit: Mod assist;HOB elevated     General bed mobility comments: assist for L LE over EOB, assist for trunk upright  Transfers Overall transfer level: Needs assistance Equipment used: Rolling walker (2 wheeled) Transfers: Sit to/from Stand Sit to Stand: +2 physical assistance;+2 safety/equipment;Max assist;From elevated surface Stand pivot transfers: Max assist;+2 physical assistance;+2 safety/equipment       General transfer comment: verbal cues for UE and LE positioning, assist to rise and steady using bed raised, poor control of descent due to dizziness, small shuffle steps to turn to recliner, recliner brought up behind pt due to pt's difficulty backing up and dizziness.  Ambulation/Gait                 Stairs            Wheelchair Mobility    Modified Rankin (Stroke  Patients Only)       Balance                                    Cognition Arousal/Alertness: Awake/alert Behavior During Therapy: WFL for tasks assessed/performed Overall Cognitive Status: Within Functional Limits for tasks assessed                      Exercises      General Comments        Pertinent Vitals/Pain Pain Assessment: 0-10 Pain Score: 7  Pain Location: L knee Pain Descriptors / Indicators: Sore Pain Intervention(s): Limited activity within patient's tolerance;Monitored during session;Repositioned    Home Living                      Prior Function            PT Goals (current goals can now be found in the care plan section) Progress towards PT goals: Not progressing toward goals - comment (dizziness)    Frequency  7X/week    PT Plan Current plan remains appropriate    Co-evaluation             End of Session Equipment Utilized During Treatment: Gait belt;Left knee immobilizer Activity Tolerance: Treatment limited secondary to medical complications (Comment) (dizziness) Patient left: with call bell/phone within reach;with family/visitor present;in chair;with chair alarm set     Time: [id number] PT Time  Calculation (min) (ACUTE ONLY): 21 min  Charges:  $Therapeutic Activity: 8-22 mins                    G Codes:      Samaia Iwata,KATHrine E 30-Jun-2015, 3:34 PM Carmelia Bake, PT, DPT 06-30-15 Pager: [phone]

## 2015-06-06 ENCOUNTER — Inpatient Hospital Stay (HOSPITAL_COMMUNITY): Payer: Medicare Other

## 2015-06-06 DIAGNOSIS — J690 Pneumonitis due to inhalation of food and vomit: Secondary | ICD-10-CM | POA: Diagnosis present

## 2015-06-06 LAB — URINALYSIS W MICROSCOPIC (NOT AT ARMC)
Bilirubin Urine: NEGATIVE
GLUCOSE, UA: NEGATIVE mg/dL
Hgb urine dipstick: NEGATIVE
Ketones, ur: NEGATIVE mg/dL
Leukocytes, UA: NEGATIVE
NITRITE: NEGATIVE
PH: 5 (ref 5.0–8.0)
Protein, ur: 100 mg/dL — AB
SPECIFIC GRAVITY, URINE: 1.015 (ref 1.005–1.030)

## 2015-06-06 LAB — CBC
HEMATOCRIT: 28.6 % — AB (ref 39.0–52.0)
Hemoglobin: 8.9 g/dL — ABNORMAL LOW (ref 13.0–17.0)
MCH: 28.5 pg (ref 26.0–34.0)
MCHC: 31.1 g/dL (ref 30.0–36.0)
MCV: 91.7 fL (ref 78.0–100.0)
PLATELETS: 334 10*3/uL (ref 150–400)
RBC: 3.12 MIL/uL — AB (ref 4.22–5.81)
RDW: 15.3 % (ref 11.5–15.5)
WBC: 13.3 10*3/uL — AB (ref 4.0–10.5)

## 2015-06-06 LAB — BASIC METABOLIC PANEL
ANION GAP: 8 (ref 5–15)
BUN: 26 mg/dL — ABNORMAL HIGH (ref 6–20)
CO2: 29 mmol/L (ref 22–32)
Calcium: 8.5 mg/dL — ABNORMAL LOW (ref 8.9–10.3)
Chloride: 100 mmol/L — ABNORMAL LOW (ref 101–111)
Creatinine, Ser: 1.21 mg/dL (ref 0.61–1.24)
GLUCOSE: 149 mg/dL — AB (ref 65–99)
POTASSIUM: 4.8 mmol/L (ref 3.5–5.1)
Sodium: 137 mmol/L (ref 135–145)

## 2015-06-06 LAB — GLUCOSE, CAPILLARY
GLUCOSE-CAPILLARY: 200 mg/dL — AB (ref 65–99)
Glucose-Capillary: 133 mg/dL — ABNORMAL HIGH (ref 65–99)
Glucose-Capillary: 183 mg/dL — ABNORMAL HIGH (ref 65–99)
Glucose-Capillary: 212 mg/dL — ABNORMAL HIGH (ref 65–99)

## 2015-06-06 MED ORDER — PIPERACILLIN-TAZOBACTAM 3.375 G IVPB
3.3750 g | Freq: Three times a day (TID) | INTRAVENOUS | Status: DC
  Administered 2015-06-06 – 2015-06-10 (×11): 3.375 g via INTRAVENOUS
  Filled 2015-06-06 (×12): qty 50

## 2015-06-06 MED ORDER — PIPERACILLIN-TAZOBACTAM 3.375 G IVPB 30 MIN: 3.3750 g | Freq: Three times a day (TID) | INTRAVENOUS | Status: DC

## 2015-06-06 MED ORDER — LACTULOSE 10 GM/15ML PO SOLN
20.0000 g | Freq: Two times a day (BID) | ORAL | Status: DC | PRN
  Administered 2015-06-06 – 2015-06-09 (×2): 20 g via ORAL
  Filled 2015-06-06 (×3): qty 30

## 2015-06-06 NOTE — Care Management Note (Signed)
Case Management Note  Patient Details  Name: Mario Mills MRN: [id number] Date of Birth: [birth year]  Subjective/Objective:  Ortho-s/p L TKA.Sepsis-iv abx,iv lasix. PT-recc SNF-CSW following.                  Action/Plan:d/c plan SNF.   Expected Discharge Date:                  Expected Discharge Plan:  Skilled Nursing Facility  In-House Referral:  Clinical Social Work  Discharge planning Services  CM Consult  Post Acute Care Choice:    Choice offered to:     DME Arranged:    DME Agency:     HH Arranged:    [place] Agency:     Status of Service:  In process, will continue to follow  Medicare Important Message Given:  Yes Date Medicare IM Given:    Medicare IM give by:    Date Additional Medicare IM Given:    Additional Medicare Important Message give by:     If discussed at [place] of Stay Meetings, dates discussed:    Additional Comments:  Dessa Phi, RN 06/06/2015, 3:36 PM

## 2015-06-06 NOTE — Progress Notes (Signed)
Physical Therapy Treatment Patient Details Name: Mario Mills MRN: [id number] DOB: 1951-01-27 Today's Date: 06/06/2015    History of Present Illness Pt is a 65 year old male s/p L TKA with post op acute respiratory failure with hypoxia initially requiring BiPAP and hypertensive urgency.  PMHx HTN, DM, PAD, OSA, obesity    PT Comments    POD # 6 pm session.   Amb pt again from hallway back to bed.  Requires + 2 assist for safety.  Assisted back to bed + 2 assist.    Follow Up Recommendations  SNF     Equipment Recommendations       Recommendations for Other Services       Precautions / Restrictions Precautions Precautions: Fall;Knee Precaution Comments: instructed on KI use for amb Required Braces or Orthoses: Knee Immobilizer - Left Knee Immobilizer - Left: Discontinue once straight leg raise with < 10 degree lag Restrictions Weight Bearing Restrictions: No Other Position/Activity Restrictions: WBAT    Mobility  Bed Mobility Overal bed mobility: Needs Assistance;+2 for physical assistance Bed Mobility: Sit to Supine     Supine to sit: Max assist Sit to supine: Max assist;+2 for physical assistance;+2 for safety/equipment   General bed mobility comments: + 2 assist to get back to bed   Transfers Overall transfer level: Needs assistance Equipment used: Rolling walker (2 wheeled) Transfers: Sit to/from Stand Sit to Stand: +2 physical assistance;+2 safety/equipment;Max assist;From elevated surface         General transfer comment: 25% VC's on proper hand placement to "power up" off elevated bed.  Initial posterior lean.  Difficulty to right self.  Assist with sdecend as pt tends to "plop".   Ambulation/Gait Ambulation/Gait assistance: Mod assist;+2 physical assistance;+2 safety/equipment Ambulation Distance (Feet): 10 Feet Assistive device: Rolling walker (2 wheeled) Gait Pattern/deviations: Step-to pattern;Trunk flexed Gait velocity: decreased   General  Gait Details: requireds max encouragement to increase amb distance.Very unsteady.     Stairs            Wheelchair Mobility    Modified Rankin (Stroke Patients Only)       Balance                                    Cognition Arousal/Alertness: Awake/alert Behavior During Therapy: WFL for tasks assessed/performed Overall Cognitive Status: Within Functional Limits for tasks assessed                      Exercises      General Comments        Pertinent Vitals/Pain Pain Assessment: 0-10 Pain Score: 3  Pain Location: L knee Pain Descriptors / Indicators: Sore Pain Intervention(s): Monitored during session;Premedicated before session;Repositioned;Ice applied    Home Living                      Prior Function            PT Goals (current goals can now be found in the care plan section) Progress towards PT goals: Progressing toward goals    Frequency  7X/week    PT Plan Current plan remains appropriate    Co-evaluation             End of Session Equipment Utilized During Treatment: Gait belt;Left knee immobilizer Activity Tolerance: Patient tolerated treatment well;Patient limited by fatigue Patient left: in chair;with call bell/phone within reach;with family/visitor  present     Time: 1350-1410 PT Time Calculation (min) (ACUTE ONLY): 20 min  Charges:  $Gait Training: 8-22 mins                     G Codes:      Rica Koyanagi  PTA [facility]  Acute  Rehab Pager      [phone]

## 2015-06-06 NOTE — Progress Notes (Signed)
Mario Mills [id number] DOB: 1951-03-22 DOA: 05/31/2015 PCP: Mario Fendt, MD  Summary&Daily Progress Notes 06/05/15: I have seen and examined Mario Mills at bedside, reviewed his chart and discussed with orthopedics. Appreciate cardiology/orthopedics. 65 y.o. year old male with end stage OA of his left knee with progressively worsening pain and dysfunction, admitted for left Total Knee Arthroplasty. Post op hypoxic and placed on BiPAP, TRH consulted to evaluate post op hypoxia. He may have aspirated, staph aureus swab positive earlier in the admission. His white count is going up after transitioning to Levaquin. We'll therefore add back vancomycin and give Flagyl and reassess. He feels better. 06/06/15: Appreciate orthopedics/cardiology. Patient continued to have fever last night and her white count has increased. He may still have residual aspiration pneumonitis. Will change antibiotics to Zosyn only and reassess. If patient continues to have fever and white count continues to increase, would then look to perform further investigations for the possibilities of infection. Meanwhile, will repeat UA/urine culture/chest x-ray. Patient drowsy this afternoon. Problem List Plan  Principal Problem:   Acute respiratory failure with hypoxia (HCC) Active Problems:   Essential hypertension   Obstructive sleep apnea   Venous stasis ulcers of both lower extremities   Hypertensive urgency   S/P left knee arthroscopy   DM (diabetes mellitus) type II controlled peripheral vascular disorder (HCC)   Morbid obesity due to excess calories (HCC)   Bradycardia with 41 - 50 beats per minute   Sepsis, unspecified organism [facility])   Pulmonary vascular congestion   Postoperative anemia due to acute blood loss   Aspiration pneumonitis (HCC)   Discontinue vancomycin/Levaquin/Flagyl  Zosyn  Chest x-ray  UA/urine culture  Follow cardiology/orthopedics recommendations   Code Status: Full Code Family  Communication: Patient at bedside Disposition Plan: Per ortho   Consultants:  Cardiology  Orthopedics  Procedures:  Left total knee replacement  Antibiotics:  Levaquin>06/06/15  Vancomycin>06/06/15  Flagyl>06/06/15  Zosyn 06/06/15>  HPI/Subjective: Feels sleepy.  Objective: Filed Vitals:   06/06/15 0647 06/06/15 1320  BP: 131/62 151/54  Pulse: 82 72  Temp: 100 F (37.8 C) 99.8 F (37.7 C)  Resp: 20 20    Intake/Output Summary (Last 24 hours) at 06/06/15 2025 Last data filed at 06/06/15 1826  Gross per 24 hour  Intake 2851.67 ml  Output   1225 ml  Net 1626.67 ml   Filed Weights   06/04/15 0700 06/05/15 0449 06/06/15 0533  Weight: 144.8 kg (319 lb 3.6 oz) 152.7 kg (336 lb 10.3 oz) 148.099 kg (326 lb 8 oz)    Exam:   General:  Comfortable at rest.  Cardiovascular: S1-S2 normal. No murmurs. Pulse regular.  Respiratory: Good air entry bilaterally. No rhonchi or rales.  Abdomen: Soft and nontender. Normal bowel sounds. No organomegaly.  Musculoskeletal: No pedal edema   Neurological: Intact  Data Reviewed: Basic Metabolic Panel:  Recent Labs Lab 06/02/15 0405 06/03/15 0340 06/04/15 0340 06/05/15 0534 06/06/15 0520  NA 137 139 139 140 137  K 3.7 3.5 3.7 4.1 4.8  CL 102 99* 99* 99* 100*  CO2 27 30 29 30 29   GLUCOSE 182* 171* 152* 149* 149*  BUN 20 20 25* 25* 26*  CREATININE 1.01 0.98 1.08 1.17 1.21  CALCIUM 8.2* 8.4* 8.4* 8.6* 8.5*   Liver Function Tests:  Recent Labs Lab 05/31/15 1710  AST 45*  ALT 68*  ALKPHOS 72  BILITOT 0.6  PROT 7.3  ALBUMIN 3.3*   No results for input(s): LIPASE, AMYLASE in the last 168  hours. No results for input(s): AMMONIA in the last 168 hours. CBC:  Recent Labs Lab 05/31/15 1710  06/02/15 0405 06/03/15 0340 06/04/15 0340 06/05/15 0534 06/06/15 0520  WBC 16.4*  < > 15.6* 14.5* 12.6* 13.3* 13.3*  NEUTROABS 15.0*  --   --   --   --   --   --   HGB 12.6*  < > 9.8* 9.4* 9.5* 9.1* 8.9*  HCT 40.6   < > 31.1* 29.9* 30.5* 29.6* 28.6*  MCV 93.3  < > 91.5 92.6 92.7 93.7 91.7  PLT 350  < > 288 275 305 352 334  < > = values in this interval not displayed. Cardiac Enzymes: No results for input(s): CKTOTAL, CKMB, CKMBINDEX, TROPONINI in the last 168 hours. BNP (last 3 results) No results for input(s): BNP in the last 8760 hours.  ProBNP (last 3 results) No results for input(s): PROBNP in the last 8760 hours.  CBG:  Recent Labs Lab 06/05/15 1715 06/05/15 2059 06/06/15 0751 06/06/15 1319 06/06/15 1642  GLUCAP 200* 214* 133* 200* 183*    Recent Results (from the past 240 hour(s))  MRSA PCR Screening     Status: None   Collection Time: 05/31/15  4:10 PM  Result Value Ref Range Status   MRSA by PCR NEGATIVE NEGATIVE Final    Comment:        The GeneXpert MRSA Assay (FDA approved for NASAL specimens only), is one component of a comprehensive MRSA colonization surveillance program. It is not intended to diagnose MRSA infection nor to guide or monitor treatment for MRSA infections.   Culture, blood (routine x 2) Call MD if unable to obtain prior to antibiotics being given     Status: None   Collection Time: 05/31/15  5:10 PM  Result Value Ref Range Status   Specimen Description RIGHT ANTECUBITAL  Final   Special Requests BOTTLES DRAWN AEROBIC AND ANAEROBIC 10CC  Final   Culture   Final    NO GROWTH 5 DAYS Performed at [facility]    Report Status 06/05/2015 FINAL  Final  Culture, blood (routine x 2) Call MD if unable to obtain prior to antibiotics being given     Status: None   Collection Time: 05/31/15  5:22 PM  Result Value Ref Range Status   Specimen Description BLOOD RIGHT ARM  Final   Special Requests BOTTLES DRAWN AEROBIC AND ANAEROBIC 10CC  Final   Culture   Final    NO GROWTH 5 DAYS Performed at [facility]    Report Status 06/05/2015 FINAL  Final     Studies: Dg Chest Port 1 View  06/06/2015  CLINICAL DATA:  Shortness of breath following  knee replacement 05/31/2015. EXAM: PORTABLE CHEST - 1 VIEW COMPARISON:  CTA chest 06/02/2015.  One-view chest x-ray 05/31/2015 FINDINGS: Low lung volumes exaggerate the heart size and interstitium. There is no significant edema or effusion. There significant clearing of interstitial and airspace disease since the prior exam. No focal airspace disease is present. The visualized soft tissues and bony thorax are unremarkable. IMPRESSION: 1. Interval clearing of interstitial and airspace disease. 2. No acute cardiopulmonary disease. Electronically Signed   By: San Morelle M.D.   On: 06/06/2015 14:31    Scheduled Meds: . antiseptic oral rinse  7 mL Mouth Rinse q12n4p  . apixaban  2.5 mg Oral Q12H  . atorvastatin  20 mg Oral QHS  . carvedilol  12.5 mg Oral BID WC  . chlorhexidine  15  mL Mouth Rinse BID  . diazepam  5 mg Oral BID  . diltiazem  360 mg Oral Daily  . docusate sodium  100 mg Oral BID  . furosemide  40 mg Intravenous Daily  . gabapentin  300 mg Oral BID  . lisinopril  20 mg Oral Daily   And  . hydrochlorothiazide  12.5 mg Oral Daily  . insulin aspart  0-15 Units Subcutaneous TID WC  . insulin detemir  20 Units Subcutaneous QHS  . isosorbide-hydrALAZINE  2 tablet Oral TID  . metronidazole  500 mg Intravenous Q8H  . oxybutynin  5 mg Oral BID  . pantoprazole  40 mg Oral Q1200  . piperacillin-tazobactam (ZOSYN)  IV  3.375 g Intravenous 3 times per day   Continuous Infusions: . 0.9 % NaCl with KCl 20 mEq / L 50 mL/hr at 06/06/15 0535     Time spent: 25 minutes    Christabella Alvira  [facility] Pager [phone]. If 7PM-7AM, please contact night-coverage at [email or web address], password [facility] 06/06/2015, 8:25 PM  LOS: 6 days

## 2015-06-06 NOTE — Progress Notes (Signed)
Subjective: 6 Days Post-Op Procedure(s) (LRB): TOTAL LEFT  KNEE ARTHROPLASTY (Left) Patient reports pain as mild.   Patient seen in rounds by Dr. Wynelle Link. Patient is well, and has had no acute complaints or problems Plan is to go Skilled nursing facility after hospital stay.  Objective: Vital signs in last 24 hours: Temp:  [98 F (36.7 C)-100.3 F (37.9 C)] 100 F (37.8 C) (01/19 0647) Pulse Rate:  [71-84] 82 (01/19 0647) Resp:  [20] 20 (01/19 0647) BP: (131-162)/(60-64) 131/62 mmHg (01/19 0647) SpO2:  [96 %-99 %] 99 % (01/19 0647) Weight:  [148.099 kg (326 lb 8 oz)] 148.099 kg (326 lb 8 oz) (01/19 0533)  Intake/Output from previous day:  Intake/Output Summary (Last 24 hours) at 06/06/15 0936 Last data filed at 06/06/15 0900  Gross per 24 hour  Intake 2517.33 ml  Output   1225 ml  Net 1292.33 ml    Intake/Output this shift: Total I/O In: -  Out: 400 [Urine:400]  Labs:  Recent Labs  06/04/15 0340 06/05/15 0534 06/06/15 0520  HGB 9.5* 9.1* 8.9*    Recent Labs  06/05/15 0534 06/06/15 0520  WBC 13.3* 13.3*  RBC 3.16* 3.12*  HCT 29.6* 28.6*  PLT 352 334    Recent Labs  06/05/15 0534 06/06/15 0520  NA 140 137  K 4.1 4.8  CL 99* 100*  CO2 30 29  BUN 25* 26*  CREATININE 1.17 1.21  GLUCOSE 149* 149*  CALCIUM 8.6* 8.5*   No results for input(s): LABPT, INR in the last 72 hours.  EXAM General - Patient is Alert and Appropriate Extremity - Neurovascular intact Sensation intact distally Dressing/Incision - clean, dry Motor Function - intact, moving foot and toes well on exam.   Past Medical History  Diagnosis Date  . Hypertension   . Diabetes mellitus   . Hyperlipidemia   . Obstructive sleep apnea     CPAP  . Peripheral arterial disease ([place])   . Venous insufficiency   . Venous stasis ulcer ([place])   . DOE (dyspnea on exertion)     WITH MINIMAL EXERTION  . HA (headache)     WAKE UP FREQUENTLY WITH HA  . History of Doppler ultrasound  12/2012    LE VENOUS REFLUX demostrated on L (vein stripping on R w/MRSA); h/o venous stasis bilat   . Atrial fibrillation ([place])   . Heart murmur   . Dysrhythmia   . History of urinary tract infection   . Urinary incontinence   . Arthritis   . Numbness and tingling     feet bilat   . Falls     Assessment/Plan: 6 Days Post-Op Procedure(s) (LRB): TOTAL LEFT  KNEE ARTHROPLASTY (Left) Principal Problem:   Acute respiratory failure with hypoxia (HCC) Active Problems:   Essential hypertension   Obstructive sleep apnea   Venous stasis ulcers of both lower extremities   Hypertensive urgency   S/P left knee arthroscopy   DM (diabetes mellitus) type II controlled peripheral vascular disorder (HCC)   Morbid obesity due to excess calories (HCC)   Bradycardia with 41 - 50 beats per minute   Sepsis, unspecified organism ([place])   Pulmonary vascular congestion   Postoperative anemia due to acute blood loss  Estimated body mass index is 43.09 kg/(m^2) as calculated from the following:   Height as of this encounter: 6\' 1"  (1.854 m).   Weight as of this encounter: 148.099 kg (326 lb 8 oz). Up with therapy  Possibly tomorrow if continues to  improve  DVT Prophylaxis - Eliquis Weight-Bearing as tolerated to left leg  Arlee Muslim, PA-C Orthopaedic Surgery 06/06/2015, 9:36 AM

## 2015-06-06 NOTE — Progress Notes (Signed)
Physical Therapy Treatment Patient Details Name: Mario Mills MRN: [id number] DOB: [birth year] Today's Date: 06/06/2015    History of Present Illness Pt is a 65 year old male s/p L TKA with post op acute respiratory failure with hypoxia initially requiring BiPAP and hypertensive urgency.  PMHx HTN, DM, PAD, OSA, obesity    PT Comments    POD # 6 am session Applied KI as pt still unable to perform active SLR.  Assisted OOB + 2 assist.  Amb a limited distance with recliner following for safety.  Pt requires MAX encouragement to increase activity.  Performed TKR TE's while in recliner followed by ICE.   Follow Up Recommendations  SNF     Equipment Recommendations       Recommendations for Other Services       Precautions / Restrictions Precautions Precautions: Fall;Knee Precaution Comments: instructed on KI use for amb Required Braces or Orthoses: Knee Immobilizer - Left Knee Immobilizer - Left: Discontinue once straight leg raise with < 10 degree lag Restrictions Weight Bearing Restrictions: No Other Position/Activity Restrictions: WBAT    Mobility  Bed Mobility Overal bed mobility: Needs Assistance;+2 for physical assistance Bed Mobility: Supine to Sit     Supine to sit: Max assist     General bed mobility comments: assist for L LE over EOB, assist for trunk upright  Transfers Overall transfer level: Needs assistance Equipment used: Rolling walker (2 wheeled) Transfers: Sit to/from Stand Sit to Stand: +2 physical assistance;+2 safety/equipment;Max assist;From elevated surface         General transfer comment: 25% VC's on proper hand placement to "power up" off elevated bed.  Initial posterior lean.  Difficulty to right self.  Assist with sdecend as pt tends to "plop".   Ambulation/Gait Ambulation/Gait assistance: Mod assist;+2 physical assistance;+2 safety/equipment Ambulation Distance (Feet): 15 Feet Assistive device: Rolling walker (2 wheeled) Gait  Pattern/deviations: Step-to pattern;Trunk flexed Gait velocity: decreased   General Gait Details: requireds max encouragement to increase amb distance.Very unsteady.     Stairs            Wheelchair Mobility    Modified Rankin (Stroke Patients Only)       Balance                                    Cognition Arousal/Alertness: Awake/alert Behavior During Therapy: WFL for tasks assessed/performed Overall Cognitive Status: Within Functional Limits for tasks assessed                      Exercises   Total Knee Replacement TE's 10 reps B LE ankle pumps 10 reps towel squeezes 10 reps knee presses 10 reps heel slides  10 reps SAQ's 10 reps SLR's 10 reps ABD Followed by ICE     General Comments        Pertinent Vitals/Pain Pain Assessment: 0-10 Pain Score: 3  Pain Location: L knee Pain Descriptors / Indicators: Sore Pain Intervention(s): Monitored during session;Premedicated before session;Repositioned;Ice applied    Home Living                      Prior Function            PT Goals (current goals can now be found in the care plan section) Progress towards PT goals: Progressing toward goals    Frequency  7X/week    PT Plan Current plan  remains appropriate    Co-evaluation             End of Session Equipment Utilized During Treatment: Gait belt;Left knee immobilizer Activity Tolerance: Patient tolerated treatment well;Patient limited by fatigue Patient left: in chair;with call bell/phone within reach;with family/visitor present     Time: [id number] PT Time Calculation (min) (ACUTE ONLY): 30 min  Charges:  $Gait Training: 8-22 mins $Therapeutic Exercise: 8-22 mins                    G Codes:      Rica Koyanagi  PTA [facility]  Acute  Rehab Pager      [phone]

## 2015-06-06 NOTE — Progress Notes (Signed)
LATE ENTRY NOTE Date of Service of Visit - Wednesday 06/05/2015  Subjective: 5 Days Post-Op Procedure(s) (LRB): TOTAL LEFT  KNEE ARTHROPLASTY (Left) Patient reports pain as mild.   Patient seen in rounds with Dr. Wynelle Link. Patient is well, and has had no acute complaints or problems Plan is to go Skilled nursing facility after hospital stay.  Objective: Vital signs in last 24 hours: Temp:  [98 F (36.7 C)-100.3 F (37.9 C)] 100 F (37.8 C) (01/19 0647) Pulse Rate:  [71-84] 82 (01/19 0647) Resp:  [20] 20 (01/19 0647) BP: (131-162)/(60-64) 131/62 mmHg (01/19 0647) SpO2:  [96 %-99 %] 99 % (01/19 0647) Weight:  [148.099 kg (326 lb 8 oz)] 148.099 kg (326 lb 8 oz) (01/19 0533)  Intake/Output from previous day:  Intake/Output Summary (Last 24 hours) at 06/06/15 0934 Last data filed at 06/06/15 0900  Gross per 24 hour  Intake 2517.33 ml  Output   1225 ml  Net 1292.33 ml    Intake/Output this shift: Total I/O In: -  Out: 400 [Urine:400]  Labs:  Recent Labs  06/04/15 0340 06/05/15 0534 06/06/15 0520  HGB 9.5* 9.1* 8.9*    Recent Labs  06/05/15 0534 06/06/15 0520  WBC 13.3* 13.3*  RBC 3.16* 3.12*  HCT 29.6* 28.6*  PLT 352 334    Recent Labs  06/05/15 0534 06/06/15 0520  NA 140 137  K 4.1 4.8  CL 99* 100*  CO2 30 29  BUN 25* 26*  CREATININE 1.17 1.21  GLUCOSE 149* 149*  CALCIUM 8.6* 8.5*   No results for input(s): LABPT, INR in the last 72 hours.  EXAM General - Patient is Alert and Appropriate Extremity - Neurovascular intact Sensation intact distally Dressing/Incision - clean, dry Motor Function - intact, moving foot and toes well on exam.   Past Medical History  Diagnosis Date  . Hypertension   . Diabetes mellitus   . Hyperlipidemia   . Obstructive sleep apnea     CPAP  . Peripheral arterial disease ([place])   . Venous insufficiency   . Venous stasis ulcer ([place])   . DOE (dyspnea on exertion)     WITH MINIMAL EXERTION  . HA (headache)    WAKE UP FREQUENTLY WITH HA  . History of Doppler ultrasound 12/2012    LE VENOUS REFLUX demostrated on L (vein stripping on R w/MRSA); h/o venous stasis bilat   . Atrial fibrillation ([place])   . Heart murmur   . Dysrhythmia   . History of urinary tract infection   . Urinary incontinence   . Arthritis   . Numbness and tingling     feet bilat   . Falls     Assessment/Plan: 5 Days Post-Op Procedure(s) (LRB): TOTAL LEFT  KNEE ARTHROPLASTY (Left) Principal Problem:   Acute respiratory failure with hypoxia (HCC) Active Problems:   Essential hypertension   Obstructive sleep apnea   Venous stasis ulcers of both lower extremities   Hypertensive urgency   S/P left knee arthroscopy   DM (diabetes mellitus) type II controlled peripheral vascular disorder (HCC)   Morbid obesity due to excess calories (HCC)   Bradycardia with 41 - 50 beats per minute   Sepsis, unspecified organism ([place])   Pulmonary vascular congestion   Postoperative anemia due to acute blood loss  Estimated body mass index is 43.09 kg/(m^2) as calculated from the following:   Height as of this encounter: 6\' 1"  (1.854 m).   Weight as of this encounter: 148.099 kg (326 lb 8 oz).  Up with therapy and ambulate  DVT Prophylaxis - Eliquis Weight-Bearing as tolerated to left leg  Arlee Muslim, PA-C Orthopaedic Surgery 06/06/2015, 9:34 AM

## 2015-06-06 NOTE — Progress Notes (Addendum)
Pt states he has not had a BM since 1/13.  Fleet enema given with no success.  MD made aware, orders for lactulose placed.  Will continue to monitor closely.  Pt had a very small BM per NT.

## 2015-06-06 NOTE — Progress Notes (Signed)
Inpatient Diabetes Program Recommendations  AACE/[name]: New Consensus Statement on Inpatient Glycemic Control (2015)  Target Ranges:  Prepandial:   less than 140 mg/dL      Peak postprandial:   less than 180 mg/dL (1-2 hours)      Critically ill patients:  140 - 180 mg/dL   Results for VIET, EWELL (MRN [id number]) as of 06/06/2015 09:23  Ref. Range 06/05/2015 07:22 06/05/2015 12:26 06/05/2015 17:15 06/05/2015 20:59  Glucose-Capillary Latest Ref Range: 65-99 mg/dL 145 (H) 178 (H) 200 (H) 214 (H)     Home DM Meds: Levemir 20 units QHS  Amaryl 4 mg daily  Metformin 1000 mg bid  Current Insulin Orders: Levemir 20 units QHS  Novolog Moderate SSI (0-15 units) TID AC     MD- Fasting glucose well controlled on current dose of Levemir. Patient having some elevated postprandial glucose levels.  Eating 80% of meals.  Please consider starting low dose Novolog Meal Coverage while patient's home Metformin and Amaryl are on hold-   Novolog 4 units tid with meals    --Will follow patient during hospitalization--  Wyn Quaker RN, MSN, CDE Diabetes Coordinator Inpatient Glycemic Control Team Team Pager: [phone] (8a-5p)

## 2015-06-06 NOTE — Progress Notes (Signed)
Pt Profile: 65 y.o. year old male with a history of HTN, DM, HL, OSA on CPAP, PAD, afib, was admitted 01/10 for L TKA, post-op resp failure req BiPAP, PNA, HTN. Has fallen several times. Orthostatic presyncope and falls (pt has does not remember hx af a fib and I cannot find in outpt records.)    Subjective: No complaints  Objective: Vital signs in last 24 hours: Temp:  [98 F (36.7 C)-100.3 F (37.9 C)] 100 F (37.8 C) (01/19 0647) Pulse Rate:  [71-84] 82 (01/19 0647) Resp:  [20] 20 (01/19 0647) BP: (131-162)/(60-64) 131/62 mmHg (01/19 0647) SpO2:  [96 %-99 %] 99 % (01/19 0647) Weight:  [326 lb 8 oz (148.099 kg)] 326 lb 8 oz (148.099 kg) (01/19 0533) Weight change: -10 lb 2.3 oz (-4.601 kg) Last BM Date: 05/31/15 Intake/Output from previous day: +722 01/18 0701 - 01/19 0700 In: 2517.3 [P.O.:780; I.V.:787.3; IV Piggyback:950] Out: [id number] [Urine:1275] Intake/Output this shift:    PE: General:Pleasant affect, NAD Skin:Warm and dry, brisk capillary refill HEENT:normocephalic, sclera clear, mucus membranes moist Heart:S1S2 RRR with 2/6 systolic murmur, no gallup, rub or click Lungs:clear without rales, rhonchi, or wheezes [id number], soft, non tender, + BS, do not palpate liver spleen or masses Ext:no to tr lower ext edema, 2+ radial pulses Neuro:alert and oriented X 3, MAE, follows commands, + facial symmetry Tele:  SR   Lab Results:  Recent Labs  06/05/15 0534 06/06/15 0520  WBC 13.3* 13.3*  HGB 9.1* 8.9*  HCT 29.6* 28.6*  PLT 352 334   BMET  Recent Labs  06/05/15 0534 06/06/15 0520  NA 140 137  K 4.1 4.8  CL 99* 100*  CO2 30 29  GLUCOSE 149* 149*  BUN 25* 26*  CREATININE 1.17 1.21  CALCIUM 8.6* 8.5*   No results for input(s): TROPONINI in the last 72 hours.  Invalid input(s): CK, MB  No results found for: CHOL, HDL, LDLCALC, LDLDIRECT, TRIG, CHOLHDL Lab Results  Component Value Date   HGBA1C 6.8* 05/24/2015      Studies/Results: No  results found.  Medications: I have reviewed the patient's current medications. Scheduled Meds: . antiseptic oral rinse  7 mL Mouth Rinse q12n4p  . apixaban  2.5 mg Oral Q12H  . atorvastatin  20 mg Oral QHS  . carvedilol  12.5 mg Oral BID WC  . chlorhexidine  15 mL Mouth Rinse BID  . diazepam  5 mg Oral BID  . diltiazem  360 mg Oral Daily  . docusate sodium  100 mg Oral BID  . furosemide  40 mg Intravenous Daily  . gabapentin  300 mg Oral BID  . lisinopril  20 mg Oral Daily   And  . hydrochlorothiazide  12.5 mg Oral Daily  . insulin aspart  0-15 Units Subcutaneous TID WC  . insulin detemir  20 Units Subcutaneous QHS  . isosorbide-hydrALAZINE  2 tablet Oral TID  . levofloxacin  750 mg Oral Daily  . metronidazole  500 mg Intravenous Q8H  . oxybutynin  5 mg Oral BID  . pantoprazole  40 mg Oral Q1200  . vancomycin  1,250 mg Intravenous Q12H   Continuous Infusions: . 0.9 % NaCl with KCl 20 mEq / L 50 mL/hr at 06/06/15 0535   PRN Meds:.acetaminophen **OR** acetaminophen, bisacodyl, diphenhydrAMINE, gi cocktail, guaiFENesin-dextromethorphan, hydrALAZINE, HYDROmorphone (DILAUDID) injection, levalbuterol, menthol-cetylpyridinium **OR** phenol, methocarbamol **OR** methocarbamol (ROBAXIN)  IV, metoCLOPramide **OR** metoCLOPramide (REGLAN) injection, ondansetron **OR** ondansetron (ZOFRAN) IV, oxyCODONE, polyethylene glycol, sodium  phosphate, traMADol  Assessment/Plan: Principal Problem:   Acute respiratory failure with hypoxia (HCC) Active Problems:   Essential hypertension   Obstructive sleep apnea   Venous stasis ulcers of both lower extremities   Hypertensive urgency   S/P left knee arthroscopy   DM (diabetes mellitus) type II controlled peripheral vascular disorder (HCC)   Morbid obesity due to excess calories (HCC)   Bradycardia with 41 - 50 beats per minute   Sepsis, unspecified organism [facility])   Pulmonary vascular congestion   Postoperative anemia due to acute blood  loss  Acute respiratory failure with hypoxia (HCC) - appears to be secondary to bilateral PNA, per CXR.  - CT angio done 1/15 without evidence of PE and improvement of airspace processes - pt is on Levaquin, today is day #6 of ABX, fever curve improving - continue Lasix po  Hypertensive urgency - pt on Coreg 12.5 mg BID, Cardizem 360 mg QD, lasix 40 mg PRN, lisinopril - HCTZ at home  - Coreg was held post op due to bradycardia but was resumed 1/15 - lisinopril - HCTZ was held pre op, was resumed 1/15 - BiDIl added now at BID Increase to TID.  BP improved today  Pulmonary vascular congestion - last 2 D ECHO 04/2015 with normal EF - change to po lasix  I&O negative 2608; +722   OSA, OHS - CPAP at night time   S/PTOTAL LEFT KNEE ARTHROPLASTY , post op day #6 - per ortho team  - Eliquis for DVT prophylaxis   Fever  Wbc at 13.3 per IM   LOS: 6 days   Time spent with pt. :15 minutes. [facility] R  Nurse Practitioner Certified Pager [phone] or after 5pm and on weekends call [phone] 06/06/2015, 8:16 AM  History and all data above reviewed.  Patient examined.  I agree with the findings as above.  He is constipated and still has some leg pain.  No acute respiratory complaints  The patient exam reveals COR:RRR  ,  Lungs: Decreased at the bases.   ,  Abd: Positive bowel sounds, no rebound no guarding, Ext Mild leg edema  .  All available labs, radiology testing, previous records reviewed. Agree with documented assessment and plan. HTN:  BP is labile but we are waiting to see the trend with the increased Bidil yesterday.  No changes today.    Jeneen Rinks Roshawn Lacina  12:43 PM  06/06/2015

## 2015-06-07 LAB — CBC WITH DIFFERENTIAL/PLATELET
BASOS ABS: 0 10*3/uL (ref 0.0–0.1)
Basophils Relative: 0 %
Eosinophils Absolute: 0.1 10*3/uL (ref 0.0–0.7)
Eosinophils Relative: 1 %
HCT: 29.5 % — ABNORMAL LOW (ref 39.0–52.0)
HEMOGLOBIN: 9.1 g/dL — AB (ref 13.0–17.0)
LYMPHS ABS: 1.9 10*3/uL (ref 0.7–4.0)
LYMPHS PCT: 16 %
MCH: 28.6 pg (ref 26.0–34.0)
MCHC: 30.8 g/dL (ref 30.0–36.0)
MCV: 92.8 fL (ref 78.0–100.0)
Monocytes Absolute: 1.4 10*3/uL — ABNORMAL HIGH (ref 0.1–1.0)
Monocytes Relative: 12 %
NEUTROS ABS: 8.6 10*3/uL — AB (ref 1.7–7.7)
NEUTROS PCT: 71 %
Platelets: 385 10*3/uL (ref 150–400)
RBC: 3.18 MIL/uL — AB (ref 4.22–5.81)
RDW: 15.4 % (ref 11.5–15.5)
WBC: 12 10*3/uL — AB (ref 4.0–10.5)

## 2015-06-07 LAB — BASIC METABOLIC PANEL
Anion gap: 11 (ref 5–15)
BUN: 23 mg/dL — AB (ref 6–20)
CO2: 28 mmol/L (ref 22–32)
CREATININE: 1.16 mg/dL (ref 0.61–1.24)
Calcium: 8.4 mg/dL — ABNORMAL LOW (ref 8.9–10.3)
Chloride: 99 mmol/L — ABNORMAL LOW (ref 101–111)
GFR calc Af Amer: >60 mL/min (ref >60)
GLUCOSE: 170 mg/dL — AB (ref 65–99)
POTASSIUM: 4.1 mmol/L (ref 3.5–5.1)
Sodium: 138 mmol/L (ref 135–145)

## 2015-06-07 LAB — URINE CULTURE: Culture: NO GROWTH

## 2015-06-07 LAB — GLUCOSE, CAPILLARY
GLUCOSE-CAPILLARY: 201 mg/dL — AB (ref 65–99)
Glucose-Capillary: 171 mg/dL — ABNORMAL HIGH (ref 65–99)
Glucose-Capillary: 136 mg/dL — ABNORMAL HIGH (ref 65–99)
Glucose-Capillary: 202 mg/dL — ABNORMAL HIGH (ref 65–99)

## 2015-06-07 MED ORDER — FUROSEMIDE 10 MG/ML IJ SOLN
40.0000 mg | Freq: Two times a day (BID) | INTRAMUSCULAR | Status: DC
  Administered 2015-06-07 – 2015-06-08 (×2): 40 mg via INTRAVENOUS
  Filled 2015-06-07 (×2): qty 4

## 2015-06-07 NOTE — Progress Notes (Signed)
CSW following for discharge to [facility] when medically ready. CSW, Roselyn Reef confirmed with PA, Dian Situ that patient will not be discharged over the weekend. Ivin Booty at [facility] updated.   CSW has completed FL2 & will continue to follow and assist with discharge when ready.    Raynaldo Opitz, [facility] Clinical Social Worker cell #: [phone]

## 2015-06-07 NOTE — Progress Notes (Signed)
Physical Therapy Treatment Patient Details Name: Mario Mills MRN: [id number] DOB: May 19, 1950 Today's Date: 06/07/2015    History of Present Illness Pt is a 65 year old male s/p L TKA with post op acute respiratory failure with hypoxia initially requiring BiPAP and hypertensive urgency.  PMHx HTN, DM, PAD, OSA, obesity    PT Comments    Pt ambulated 15' with RW, distance limited by pain and fatigue. Performed TKA exercises with assist.   Follow Up Recommendations  SNF     Equipment Recommendations  Rolling walker with 5" wheels    Recommendations for Other Services       Precautions / Restrictions Precautions Precautions: Fall;Knee Precaution Comments: instructed on KI use for amb Required Braces or Orthoses: Knee Immobilizer - Left Knee Immobilizer - Left: Discontinue once straight leg raise with < 10 degree lag Restrictions Weight Bearing Restrictions: No Other Position/Activity Restrictions: WBAT    Mobility  Bed Mobility Overal bed mobility: Needs Assistance Bed Mobility: Supine to Sit     Supine to sit: Min assist;HOB elevated     General bed mobility comments: assist for L LE over EOB, assist for trunk upright  Transfers Overall transfer level: Needs assistance Equipment used: Rolling walker (2 wheeled) Transfers: Sit to/from Stand Sit to Stand: +2 physical assistance;+2 safety/equipment;From elevated surface;Mod assist         General transfer comment: assist to power up from elevated bed, verbal cues hand placement  Ambulation/Gait Ambulation/Gait assistance: Min guard;+2 safety/equipment Ambulation Distance (Feet): 15 Feet Assistive device: Rolling walker (2 wheeled) Gait Pattern/deviations: Step-to pattern Gait velocity: decreased   General Gait Details: distance limited by pain/fatigue   Stairs            Wheelchair Mobility    Modified Rankin (Stroke Patients Only)       Balance                                     Cognition Arousal/Alertness: Awake/alert Behavior During Therapy: WFL for tasks assessed/performed Overall Cognitive Status: Within Functional Limits for tasks assessed                      Exercises Total Joint Exercises Ankle Circles/Pumps: AROM;Both;10 reps Quad Sets: AROM;Both;10 reps Short Arc Quad: AROM;Left;20 reps Heel Slides: AAROM;Left;15 reps;Supine Hip ABduction/ADduction: AAROM;Left;10 reps Straight Leg Raises: AAROM;Left;10 reps;Supine Goniometric ROM: 10-50* AAROM L knee    General Comments        Pertinent Vitals/Pain Pain Score: 7  Pain Location: L knee with activity Pain Descriptors / Indicators: Sore Pain Intervention(s): Limited activity within patient's tolerance;Monitored during session;Premedicated before session;Ice applied    Home Living                      Prior Function            PT Goals (current goals can now be found in the care plan section) Acute Rehab PT Goals PT Goal Formulation: With patient Time For Goal Achievement: 06/05/15 Potential to Achieve Goals: Good Progress towards PT goals: Progressing toward goals    Frequency  7X/week    PT Plan Current plan remains appropriate    Co-evaluation             End of Session Equipment Utilized During Treatment: Gait belt;Left knee immobilizer Activity Tolerance: Patient tolerated treatment well;Patient limited by fatigue Patient left: in chair;with  call bell/phone within reach     Time: 1039-1117 PT Time Calculation (min) (ACUTE ONLY): 38 min  Charges:  $Gait Training: 8-22 mins $Therapeutic Exercise: 8-22 mins $Therapeutic Activity: 8-22 mins                    G Codes:      Mario Mills 06/07/2015, 11:23 AM [phone]

## 2015-06-07 NOTE — Progress Notes (Signed)
Placed patient on CPAP via FFM, auto titrate settings.  Patient tolerating well at this time.  

## 2015-06-07 NOTE — Progress Notes (Signed)
Pt Profile: 65 y.o. year old male with a history of HTN, DM, HL, OSA on CPAP, PAD, afib, was admitted 01/10 for L TKA, post-op resp failure req BiPAP, PNA, HTN. Has fallen several times. Orthostatic presyncope and falls (pt has does not remember hx af a fib and I cannot find in outpt records.)    Subjective: Only complained of feeling SOB when came off his CPAP this AM now stable.  Objective: Vital signs in last 24 hours: Temp:  [98.1 F (36.7 C)-101.9 F (38.8 C)] 99.4 F (37.4 C) (01/20 0500) Pulse Rate:  [72-84] 75 (01/20 0500) Resp:  [20] 20 (01/20 0500) BP: (146-151)/(50-68) 148/50 mmHg (01/20 0500) SpO2:  [97 %-100 %] 97 % (01/20 0500) FiO2 (%):  [1.5 %] 1.5 % (01/20 0500) Weight:  [336 lb 4.8 oz (152.545 kg)] 336 lb 4.8 oz (152.545 kg) (01/20 0832) Weight change:  Last BM Date: 06/06/15 Intake/Output from previous day: +1768 01/19 0701 - 01/20 0700 In: 2823.3 [P.O.:1250; I.V.:1173.3; IV Piggyback:400] Out: 1575 [Urine:1575] Intake/Output this shift:    PE: General:Pleasant affect, NAD Skin:Warm and dry, brisk capillary refill HEENT:normocephalic, sclera clear, mucus membranes moist Heart:S1S2 RRR with 2/6 systolic murmur, no gallup, rub or click Lungs:clear without rales, rhonchi, or wheezes [id number], soft, non tender, + BS, do not palpate liver spleen or masses Ext:1-2+ lower ext edema, 2+ radial pulses Neuro:alert and oriented X 3, MAE, follows commands, + facial symmetry Tele: SR rare PVC   Lab Results:  Recent Labs  06/06/15 0520 06/07/15 0511  WBC 13.3* 12.0*  HGB 8.9* 9.1*  HCT 28.6* 29.5*  PLT 334 385   BMET  Recent Labs  06/06/15 0520 06/07/15 0511  NA 137 138  K 4.8 4.1  CL 100* 99*  CO2 29 28  GLUCOSE 149* 170*  BUN 26* 23*  CREATININE 1.21 1.16  CALCIUM 8.5* 8.4*   No results for input(s): TROPONINI in the last 72 hours.  Invalid input(s): CK, MB  No results found for: CHOL, HDL, LDLCALC, LDLDIRECT, TRIG, CHOLHDL Lab  Results  Component Value Date   HGBA1C 6.8* 05/24/2015      Studies/Results: Dg Chest Port 1 View  06/06/2015  CLINICAL DATA:  Shortness of breath following knee replacement 05/31/2015. EXAM: PORTABLE CHEST - 1 VIEW COMPARISON:  CTA chest 06/02/2015.  One-view chest x-ray 05/31/2015 FINDINGS: Low lung volumes exaggerate the heart size and interstitium. There is no significant edema or effusion. There significant clearing of interstitial and airspace disease since the prior exam. No focal airspace disease is present. The visualized soft tissues and bony thorax are unremarkable. IMPRESSION: 1. Interval clearing of interstitial and airspace disease. 2. No acute cardiopulmonary disease. Electronically Signed   By: San Morelle M.D.   On: 06/06/2015 14:31    Medications: I have reviewed the patient's current medications. Scheduled Meds: . antiseptic oral rinse  7 mL Mouth Rinse q12n4p  . apixaban  2.5 mg Oral Q12H  . atorvastatin  20 mg Oral QHS  . carvedilol  12.5 mg Oral BID WC  . chlorhexidine  15 mL Mouth Rinse BID  . diazepam  5 mg Oral BID  . diltiazem  360 mg Oral Daily  . docusate sodium  100 mg Oral BID  . furosemide  40 mg Intravenous Daily  . gabapentin  300 mg Oral BID  . lisinopril  20 mg Oral Daily   And  . hydrochlorothiazide  12.5 mg Oral Daily  . insulin aspart  0-15 Units Subcutaneous TID WC  . insulin detemir  20 Units Subcutaneous QHS  . isosorbide-hydrALAZINE  2 tablet Oral TID  . metronidazole  500 mg Intravenous Q8H  . oxybutynin  5 mg Oral BID  . pantoprazole  40 mg Oral Q1200  . piperacillin-tazobactam (ZOSYN)  IV  3.375 g Intravenous 3 times per day   Continuous Infusions: . 0.9 % NaCl with KCl 20 mEq / L 50 mL/hr at 06/06/15 2200   PRN Meds:.acetaminophen **OR** acetaminophen, bisacodyl, diphenhydrAMINE, gi cocktail, guaiFENesin-dextromethorphan, hydrALAZINE, HYDROmorphone (DILAUDID) injection, lactulose, levalbuterol, menthol-cetylpyridinium **OR**  phenol, methocarbamol **OR** methocarbamol (ROBAXIN)  IV, metoCLOPramide **OR** metoCLOPramide (REGLAN) injection, ondansetron **OR** ondansetron (ZOFRAN) IV, oxyCODONE, polyethylene glycol, traMADol  Assessment/Plan: Principal Problem:   Acute respiratory failure with hypoxia (HCC) Active Problems:   Essential hypertension   Obstructive sleep apnea   Venous stasis ulcers of both lower extremities   Hypertensive urgency   S/P left knee arthroscopy   DM (diabetes mellitus) type II controlled peripheral vascular disorder (HCC)   Morbid obesity due to excess calories (HCC)   Bradycardia with 41 - 50 beats per minute   Sepsis, unspecified organism [facility])   Pulmonary vascular congestion   Postoperative anemia due to acute blood loss   Aspiration pneumonitis (HCC)  Acute respiratory failure with hypoxia (HCC) - appears to be secondary to bilateral PNA, per CXR.  - CT angio done 1/15 without evidence of PE and improvement of airspace processes - pt is on Levaquin, today is day #6 of ABX, fever curve improving - continue Lasix IV still at 40 mg IV daily   Hypertensive urgency - pt on Coreg 12.5 mg BID, Cardizem 360 mg QD, lasix 40 mg PRN, lisinopril - HCTZ at home  - Coreg was held post op due to bradycardia but was resumed 1/15 - lisinopril - HCTZ was held pre op, was resumed 1/15 - BiDIl added now at BID Increase to TID. BP improved  Pulmonary vascular congestion - last 2 D ECHO 04/2015 with normal EF -on IV lasix? Though  I&O negative 1360 he is actually more positive since transfer to tele.  -weight is up from 316 to 336 though weight is labile  -CXR is improved  OSA, OHS - CPAP at night time   S/PTOTAL LEFT KNEE ARTHROPLASTY , post op day #7 - per ortho team  - Eliquis for DVT prophylaxis   Fever Wbc at 12 per IM  LOS: 7 days   Time spent with pt. :15 minutes. [facility] R  Nurse Practitioner Certified Pager [phone] or after 5pm and on weekends call  [phone] 06/07/2015, 8:42 AM  History and all data above reviewed.  Patient examined.  I agree with the findings as above.  Breathing OK.  Ambulated in hallway.   The patient exam reveals COR:RRR  ,  Lungs: Clear  ,  Abd: Positive bowel sounds, no rebound no guarding, Ext Mild edema  .  All available labs, radiology testing, previous records reviewed. Agree with documented assessment and plan. HTN:  BP is improved.  Weight is up and I will give an extra dose of IV Lasix today.  However, this can be changed back to once daily and perhaps PO in the AM.    [place] [facility] [place]  11:26 AM  06/07/2015

## 2015-06-07 NOTE — Progress Notes (Signed)
Inpatient Diabetes Program Recommendations  AACE/[name]: New Consensus Statement on Inpatient Glycemic Control (2015)  Target Ranges:  Prepandial:   less than 140 mg/dL      Peak postprandial:   less than 180 mg/dL (1-2 hours)      Critically ill patients:  140 - 180 mg/dL    Results for Mario Mills, Mario Mills (MRN [id number]) as of 06/07/2015 09:38  Ref. Range 06/06/2015 07:51 06/06/2015 13:19 06/06/2015 16:42 06/06/2015 21:22  Glucose-Capillary Latest Ref Range: 65-99 mg/dL 133 (H) 200 (H) 183 (H) 212 (H)     Home DM Meds: Levemir 20 units QHS  Amaryl 4 mg daily  Metformin 1000 mg bid  Current Insulin Orders: Levemir 20 units QHS  Novolog Moderate SSI (0-15 units) TID AC     MD- Fasting glucose well controlled on current dose of Levemir. Patient having some elevated postprandial glucose levels. Eating 80-100% of meals.  Please consider starting low dose Novolog Meal Coverage while patient's home Metformin and Amaryl are on hold-   Novolog 4 units tid with meals    --Will follow patient during hospitalization--  Wyn Quaker RN, MSN, CDE Diabetes Coordinator Inpatient Glycemic Control Team Team Pager: [phone] (8a-5p)

## 2015-06-07 NOTE — Progress Notes (Signed)
   Subjective: 7 Days Post-Op Procedure(s) (LRB): TOTAL LEFT  KNEE ARTHROPLASTY (Left) Patient reports pain as mild.   Plan is to go Rehab after hospital stay.  Objective: Vital signs in last 24 hours: Temp:  [98.1 F (36.7 C)-101.9 F (38.8 C)] 99.5 F (37.5 C) (01/20 0958) Pulse Rate:  [68-84] 68 (01/20 0958) Resp:  [16-20] 16 (01/20 0958) BP: (119-151)/(50-68) 119/56 mmHg (01/20 0958) SpO2:  [97 %-100 %] 97 % (01/20 0958) FiO2 (%):  [1.5 %] 1.5 % (01/20 0500) Weight:  [152.545 kg (336 lb 4.8 oz)] 152.545 kg (336 lb 4.8 oz) (01/20 0832)  Intake/Output from previous day:  Intake/Output Summary (Last 24 hours) at 06/07/15 1146 Last data filed at 06/07/15 1128  Gross per 24 hour  Intake 2573.34 ml  Output   1825 ml  Net 748.34 ml    Intake/Output this shift: Total I/O In: -  Out: 650 [Urine:650]  Labs:  Recent Labs  06/05/15 0534 06/06/15 0520 06/07/15 0511  HGB 9.1* 8.9* 9.1*    Recent Labs  06/06/15 0520 06/07/15 0511  WBC 13.3* 12.0*  RBC 3.12* 3.18*  HCT 28.6* 29.5*  PLT 334 385    Recent Labs  06/06/15 0520 06/07/15 0511  NA 137 138  K 4.8 4.1  CL 100* 99*  CO2 29 28  BUN 26* 23*  CREATININE 1.21 1.16  GLUCOSE 149* 170*  CALCIUM 8.5* 8.4*   No results for input(s): LABPT, INR in the last 72 hours.  EXAM General - Patient is Alert, Appropriate and Oriented Extremity - Neurologically intact Neurovascular intact No cellulitis present Compartment soft Dressing/Incision - clean, dry, no drainage Motor Function - intact, moving foot and toes well on exam.   Past Medical History  Diagnosis Date  . Hypertension   . Diabetes mellitus   . Hyperlipidemia   . Obstructive sleep apnea     CPAP  . Peripheral arterial disease ([place])   . Venous insufficiency   . Venous stasis ulcer ([place])   . DOE (dyspnea on exertion)     WITH MINIMAL EXERTION  . HA (headache)     WAKE UP FREQUENTLY WITH HA  . History of Doppler ultrasound 12/2012    LE  VENOUS REFLUX demostrated on L (vein stripping on R w/MRSA); h/o venous stasis bilat   . Atrial fibrillation ([place])   . Heart murmur   . Dysrhythmia   . History of urinary tract infection   . Urinary incontinence   . Arthritis   . Numbness and tingling     feet bilat   . Falls     Assessment/Plan: 7 Days Post-Op Procedure(s) (LRB): TOTAL LEFT  KNEE ARTHROPLASTY (Left) Principal Problem:   Acute respiratory failure with hypoxia (HCC) Active Problems:   Essential hypertension   Obstructive sleep apnea   Venous stasis ulcers of both lower extremities   Hypertensive urgency   S/P left knee arthroscopy   DM (diabetes mellitus) type II controlled peripheral vascular disorder (HCC)   Morbid obesity due to excess calories (HCC)   Bradycardia with 41 - 50 beats per minute   Sepsis, unspecified organism [facility])   Pulmonary vascular congestion   Postoperative anemia due to acute blood loss   Aspiration pneumonitis (HCC)   Up with therapy  DVT Prophylaxis - Eliquis Weight-Bearing as tolerated to left leg  Jeyson Deshotel V 06/07/2015, 11:46 AM

## 2015-06-07 NOTE — Progress Notes (Signed)
Physical Therapy Treatment Patient Details Name: Mario Mills MRN: [id number] DOB: [birth year] Today's Date: 06/07/2015    History of Present Illness Pt is a 65 year old male s/p L TKA with post op acute respiratory failure with hypoxia initially requiring BiPAP, PNA and hypertensive urgency.  PMHx HTN, DM, PAD, OSA, obesity    PT Comments    Slowly progressing with mobility, pt ambulated 44' with RW and performed TKA exercises with assist. +2 assist to stand from recliner.   Follow Up Recommendations  SNF     Equipment Recommendations  Rolling walker with 5" wheels    Recommendations for Other Services       Precautions / Restrictions Precautions Precautions: Fall;Knee Precaution Comments: instructed on KI use for ambulation Required Braces or Orthoses: Knee Immobilizer - Left Knee Immobilizer - Left: Discontinue once straight leg raise with < 10 degree lag Restrictions Weight Bearing Restrictions: No Other Position/Activity Restrictions: WBAT    Mobility  Bed Mobility Overal bed mobility: Needs Assistance Bed Mobility: Supine to Sit;Sit to Supine     Supine to sit: Min assist;HOB elevated Sit to supine: Min assist   General bed mobility comments: assist for L LE   Transfers Overall transfer level: Needs assistance Equipment used: Rolling walker (2 wheeled) Transfers: Sit to/from Stand Sit to Stand: +2 physical assistance;+2 safety/equipment;Mod assist         General transfer comment: assist to power up from recliner using momentum, verbal cues hand placement; mildly unsteady initially upon standing requiring min A  Ambulation/Gait Ambulation/Gait assistance: Min assist;+2 safety/equipment Ambulation Distance (Feet): 18 Feet Assistive device: Rolling walker (2 wheeled) Gait Pattern/deviations: Step-to pattern Gait velocity: decreased   General Gait Details: distance limited by pain/fatigue   Stairs            Wheelchair Mobility     Modified Rankin (Stroke Patients Only)       Balance                                    Cognition Arousal/Alertness: Awake/alert Behavior During Therapy: WFL for tasks assessed/performed Overall Cognitive Status: Within Functional Limits for tasks assessed                      Exercises Total Joint Exercises Ankle Circles/Pumps: AROM;Both;10 reps Quad Sets: AROM;Both;10 reps Short Arc Quad: AROM;Left;20 reps;Supine Heel Slides: AAROM;Left;15 reps;Supine Hip ABduction/ADduction: AAROM;Left;10 reps Straight Leg Raises: AAROM;Left;10 reps;Supine Goniometric ROM: 10-50* AAROM L knee    General Comments        Pertinent Vitals/Pain Pain Score: 7  Pain Location: L knee with activity Pain Descriptors / Indicators: Sore Pain Intervention(s): Monitored during session;Premedicated before session;Limited activity within patient's tolerance;Ice applied    Home Living                      Prior Function            PT Goals (current goals can now be found in the care plan section) Acute Rehab PT Goals PT Goal Formulation: With patient Time For Goal Achievement: 06/05/15 Potential to Achieve Goals: Good Progress towards PT goals: Progressing toward goals    Frequency  7X/week    PT Plan Current plan remains appropriate    Co-evaluation             End of Session Equipment Utilized During Treatment: Gait belt;Left knee immobilizer  Activity Tolerance: Patient tolerated treatment well;Patient limited by fatigue Patient left: with call bell/phone within reach;in bed;with family/visitor present     Time: [id number] PT Time Calculation (min) (ACUTE ONLY): 41 min  Charges:  $Gait Training: 8-22 mins $Therapeutic Exercise: 8-22 mins $Therapeutic Activity: 8-22 mins                    G Codes:      Philomena Doheny 06/07/2015, 2:54 PM [phone]

## 2015-06-07 NOTE — Progress Notes (Signed)
Mario Mills [id number] DOB: 11-14-1950 DOA: 05/31/2015 PCP: Philis Fendt, MD  Summary&Daily Progress Notes 06/05/15: I have seen and examined Mario Mills at bedside, reviewed his chart and discussed with orthopedics. Appreciate cardiology/orthopedics. 65 y.o. year old male with end stage OA of his left knee with progressively worsening pain and dysfunction, admitted for left Total Knee Arthroplasty. Post op hypoxic and placed on BiPAP, TRH consulted to evaluate post op hypoxia. He may have aspirated, staph aureus swab positive earlier in the admission. His white count is going up after transitioning to Levaquin. We'll therefore add back vancomycin and give Flagyl and reassess. He feels better. 06/06/15: Appreciate orthopedics/cardiology. Patient continued to have fever last night and her white count has increased. He may still have residual aspiration pneumonitis. Will change antibiotics to Zosyn only and reassess. If patient continues to have fever and white count continues to increase, would then look to perform further investigations for the possibilities of infection. Meanwhile, will repeat UA/urine culture/chest x-ray. Patient drowsy this afternoon. 06/07/15: Appreciate cardiology/orthopedics. Patient had fever last night but this seems to have subsided. UA unremarkable and CXR shows "1. Interval clearing of interstitial and airspace disease. 2. No acute cardiopulmonary disease". Patient more with it today and denies any complaints. Patient likely has partially treated infection- ?aspiration pneumonitis and/or other sources. Will continue Zosyn and consider further imaging/ID if fever persists, also obtain sputum culture.  Problem List Plan  Principal Problem:   Acute respiratory failure with hypoxia (HCC) Active Problems:   Essential hypertension   Obstructive sleep apnea   Venous stasis ulcers of both lower extremities   Hypertensive urgency   S/P left knee arthroscopy   DM (diabetes  mellitus) type II controlled peripheral vascular disorder (HCC)   Morbid obesity due to excess calories (HCC)   Bradycardia with 41 - 50 beats per minute   Sepsis, unspecified organism [facility])   Pulmonary vascular congestion   Postoperative anemia due to acute blood loss   Aspiration pneumonitis (HCC)   Sputum culture  Day 2 Zosyn  ID consult/CT chest abdomen/lower extremities if fever persists.   Code Status: Full Code Family Communication: Patient at bedside Disposition Plan: Per ortho   Consultants:  Cardiology  Orthopedics  Procedures:  Left total knee replacement  Antibiotics:  Levaquin>06/06/15  Vancomycin>06/06/15  Flagyl>06/06/15  Zosyn 06/06/15>  HPI/Subjective: Complaints of cough, otherwise feels better.  Objective: Filed Vitals:   06/07/15 1705 06/07/15 2137  BP:  145/55  Pulse: 67 68  Temp:  99.5 F (37.5 C)  Resp:  18    Intake/Output Summary (Last 24 hours) at 06/07/15 2248 Last data filed at 06/07/15 2123  Gross per 24 hour  Intake 1908.34 ml  Output   2125 ml  Net -216.66 ml   Filed Weights   06/05/15 0449 06/06/15 0533 06/07/15 0832  Weight: 152.7 kg (336 lb 10.3 oz) 148.099 kg (326 lb 8 oz) 152.545 kg (336 lb 4.8 oz)    Exam:   General:  Comfortable at rest.  Cardiovascular: S1-S2 normal. No murmurs. Pulse regular.  Respiratory: Good air entry bilaterally. No rhonchi or rales.  Abdomen: Soft and nontender. Normal bowel sounds. No organomegaly.  Musculoskeletal: No pedal edema   Neurological: Intact  Data Reviewed: Basic Metabolic Panel:  Recent Labs Lab 06/03/15 0340 06/04/15 0340 06/05/15 0534 06/06/15 0520 06/07/15 0511  NA 139 139 140 137 138  K 3.5 3.7 4.1 4.8 4.1  CL 99* 99* 99* 100* 99*  CO2 30 29 30 29  28  GLUCOSE 171* 152* 149* 149* 170*  BUN 20 25* 25* 26* 23*  CREATININE 0.98 1.08 1.17 1.21 1.16  CALCIUM 8.4* 8.4* 8.6* 8.5* 8.4*   Liver Function Tests: No results for input(s): AST, ALT, ALKPHOS,  BILITOT, PROT, ALBUMIN in the last 168 hours. No results for input(s): LIPASE, AMYLASE in the last 168 hours. No results for input(s): AMMONIA in the last 168 hours. CBC:  Recent Labs Lab 06/03/15 0340 06/04/15 0340 06/05/15 0534 06/06/15 0520 06/07/15 0511  WBC 14.5* 12.6* 13.3* 13.3* 12.0*  NEUTROABS  --   --   --   --  8.6*  HGB 9.4* 9.5* 9.1* 8.9* 9.1*  HCT 29.9* 30.5* 29.6* 28.6* 29.5*  MCV 92.6 92.7 93.7 91.7 92.8  PLT 275 305 352 334 385   Cardiac Enzymes: No results for input(s): CKTOTAL, CKMB, CKMBINDEX, TROPONINI in the last 168 hours. BNP (last 3 results) No results for input(s): BNP in the last 8760 hours.  ProBNP (last 3 results) No results for input(s): PROBNP in the last 8760 hours.  CBG:  Recent Labs Lab 06/06/15 2122 06/07/15 0737 06/07/15 1128 06/07/15 1644 06/07/15 2136  GLUCAP 212* 171* 136* 202* 201*    Recent Results (from the past 240 hour(s))  MRSA PCR Screening     Status: None   Collection Time: 05/31/15  4:10 PM  Result Value Ref Range Status   MRSA by PCR NEGATIVE NEGATIVE Final    Comment:        The GeneXpert MRSA Assay (FDA approved for NASAL specimens only), is one component of a comprehensive MRSA colonization surveillance program. It is not intended to diagnose MRSA infection nor to guide or monitor treatment for MRSA infections.   Culture, blood (routine x 2) Call MD if unable to obtain prior to antibiotics being given     Status: None   Collection Time: 05/31/15  5:10 PM  Result Value Ref Range Status   Specimen Description RIGHT ANTECUBITAL  Final   Special Requests BOTTLES DRAWN AEROBIC AND ANAEROBIC 10CC  Final   Culture   Final    NO GROWTH 5 DAYS Performed at [facility]    Report Status 06/05/2015 FINAL  Final  Culture, blood (routine x 2) Call MD if unable to obtain prior to antibiotics being given     Status: None   Collection Time: 05/31/15  5:22 PM  Result Value Ref Range Status   Specimen  Description BLOOD RIGHT ARM  Final   Special Requests BOTTLES DRAWN AEROBIC AND ANAEROBIC 10CC  Final   Culture   Final    NO GROWTH 5 DAYS Performed at [facility]    Report Status 06/05/2015 FINAL  Final  Culture, Urine     Status: None   Collection Time: 06/06/15  4:16 PM  Result Value Ref Range Status   Specimen Description URINE, CLEAN CATCH  Final   Special Requests NONE  Final   Culture   Final    NO GROWTH 1 DAY Performed at [facility]    Report Status 06/07/2015 FINAL  Final     Studies: Dg Chest Port 1 View  06/06/2015  CLINICAL DATA:  Shortness of breath following knee replacement 05/31/2015. EXAM: PORTABLE CHEST - 1 VIEW COMPARISON:  CTA chest 06/02/2015.  One-view chest x-ray 05/31/2015 FINDINGS: Low lung volumes exaggerate the heart size and interstitium. There is no significant edema or effusion. There significant clearing of interstitial and airspace disease since the prior exam. No focal  airspace disease is present. The visualized soft tissues and bony thorax are unremarkable. IMPRESSION: 1. Interval clearing of interstitial and airspace disease. 2. No acute cardiopulmonary disease. Electronically Signed   By: San Morelle M.D.   On: 06/06/2015 14:31    Scheduled Meds: . antiseptic oral rinse  7 mL Mouth Rinse q12n4p  . apixaban  2.5 mg Oral Q12H  . atorvastatin  20 mg Oral QHS  . carvedilol  12.5 mg Oral BID WC  . chlorhexidine  15 mL Mouth Rinse BID  . diazepam  5 mg Oral BID  . diltiazem  360 mg Oral Daily  . docusate sodium  100 mg Oral BID  . furosemide  40 mg Intravenous BID  . gabapentin  300 mg Oral BID  . lisinopril  20 mg Oral Daily   And  . hydrochlorothiazide  12.5 mg Oral Daily  . insulin aspart  0-15 Units Subcutaneous TID WC  . insulin detemir  20 Units Subcutaneous QHS  . isosorbide-hydrALAZINE  2 tablet Oral TID  . oxybutynin  5 mg Oral BID  . pantoprazole  40 mg Oral Q1200  . piperacillin-tazobactam (ZOSYN)  IV   3.375 g Intravenous 3 times per day   Continuous Infusions: . 0.9 % NaCl with KCl 20 mEq / L 50 mL/hr at 06/06/15 2200     Time spent: 25 minutes    Kareem Cathey  [facility] Pager [phone]. If 7PM-7AM, please contact night-coverage at [email or web address], password [facility] 06/07/2015, 10:48 PM  LOS: 7 days

## 2015-06-08 LAB — BASIC METABOLIC PANEL
Anion gap: 10 (ref 5–15)
BUN: 21 mg/dL — ABNORMAL HIGH (ref 6–20)
CHLORIDE: 100 mmol/L — AB (ref 101–111)
CO2: 27 mmol/L (ref 22–32)
Calcium: 8.4 mg/dL — ABNORMAL LOW (ref 8.9–10.3)
Creatinine, Ser: 1.05 mg/dL (ref 0.61–1.24)
GFR calc non Af Amer: >60 mL/min (ref >60)
Glucose, Bld: 164 mg/dL — ABNORMAL HIGH (ref 65–99)
POTASSIUM: 4 mmol/L (ref 3.5–5.1)
SODIUM: 137 mmol/L (ref 135–145)

## 2015-06-08 LAB — EXPECTORATED SPUTUM ASSESSMENT W REFEX TO RESP CULTURE

## 2015-06-08 LAB — GLUCOSE, CAPILLARY
GLUCOSE-CAPILLARY: 257 mg/dL — AB (ref 65–99)
GLUCOSE-CAPILLARY: 204 mg/dL — AB (ref 65–99)
Glucose-Capillary: 163 mg/dL — ABNORMAL HIGH (ref 65–99)
Glucose-Capillary: 199 mg/dL — ABNORMAL HIGH (ref 65–99)

## 2015-06-08 LAB — CBC
HCT: 28.9 % — ABNORMAL LOW (ref 39.0–52.0)
Hemoglobin: 9 g/dL — ABNORMAL LOW (ref 13.0–17.0)
MCH: 28.1 pg (ref 26.0–34.0)
MCHC: 31.1 g/dL (ref 30.0–36.0)
MCV: 90.3 fL (ref 78.0–100.0)
PLATELETS: 399 10*3/uL (ref 150–400)
RBC: 3.2 MIL/uL — ABNORMAL LOW (ref 4.22–5.81)
RDW: 15.2 % (ref 11.5–15.5)
WBC: 12.2 10*3/uL — AB (ref 4.0–10.5)

## 2015-06-08 MED ORDER — FUROSEMIDE 40 MG PO TABS
40.0000 mg | ORAL_TABLET | Freq: Every day | ORAL | Status: DC
  Administered 2015-06-09 – 2015-06-11 (×3): 40 mg via ORAL
  Filled 2015-06-08 (×3): qty 1

## 2015-06-08 NOTE — Progress Notes (Signed)
Mario Mills [id number] DOB: 10-31-1950 DOA: 05/31/2015 PCP: Philis Fendt, MD  Summary&Daily Progress Notes 06/05/15: I have seen and examined Mario Mills at bedside, reviewed his chart and discussed with orthopedics. Appreciate cardiology/orthopedics. 65 y.o. year old male with end stage OA of his left knee with progressively worsening pain and dysfunction, admitted for left Total Knee Arthroplasty. Post op hypoxic and placed on BiPAP, TRH consulted to evaluate post op hypoxia. He may have aspirated, staph aureus swab positive earlier in the admission. His white count is going up after transitioning to Levaquin. We'll therefore add back vancomycin and give Flagyl and reassess. He feels better. 06/06/15: Appreciate orthopedics/cardiology. Patient continued to have fever last night and her white count has increased. He may still have residual aspiration pneumonitis. Will change antibiotics to Zosyn only and reassess. If patient continues to have fever and white count continues to increase, would then look to perform further investigations for the possibilities of infection. Meanwhile, will repeat UA/urine culture/chest x-ray. Patient drowsy this afternoon. 06/07/15: Appreciate cardiology/orthopedics. Patient had fever last night but this seems to have subsided. UA unremarkable and CXR shows "1. Interval clearing of interstitial and airspace disease. 2. No acute cardiopulmonary disease". Patient more with it today and denies any complaints. Patient likely has partially treated infection- ?aspiration pneumonitis and/or other sources. Will continue Zosyn and consider further imaging/ID if fever persists, also obtain sputum culture.  06/08/15: Fever seems to have subsided. White count is 12,200. Patient feels better. We'll continue Zosyn and follow orthopedics/cardiology recommendations. Problem List Plan  Principal Problem:   Acute respiratory failure with hypoxia (HCC) Active Problems:   Essential  hypertension   Obstructive sleep apnea   Venous stasis ulcers of both lower extremities   Hypertensive urgency   S/P left knee arthroscopy   DM (diabetes mellitus) type II controlled peripheral vascular disorder (HCC)   Morbid obesity due to excess calories (HCC)   Bradycardia with 41 - 50 beats per minute   Sepsis, unspecified organism [facility])   Pulmonary vascular congestion   Postoperative anemia due to acute blood loss   Aspiration pneumonitis (HCC)   Day 3 Zosyn  Image chest/abdomen/lower extremity if fever   Code Status: Full Code Family Communication: Patient at bedside Disposition Plan: Per ortho   Consultants:  Cardiology  Orthopedics  Procedures:  Left total knee replacement  Antibiotics:  Levaquin>06/06/15  Vancomycin>06/06/15  Flagyl>06/06/15  Zosyn 06/06/15>  HPI/Subjective: Feels better. No complaints.  Objective: Filed Vitals:   06/08/15 0428 06/08/15 2047  BP: 145/58 124/87  Pulse: 67 73  Temp: 98.4 F (36.9 C) 97.6 F (36.4 C)  Resp: 20 18    Intake/Output Summary (Last 24 hours) at 06/08/15 2128 Last data filed at 06/08/15 2114  Gross per 24 hour  Intake    100 ml  Output   1650 ml  Net  -1550 ml   Filed Weights   06/06/15 0533 06/07/15 0832 06/08/15 0428  Weight: 148.099 kg (326 lb 8 oz) 152.545 kg (336 lb 4.8 oz) 147.827 kg (325 lb 14.4 oz)    Exam:   General:  Comfortable at rest.  Cardiovascular: S1-S2 normal. No murmurs. Pulse regular.  Respiratory: Good air entry bilaterally. No rhonchi or rales.  Abdomen: Soft and nontender. Normal bowel sounds. No organomegaly.  Musculoskeletal: No pedal edema   Neurological: Intact  Data Reviewed: Basic Metabolic Panel:  Recent Labs Lab 06/04/15 0340 06/05/15 0534 06/06/15 0520 06/07/15 0511 06/08/15 0535  NA 139 140 137 138 137  K  3.7 4.1 4.8 4.1 4.0  CL 99* 99* 100* 99* 100*  CO2 29 30 29 28 27   GLUCOSE 152* 149* 149* 170* 164*  BUN 25* 25* 26* 23* 21*  CREATININE  1.08 1.17 1.21 1.16 1.05  CALCIUM 8.4* 8.6* 8.5* 8.4* 8.4*   Liver Function Tests: No results for input(s): AST, ALT, ALKPHOS, BILITOT, PROT, ALBUMIN in the last 168 hours. No results for input(s): LIPASE, AMYLASE in the last 168 hours. No results for input(s): AMMONIA in the last 168 hours. CBC:  Recent Labs Lab 06/04/15 0340 06/05/15 0534 06/06/15 0520 06/07/15 0511 06/08/15 0726  WBC 12.6* 13.3* 13.3* 12.0* 12.2*  NEUTROABS  --   --   --  8.6*  --   HGB 9.5* 9.1* 8.9* 9.1* 9.0*  HCT 30.5* 29.6* 28.6* 29.5* 28.9*  MCV 92.7 93.7 91.7 92.8 90.3  PLT 305 352 334 385 399   Cardiac Enzymes: No results for input(s): CKTOTAL, CKMB, CKMBINDEX, TROPONINI in the last 168 hours. BNP (last 3 results) No results for input(s): BNP in the last 8760 hours.  ProBNP (last 3 results) No results for input(s): PROBNP in the last 8760 hours.  CBG:  Recent Labs Lab 06/07/15 2136 06/08/15 0738 06/08/15 1153 06/08/15 1633 06/08/15 2042  GLUCAP 201* 163* 257* 199* 204*    Recent Results (from the past 240 hour(s))  MRSA PCR Screening     Status: None   Collection Time: 05/31/15  4:10 PM  Result Value Ref Range Status   MRSA by PCR NEGATIVE NEGATIVE Final    Comment:        The GeneXpert MRSA Assay (FDA approved for NASAL specimens only), is one component of a comprehensive MRSA colonization surveillance program. It is not intended to diagnose MRSA infection nor to guide or monitor treatment for MRSA infections.   Culture, blood (routine x 2) Call MD if unable to obtain prior to antibiotics being given     Status: None   Collection Time: 05/31/15  5:10 PM  Result Value Ref Range Status   Specimen Description RIGHT ANTECUBITAL  Final   Special Requests BOTTLES DRAWN AEROBIC AND ANAEROBIC 10CC  Final   Culture   Final    NO GROWTH 5 DAYS Performed at [facility]    Report Status 06/05/2015 FINAL  Final  Culture, blood (routine x 2) Call MD if unable to obtain prior  to antibiotics being given     Status: None   Collection Time: 05/31/15  5:22 PM  Result Value Ref Range Status   Specimen Description BLOOD RIGHT ARM  Final   Special Requests BOTTLES DRAWN AEROBIC AND ANAEROBIC 10CC  Final   Culture   Final    NO GROWTH 5 DAYS Performed at [facility]    Report Status 06/05/2015 FINAL  Final  Culture, Urine     Status: None   Collection Time: 06/06/15  4:16 PM  Result Value Ref Range Status   Specimen Description URINE, CLEAN CATCH  Final   Special Requests NONE  Final   Culture   Final    NO GROWTH 1 DAY Performed at [facility]    Report Status 06/07/2015 FINAL  Final  Culture, expectorated sputum-assessment     Status: None   Collection Time: 06/08/15  9:05 AM  Result Value Ref Range Status   Specimen Description SPUTUM  Final   Special Requests NONE  Final   Sputum evaluation   Final    MICROSCOPIC FINDINGS  SUGGEST THAT THIS SPECIMEN IS NOT REPRESENTATIVE OF LOWER RESPIRATORY SECRETIONS. PLEASE RECOLLECT. B SHAW AT 0940 ON 01.21.2017 BY NBROOKS    Report Status 06/08/2015 FINAL  Final     Studies: No results found.  Scheduled Meds: . antiseptic oral rinse  7 mL Mouth Rinse q12n4p  . apixaban  2.5 mg Oral Q12H  . atorvastatin  20 mg Oral QHS  . carvedilol  12.5 mg Oral BID WC  . chlorhexidine  15 mL Mouth Rinse BID  . diazepam  5 mg Oral BID  . diltiazem  360 mg Oral Daily  . docusate sodium  100 mg Oral BID  . [START ON 06/09/2015] furosemide  40 mg Oral Daily  . gabapentin  300 mg Oral BID  . lisinopril  20 mg Oral Daily   And  . hydrochlorothiazide  12.5 mg Oral Daily  . insulin aspart  0-15 Units Subcutaneous TID WC  . insulin detemir  20 Units Subcutaneous QHS  . isosorbide-hydrALAZINE  2 tablet Oral TID  . oxybutynin  5 mg Oral BID  . pantoprazole  40 mg Oral Q1200  . piperacillin-tazobactam (ZOSYN)  IV  3.375 g Intravenous 3 times per day   Continuous Infusions:    Time spent: 15  minutes    Kurtis Anastasia  [facility] Pager [phone]. If 7PM-7AM, please contact night-coverage at [email or web address], password [facility] 06/08/2015, 9:28 PM  LOS: 8 days

## 2015-06-08 NOTE — Progress Notes (Signed)
     Subjective: 8 Days Post-Op Procedure(s) (LRB): TOTAL LEFT  KNEE ARTHROPLASTY (Left)   Patient reports pain as mild, pain controlled. Feels that he is doing well with PT and working on the exercises on his own.  He is in very good spirits and joking with staff.  Encouraged to continue to work on there exercises.  Objective:   VITALS:   Filed Vitals:   06/07/15 2235 06/08/15 0428  BP:  145/58  Pulse: 70 67  Temp:  98.4 F (36.9 C)  Resp: 18 20    Dorsiflexion/Plantar flexion intact Incision: dressing C/D/I No cellulitis present Compartment soft  LABS  Recent Labs  06/06/15 0520 06/07/15 0511 06/08/15 0726  HGB 8.9* 9.1* 9.0*  HCT 28.6* 29.5* 28.9*  WBC 13.3* 12.0* 12.2*  PLT 334 385 399     Recent Labs  06/06/15 0520 06/07/15 0511 06/08/15 0535  NA 137 138 137  K 4.8 4.1 4.0  BUN 26* 23* 21*  CREATININE 1.21 1.16 1.05  GLUCOSE 149* 170* 164*     Assessment/Plan: 8 Days Post-Op Procedure(s) (LRB): TOTAL LEFT  KNEE ARTHROPLASTY (Left) Ice to the knee to help with the swelling and inflammation Up with therapy  DVT Prophylaxis - Eliquis Weight-Bearing as tolerated to left leg Plan is to go Rehab after hospital stay      [place] Pugh. Mario Mills   PAC  06/08/2015, 11:06 AM

## 2015-06-08 NOTE — Progress Notes (Signed)
Physical Therapy Treatment Patient Details Name: Mario Mills MRN: [id number] DOB: 1950/07/26 Today's Date: 06/08/2015    History of Present Illness Pt is a 65 year old male s/p L TKA with post op acute respiratory failure with hypoxia initially requiring BiPAP, PNA and hypertensive urgency.  PMHx HTN, DM, PAD, OSA, obesity    PT Comments    Tolerated session well today, limited by fatigue level and trying to encourage patient to try to add little more at each visit.   Follow Up Recommendations  SNF     Equipment Recommendations  Rolling walker with 5" wheels    Recommendations for Other Services       Precautions / Restrictions Precautions Precautions: Fall;Knee Precaution Comments: instructed on KI use for ambulation Required Braces or Orthoses: Knee Immobilizer - Left (pt able to perfomr SLR today, and did not apply KI for ambulation today. ) Knee Immobilizer - Left: Discontinue once straight leg raise with < 10 degree lag Restrictions Weight Bearing Restrictions: No Other Position/Activity Restrictions: WBAT    Mobility  Bed Mobility Overal bed mobility: Needs Assistance Bed Mobility: Supine to Sit;Sit to Supine     Supine to sit: Min assist;HOB elevated     General bed mobility comments: assist for L LE   Transfers Overall transfer level: Needs assistance Equipment used: Rolling walker (2 wheeled) Transfers: Sit to/from Stand Sit to Stand: +2 physical assistance;+2 safety/equipment;Mod assist         General transfer comment: assist to power up from bed using momentum, verbal cues hand placement, bed at elevated level  Ambulation/Gait Ambulation/Gait assistance: Min assist;+2 safety/equipment Ambulation Distance (Feet): 20 Feet Assistive device: Rolling walker (2 wheeled) Gait Pattern/deviations: Step-to pattern Gait velocity: decreased   General Gait Details: distance limited by pain/fatigue, encouraged pt to walk one more time after seated rest  break, he declined.    Stairs            Wheelchair Mobility    Modified Rankin (Stroke Patients Only)       Balance                                    Cognition Arousal/Alertness: Awake/alert Behavior During Therapy: WFL for tasks assessed/performed Overall Cognitive Status: Within Functional Limits for tasks assessed                      Exercises Total Joint Exercises Ankle Circles/Pumps: AROM;Left;5 reps Heel Slides: AAROM;Left;Supine;10 reps Straight Leg Raises: AAROM;Left;10 reps;Supine (encouraged proper breathing with each left and return. ) Long Arc Quad: Seated;Left;10 reps (encourged nee flexion in seated position after knee extension for LAQ. ) Goniometric ROM: 0-60 seated     General Comments        Pertinent Vitals/Pain Pain Score: 6  Pain Location: L knee after activity  Pain Descriptors / Indicators: Sore Pain Intervention(s): Monitored during session;Patient requesting pain meds-RN notified;Ice applied    Home Living                      Prior Function            PT Goals (current goals can now be found in the care plan section) Acute Rehab PT Goals PT Goal Formulation: With patient Time For Goal Achievement: 06/05/15 Potential to Achieve Goals: Good Progress towards PT goals: Progressing toward goals    Frequency  7X/week  PT Plan Current plan remains appropriate    Co-evaluation             End of Session Equipment Utilized During Treatment: Gait belt Activity Tolerance: Patient tolerated treatment well;Patient limited by fatigue Patient left: with call bell/phone within reach;with family/visitor present;in chair (encouraged pt to sit up a little in recliner for he wanted to go back to bed after a few minutes. )     Time: [id number] PT Time Calculation (min) (ACUTE ONLY): 22 min  Charges:  $Gait Training: 8-22 mins $Therapeutic Exercise: 8-22 mins                    G CodesClide Dales 06/26/15, 5:15 PM Clide Dales, PT Pager: [phone] 26-Jun-2015

## 2015-06-08 NOTE — Progress Notes (Signed)
Patient Name: Mario Mills Date of Encounter: 06/08/2015  Principal Problem:   Acute respiratory failure with hypoxia [facility]) Active Problems:   Essential hypertension   Obstructive sleep apnea   Venous stasis ulcers of both lower extremities   Hypertensive urgency   S/P left knee arthroscopy   DM (diabetes mellitus) type II controlled peripheral vascular disorder (HCC)   Morbid obesity due to excess calories (HCC)   Bradycardia with 41 - 50 beats per minute   Sepsis, unspecified organism ([place])   Pulmonary vascular congestion   Postoperative anemia due to acute blood loss   Aspiration pneumonitis ([place])   Length of Stay: 8  SUBJECTIVE  Feels well, no dyspnea, walking with walker. T 99.71F last evening, WBC still high. BP control much improved, albeit not yet perfect. Net 2.6L negative since admission. Weight has been very unreliable with unexplainable wide swings.  CURRENT MEDS . antiseptic oral rinse  7 mL Mouth Rinse q12n4p  . apixaban  2.5 mg Oral Q12H  . atorvastatin  20 mg Oral QHS  . carvedilol  12.5 mg Oral BID WC  . chlorhexidine  15 mL Mouth Rinse BID  . diazepam  5 mg Oral BID  . diltiazem  360 mg Oral Daily  . docusate sodium  100 mg Oral BID  . furosemide  40 mg Oral Daily  . gabapentin  300 mg Oral BID  . lisinopril  20 mg Oral Daily   And  . hydrochlorothiazide  12.5 mg Oral Daily  . insulin aspart  0-15 Units Subcutaneous TID WC  . insulin detemir  20 Units Subcutaneous QHS  . isosorbide-hydrALAZINE  2 tablet Oral TID  . oxybutynin  5 mg Oral BID  . pantoprazole  40 mg Oral Q1200  . piperacillin-tazobactam (ZOSYN)  IV  3.375 g Intravenous 3 times per day    OBJECTIVE   Intake/Output Summary (Last 24 hours) at 06/08/15 1009 Last data filed at 06/08/15 0809  Gross per 24 hour  Intake 776.67 ml  Output   1450 ml  Net -673.33 ml   Filed Weights   06/06/15 0533 06/07/15 0832 06/08/15 0428  Weight: 326 lb 8 oz (148.099 kg) 336 lb 4.8 oz (152.545  kg) 325 lb 14.4 oz (147.827 kg)    PHYSICAL EXAM Filed Vitals:   06/07/15 1705 06/07/15 2137 06/07/15 2235 06/08/15 0428  BP:  145/55  145/58  Pulse: 67 68 70 67  Temp:  99.5 F (37.5 C)  98.4 F (36.9 C)  TempSrc:  Oral  Oral  Resp:  18 18 20   Height:      Weight:    325 lb 14.4 oz (147.827 kg)  SpO2:  95% 92% 97%   General: Alert, oriented x3, no distress Head: no evidence of trauma, PERRL, EOMI, no exophtalmos or lid lag, no myxedema, no xanthelasma; normal ears, nose and oropharynx Neck: normal jugular venous pulsations and no hepatojugular reflux; brisk carotid pulses without delay and no carotid bruits Chest: clear to auscultation, no signs of consolidation by percussion or palpation, normal fremitus, symmetrical and full respiratory excursions Cardiovascular: normal position and quality of the apical impulse, regular rhythm, normal first and second heart sounds, no rubs or gallops, 1-2/6 Ao ejection murmur Abdomen: no tenderness or distention, no masses by palpation, no abnormal pulsatility or arterial bruits, normal bowel sounds, no hepatosplenomegaly Extremities: surgical stitches left knee, no clubbing, cyanosis or edema; 2+ radial, ulnar and brachial pulses bilaterally; 2+ right femoral, posterior tibial and dorsalis pedis pulses;  2+ left femoral, posterior tibial and dorsalis pedis pulses; no subclavian or femoral bruits Neurological: grossly nonfocal  LABS  CBC  Recent Labs  06/07/15 0511 06/08/15 0726  WBC 12.0* 12.2*  NEUTROABS 8.6*  --   HGB 9.1* 9.0*  HCT 29.5* 28.9*  MCV 92.8 90.3  PLT 385 [id number]   Basic Metabolic Panel  Recent Labs  06/07/15 0511 06/08/15 0535  NA 138 137  K 4.1 4.0  CL 99* 100*  CO2 28 27  GLUCOSE 170* 164*  BUN 23* 21*  CREATININE 1.16 1.05  CALCIUM 8.4* 8.4*    Radiology Studies Imaging results have been reviewed and Dg Chest Port 1 View  06/06/2015  CLINICAL DATA:  Shortness of breath following knee replacement  05/31/2015. EXAM: PORTABLE CHEST - 1 VIEW COMPARISON:  CTA chest 06/02/2015.  One-view chest x-ray 05/31/2015 FINDINGS: Low lung volumes exaggerate the heart size and interstitium. There is no significant edema or effusion. There significant clearing of interstitial and airspace disease since the prior exam. No focal airspace disease is present. The visualized soft tissues and bony thorax are unremarkable. IMPRESSION: 1. Interval clearing of interstitial and airspace disease. 2. No acute cardiopulmonary disease. Electronically Signed   By: San Morelle M.D.   On: 06/06/2015 14:31    TELE NSR   ASSESSMENT AND PLAN  Acute respiratory failure with hypoxia (HCC) - appears to be secondary to bilateral PNA, per CXR.  - CT angio done 1/15 without evidence of PE and improvement of airspace processes - pt is on Levaquin, today is day #6 of ABX, fever curve improving  Hypertensive urgency - pt on Coreg 12.5 mg BID, Cardizem 360 mg QD, lasix 40 mg PRN, lisinopril - HCTZ at home  - Coreg was held post op due to bradycardia but was resumed 1/15 - lisinopril - HCTZ was held pre op, was resumed 1/15 - BiDIl added now at BID Increase to TID. BP improved - SBP still slightly high, but do not paln any more changes ind BP meds as inpatient  Pulmonary vascular congestion - last 2 D ECHO 04/2015 with normal EF - clinically at/approaching euvolemia - switched furosemide to PO today  OSA, OHS - CPAP at night time   S/PTOTAL LEFT KNEE ARTHROPLASTY , post op day #7 - per ortho team  - Eliquis for DVT prophylaxis   Mild aortic valve stenosis  Low grade fever and elevated WBC persist despite antibiotics.  Sanda Klein, MD, [facility] [phone] office [phone] pager 06/08/2015 10:09 AM

## 2015-06-08 NOTE — Progress Notes (Signed)
RT placed patient on CPAP. Patient setting is auto 5-20 cmH2O. Sterile water added to water chamber for humidification. Patient is tolerating well. RT will continue to monitor. 

## 2015-06-09 DIAGNOSIS — R0989 Other specified symptoms and signs involving the circulatory and respiratory systems: Secondary | ICD-10-CM

## 2015-06-09 DIAGNOSIS — I5033 Acute on chronic diastolic (congestive) heart failure: Secondary | ICD-10-CM

## 2015-06-09 DIAGNOSIS — E1151 Type 2 diabetes mellitus with diabetic peripheral angiopathy without gangrene: Secondary | ICD-10-CM

## 2015-06-09 DIAGNOSIS — G4733 Obstructive sleep apnea (adult) (pediatric): Secondary | ICD-10-CM

## 2015-06-09 LAB — GLUCOSE, CAPILLARY
GLUCOSE-CAPILLARY: 130 mg/dL — AB (ref 65–99)
GLUCOSE-CAPILLARY: 187 mg/dL — AB (ref 65–99)
Glucose-Capillary: 218 mg/dL — ABNORMAL HIGH (ref 65–99)
Glucose-Capillary: 201 mg/dL — ABNORMAL HIGH (ref 65–99)

## 2015-06-09 LAB — CBC WITH DIFFERENTIAL/PLATELET
BASOS PCT: 0 %
Basophils Absolute: 0 10*3/uL (ref 0.0–0.1)
EOS ABS: 0.3 10*3/uL (ref 0.0–0.7)
Eosinophils Relative: 3 %
HCT: 29 % — ABNORMAL LOW (ref 39.0–52.0)
HEMOGLOBIN: 9 g/dL — AB (ref 13.0–17.0)
Lymphocytes Relative: 20 %
Lymphs Abs: 2.2 10*3/uL (ref 0.7–4.0)
MCH: 28.8 pg (ref 26.0–34.0)
MCHC: 31 g/dL (ref 30.0–36.0)
MCV: 92.9 fL (ref 78.0–100.0)
Monocytes Absolute: 1.1 10*3/uL — ABNORMAL HIGH (ref 0.1–1.0)
Monocytes Relative: 9 %
NEUTROS PCT: 68 %
Neutro Abs: 7.8 10*3/uL — ABNORMAL HIGH (ref 1.7–7.7)
Platelets: 475 10*3/uL — ABNORMAL HIGH (ref 150–400)
RBC: 3.12 MIL/uL — AB (ref 4.22–5.81)
RDW: 15.5 % (ref 11.5–15.5)
WBC: 11.4 10*3/uL — AB (ref 4.0–10.5)

## 2015-06-09 LAB — BASIC METABOLIC PANEL
Anion gap: 9 (ref 5–15)
BUN: 23 mg/dL — ABNORMAL HIGH (ref 6–20)
CHLORIDE: 101 mmol/L (ref 101–111)
CO2: 30 mmol/L (ref 22–32)
CREATININE: 1.14 mg/dL (ref 0.61–1.24)
Calcium: 8.7 mg/dL — ABNORMAL LOW (ref 8.9–10.3)
GFR calc non Af Amer: >60 mL/min (ref >60)
Glucose, Bld: 140 mg/dL — ABNORMAL HIGH (ref 65–99)
POTASSIUM: 3.9 mmol/L (ref 3.5–5.1)
SODIUM: 140 mmol/L (ref 135–145)

## 2015-06-09 NOTE — Progress Notes (Signed)
Subjective: 9 Days Post-Op Procedure(s) (LRB): TOTAL LEFT  KNEE ARTHROPLASTY (Left) Patient reports pain as 1 on 0-10 scale. Wound looks fine. He has no major complaints. He said he is ready to go to [facility]. Cardiology note reviewed.last Temp.100 WBC 11.4.   Objective: Vital signs in last 24 hours: Temp:  [97.6 F (36.4 C)-100 F (37.8 C)] 100 F (37.8 C) (01/22 0502) Pulse Rate:  [67-73] 67 (01/22 0502) Resp:  [18] 18 (01/22 0502) BP: (124-172)/(58-87) 157/58 mmHg (01/22 0507) SpO2:  [96 %-97 %] 96 % (01/22 0502) Weight:  [149.5 kg (329 lb 9.4 oz)] 149.5 kg (329 lb 9.4 oz) (01/22 0502)  Intake/Output from previous day: 01/21 0701 - 01/22 0700 In: 150 [IV Piggyback:150] Out: 2300 [Urine:2300] Intake/Output this shift:     Recent Labs  06/07/15 0511 06/08/15 0726 06/09/15 0607  HGB 9.1* 9.0* 9.0*    Recent Labs  06/08/15 0726 06/09/15 0607  WBC 12.2* 11.4*  RBC 3.20* 3.12*  HCT 28.9* 29.0*  PLT 399 475*    Recent Labs  06/08/15 0535 06/09/15 0607  NA 137 140  K 4.0 3.9  CL 100* 101  CO2 27 30  BUN 21* 23*  CREATININE 1.05 1.14  GLUCOSE 164* 140*  CALCIUM 8.4* 8.7*   No results for input(s): LABPT, INR in the last 72 hours.  No cellulitis present Compartment soft  Assessment/Plan: 9 Days Post-Op Procedure(s) (LRB): TOTAL LEFT  KNEE ARTHROPLASTY (Left) Up with therapy  Leniyah Martell A 06/09/2015, 10:07 AM

## 2015-06-09 NOTE — Progress Notes (Signed)
Patient Name: Mario Mills Date of Encounter: 06/09/2015  Principal Problem:   Acute respiratory failure with hypoxia [facility]) Active Problems:   Essential hypertension   Obstructive sleep apnea   Venous stasis ulcers of both lower extremities   Hypertensive urgency   S/P left knee arthroscopy   DM (diabetes mellitus) type II controlled peripheral vascular disorder (HCC)   Morbid obesity due to excess calories (HCC)   Bradycardia with 41 - 50 beats per minute   Sepsis, unspecified organism ([place])   Pulmonary vascular congestion   Postoperative anemia due to acute blood loss   Aspiration pneumonitis ([place])   Length of Stay: 9  SUBJECTIVE  Feels well. Still low grade fever (100F), slight improvement in WBC. Still had substantial net diuresis after switching to PO diuretics. Now -4.5L since admission. Today's weight looks more realistic, yesterday was inaccurate. Unsure what his dry weight is. No cardiac complaints.  CURRENT MEDS . antiseptic oral rinse  7 mL Mouth Rinse q12n4p  . apixaban  2.5 mg Oral Q12H  . atorvastatin  20 mg Oral QHS  . carvedilol  12.5 mg Oral BID WC  . chlorhexidine  15 mL Mouth Rinse BID  . diazepam  5 mg Oral BID  . diltiazem  360 mg Oral Daily  . docusate sodium  100 mg Oral BID  . furosemide  40 mg Oral Daily  . gabapentin  300 mg Oral BID  . lisinopril  20 mg Oral Daily   And  . hydrochlorothiazide  12.5 mg Oral Daily  . insulin aspart  0-15 Units Subcutaneous TID WC  . insulin detemir  20 Units Subcutaneous QHS  . isosorbide-hydrALAZINE  2 tablet Oral TID  . oxybutynin  5 mg Oral BID  . pantoprazole  40 mg Oral Q1200  . piperacillin-tazobactam (ZOSYN)  IV  3.375 g Intravenous 3 times per day    OBJECTIVE   Intake/Output Summary (Last 24 hours) at 06/09/15 0902 Last data filed at 06/09/15 0603  Gross per 24 hour  Intake    150 ml  Output   2000 ml  Net  -1850 ml   Filed Weights   06/07/15 0832 06/08/15 0428 06/09/15 0502   Weight: 336 lb 4.8 oz (152.545 kg) 325 lb 14.4 oz (147.827 kg) 329 lb 9.4 oz (149.5 kg)    PHYSICAL EXAM Filed Vitals:   06/08/15 0428 06/08/15 2047 06/09/15 0502 06/09/15 0507  BP: 145/58 124/87 172/73 157/58  Pulse: 67 73 67   Temp: 98.4 F (36.9 C) 97.6 F (36.4 C) 100 F (37.8 C)   TempSrc: Oral Oral Oral   Resp: 20 18 18    Height:      Weight: 325 lb 14.4 oz (147.827 kg)  329 lb 9.4 oz (149.5 kg)   SpO2: 97% 97% 96%    General: Alert, oriented x3, no distress Head: no evidence of trauma, PERRL, EOMI, no exophtalmos or lid lag, no myxedema, no xanthelasma; normal ears, nose and oropharynx Neck: normal jugular venous pulsations and no hepatojugular reflux; brisk carotid pulses without delay and no carotid bruits Chest: clear to auscultation, no signs of consolidation by percussion or palpation, normal fremitus, symmetrical and full respiratory excursions Cardiovascular: normal position and quality of the apical impulse, regular rhythm, normal first and second heart sounds, no rubs or gallops, 1-2/6 Ao ejection murmur Abdomen: no tenderness or distention, no masses by palpation, no abnormal pulsatility or arterial bruits, normal bowel sounds, no hepatosplenomegaly Extremities: no clubbing, cyanosis or edema; 2+ radial,  ulnar and brachial pulses bilaterally; 2+ right femoral, posterior tibial and dorsalis pedis pulses; 2+ left femoral, posterior tibial and dorsalis pedis pulses; no subclavian or femoral bruits Neurological: grossly nonfocal  LABS  CBC  Recent Labs  06/07/15 0511 06/08/15 0726 06/09/15 0607  WBC 12.0* 12.2* 11.4*  NEUTROABS 8.6*  --  7.8*  HGB 9.1* 9.0* 9.0*  HCT 29.5* 28.9* 29.0*  MCV 92.8 90.3 92.9  PLT 385 399 [id number]*   Basic Metabolic Panel  Recent Labs  06/08/15 0535 06/09/15 0607  NA 137 140  K 4.0 3.9  CL 100* 101  CO2 27 30  GLUCOSE 164* 140*  BUN 21* 23*  CREATININE 1.05 1.14  CALCIUM 8.4* 8.7*    Radiology Studies Imaging results  have been reviewed and No results found.  TELE NSR    ASSESSMENT AND PLAN  Acute respiratory failure with hypoxia (HCC) - appears to be secondary to bilateral PNA, per CXR.   Hypertensive urgency - SBP still slightly high at times, but do not plan any more changes ind BP meds as inpatient  Pulmonary vascular congestion/acute on chronic diastolic HF - last 2 D ECHO 04/2015 with normal EF - clinically at/approaching euvolemia, renal parameters at baseline - switched furosemide to PO (on his home dose)  OSA, OHS - CPAP at night time   S/P TOTAL LEFT KNEE ARTHROPLASTY , post op day #8 - per ortho team  - Eliquis for DVT prophylaxis   Mild aortic valve stenosis   Sanda Klein, MD, [facility] [phone] office [phone] pager 06/09/2015 9:02 AM

## 2015-06-09 NOTE — Progress Notes (Addendum)
Mario Mills [id number] DOB: 1951/03/27 DOA: 05/31/2015 PCP: Philis Fendt, MD  Summary&Daily Progress Notes since admission 06/05/15: I have seen and examined Mario Mills at bedside, reviewed his chart and discussed with orthopedics. Appreciate cardiology/orthopedics. 65 y.o. year old male with end stage OA of his left knee with progressively worsening pain and dysfunction, admitted for left Total Knee Arthroplasty. Post op hypoxic and placed on BiPAP, TRH consulted to evaluate post op hypoxia. He may have aspirated, staph aureus swab positive earlier in the admission. His white count is going up after transitioning to Levaquin. We'll therefore add back vancomycin and give Flagyl and reassess. He feels better. 06/06/15: Appreciate orthopedics/cardiology. Patient continued to have fever last night and her white count has increased. He may still have residual aspiration pneumonitis. Will change antibiotics to Zosyn only and reassess. If patient continues to have fever and white count continues to increase, would then look to perform further investigations for the possibilities of infection. Meanwhile, will repeat UA/urine culture/chest x-ray. Patient drowsy this afternoon. 06/07/15: Appreciate cardiology/orthopedics. Patient had fever last night but this seems to have subsided. UA unremarkable and CXR shows "1. Interval clearing of interstitial and airspace disease. 2. No acute cardiopulmonary disease". Patient more with it today and denies any complaints. Patient likely has partially treated infection- ?aspiration pneumonitis and/or other sources. Will continue Zosyn and consider further imaging/ID if fever persists, also obtain sputum culture.  06/08/15: Fever seems to have subsided. White count is 12,200. Patient feels better. We'll continue Zosyn and follow orthopedics/cardiology recommendations. 06/09/15: White count improved to 11,400. No more fever. Patient feels better. Would continue Zosyn until  tomorrow. Continue rest of management per orthopedics/cardiology. Problem List Plan  Principal Problem:   Acute respiratory failure with hypoxia (HCC) Active Problems:   Essential hypertension   Obstructive sleep apnea   Venous stasis ulcers of both lower extremities   Hypertensive urgency   S/P left knee arthroscopy   DM (diabetes mellitus) type II controlled peripheral vascular disorder (HCC)   Morbid obesity due to excess calories (HCC)   Bradycardia with 41 - 50 beats per minute   Sepsis, unspecified organism [facility])   Pulmonary vascular congestion   Postoperative anemia due to acute blood loss   Aspiration pneumonitis ([place])   Continue Zosyn   Code Status: Full Code Family Communication: Patient at bedside Disposition Plan: Per ortho   Consultants:  Cardiology  Orthopedics  Procedures:  Left total knee replacement  Antibiotics:  Levaquin>06/06/15  Vancomycin>06/06/15  Flagyl>06/06/15  Zosyn 06/06/15>  HPI/Subjective: Feels better. No complaints.  Objective: Filed Vitals:   06/09/15 0507 06/09/15 1357  BP: 157/58 147/57  Pulse:  66  Temp:  99.2 F (37.3 C)  Resp:  24    Intake/Output Summary (Last 24 hours) at 06/09/15 2012 Last data filed at 06/09/15 1854  Gross per 24 hour  Intake    630 ml  Output   1100 ml  Net   -470 ml   Filed Weights   06/07/15 0832 06/08/15 0428 06/09/15 0502  Weight: 152.545 kg (336 lb 4.8 oz) 147.827 kg (325 lb 14.4 oz) 149.5 kg (329 lb 9.4 oz)    Exam:   General:  Comfortable at rest.  Cardiovascular: S1-S2 normal. No murmurs. Pulse regular.  Respiratory: Good air entry bilaterally. No rhonchi or rales.  Abdomen: Soft and nontender. Normal bowel sounds. No organomegaly.  Musculoskeletal: No pedal edema   Neurological: Intact  Data Reviewed: Basic Metabolic Panel:  Recent Labs Lab 06/05/15 0534 06/06/15  [id number] 06/07/15 0511 06/08/15 0535 06/09/15 0607  NA 140 137 138 137 140  K 4.1 4.8 4.1 4.0 3.9   CL 99* 100* 99* 100* 101  CO2 30 29 28 27 30   GLUCOSE 149* 149* 170* 164* 140*  BUN 25* 26* 23* 21* 23*  CREATININE 1.17 1.21 1.16 1.05 1.14  CALCIUM 8.6* 8.5* 8.4* 8.4* 8.7*   Liver Function Tests: No results for input(s): AST, ALT, ALKPHOS, BILITOT, PROT, ALBUMIN in the last 168 hours. No results for input(s): LIPASE, AMYLASE in the last 168 hours. No results for input(s): AMMONIA in the last 168 hours. CBC:  Recent Labs Lab 06/05/15 0534 06/06/15 0520 06/07/15 0511 06/08/15 0726 06/09/15 0607  WBC 13.3* 13.3* 12.0* 12.2* 11.4*  NEUTROABS  --   --  8.6*  --  7.8*  HGB 9.1* 8.9* 9.1* 9.0* 9.0*  HCT 29.6* 28.6* 29.5* 28.9* 29.0*  MCV 93.7 91.7 92.8 90.3 92.9  PLT 352 334 385 399 475*   Cardiac Enzymes: No results for input(s): CKTOTAL, CKMB, CKMBINDEX, TROPONINI in the last 168 hours. BNP (last 3 results) No results for input(s): BNP in the last 8760 hours.  ProBNP (last 3 results) No results for input(s): PROBNP in the last 8760 hours.  CBG:  Recent Labs Lab 06/08/15 1633 06/08/15 2042 06/09/15 0749 06/09/15 1214 06/09/15 1701  GLUCAP 199* 204* 130* 187* 218*    Recent Results (from the past 240 hour(s))  MRSA PCR Screening     Status: None   Collection Time: 05/31/15  4:10 PM  Result Value Ref Range Status   MRSA by PCR NEGATIVE NEGATIVE Final    Comment:        The GeneXpert MRSA Assay (FDA approved for NASAL specimens only), is one component of a comprehensive MRSA colonization surveillance program. It is not intended to diagnose MRSA infection nor to guide or monitor treatment for MRSA infections.   Culture, blood (routine x 2) Call MD if unable to obtain prior to antibiotics being given     Status: None   Collection Time: 05/31/15  5:10 PM  Result Value Ref Range Status   Specimen Description RIGHT ANTECUBITAL  Final   Special Requests BOTTLES DRAWN AEROBIC AND ANAEROBIC 10CC  Final   Culture   Final    NO GROWTH 5 DAYS Performed at [facility]    Report Status 06/05/2015 FINAL  Final  Culture, blood (routine x 2) Call MD if unable to obtain prior to antibiotics being given     Status: None   Collection Time: 05/31/15  5:22 PM  Result Value Ref Range Status   Specimen Description BLOOD RIGHT ARM  Final   Special Requests BOTTLES DRAWN AEROBIC AND ANAEROBIC 10CC  Final   Culture   Final    NO GROWTH 5 DAYS Performed at [facility]    Report Status 06/05/2015 FINAL  Final  Culture, Urine     Status: None   Collection Time: 06/06/15  4:16 PM  Result Value Ref Range Status   Specimen Description URINE, CLEAN CATCH  Final   Special Requests NONE  Final   Culture   Final    NO GROWTH 1 DAY Performed at [facility]    Report Status 06/07/2015 FINAL  Final  Culture, expectorated sputum-assessment     Status: None   Collection Time: 06/08/15  9:05 AM  Result Value Ref Range Status   Specimen Description SPUTUM  Final   Special Requests NONE  Final   Sputum evaluation   Final    MICROSCOPIC FINDINGS SUGGEST THAT THIS SPECIMEN IS NOT REPRESENTATIVE OF LOWER RESPIRATORY SECRETIONS. PLEASE RECOLLECT. B SHAW AT 0940 ON 01.21.2017 BY NBROOKS    Report Status 06/08/2015 FINAL  Final     Studies: No results found.  Scheduled Meds: . antiseptic oral rinse  7 mL Mouth Rinse q12n4p  . apixaban  2.5 mg Oral Q12H  . atorvastatin  20 mg Oral QHS  . carvedilol  12.5 mg Oral BID WC  . chlorhexidine  15 mL Mouth Rinse BID  . diazepam  5 mg Oral BID  . diltiazem  360 mg Oral Daily  . docusate sodium  100 mg Oral BID  . furosemide  40 mg Oral Daily  . gabapentin  300 mg Oral BID  . lisinopril  20 mg Oral Daily   And  . hydrochlorothiazide  12.5 mg Oral Daily  . insulin aspart  0-15 Units Subcutaneous TID WC  . insulin detemir  20 Units Subcutaneous QHS  . isosorbide-hydrALAZINE  2 tablet Oral TID  . oxybutynin  5 mg Oral BID  . pantoprazole  40 mg Oral Q1200  . piperacillin-tazobactam (ZOSYN)  IV   3.375 g Intravenous 3 times per day   Continuous Infusions:    Time spent: 25 minutes    Teaghan Melrose  [facility] Pager [phone]. If 7PM-7AM, please contact night-coverage at [email or web address], password [facility] 06/09/2015, 8:12 PM  LOS: 9 days

## 2015-06-10 DIAGNOSIS — I5033 Acute on chronic diastolic (congestive) heart failure: Secondary | ICD-10-CM | POA: Insufficient documentation

## 2015-06-10 LAB — GLUCOSE, CAPILLARY
GLUCOSE-CAPILLARY: 137 mg/dL — AB (ref 65–99)
GLUCOSE-CAPILLARY: 218 mg/dL — AB (ref 65–99)
Glucose-Capillary: 221 mg/dL — ABNORMAL HIGH (ref 65–99)
Glucose-Capillary: 196 mg/dL — ABNORMAL HIGH (ref 65–99)

## 2015-06-10 LAB — BASIC METABOLIC PANEL
Anion gap: 9 (ref 5–15)
BUN: 20 mg/dL (ref 6–20)
CHLORIDE: 102 mmol/L (ref 101–111)
CO2: 30 mmol/L (ref 22–32)
CREATININE: 1.21 mg/dL (ref 0.61–1.24)
Calcium: 8.9 mg/dL (ref 8.9–10.3)
Glucose, Bld: 143 mg/dL — ABNORMAL HIGH (ref 65–99)
POTASSIUM: 4.2 mmol/L (ref 3.5–5.1)
SODIUM: 141 mmol/L (ref 135–145)

## 2015-06-10 LAB — CBC
HCT: 30.1 % — ABNORMAL LOW (ref 39.0–52.0)
HEMOGLOBIN: 9.3 g/dL — AB (ref 13.0–17.0)
MCH: 28.8 pg (ref 26.0–34.0)
MCHC: 30.9 g/dL (ref 30.0–36.0)
MCV: 93.2 fL (ref 78.0–100.0)
PLATELETS: 515 10*3/uL — AB (ref 150–400)
RBC: 3.23 MIL/uL — AB (ref 4.22–5.81)
RDW: 15.3 % (ref 11.5–15.5)
WBC: 11 10*3/uL — ABNORMAL HIGH (ref 4.0–10.5)

## 2015-06-10 NOTE — Progress Notes (Signed)
Mario Mills [id number] DOB: Sep 14, 1950 DOA: 05/31/2015 PCP: Philis Fendt, MD  Summary&Daily Progress Notes since admission 06/05/15: I have seen and examined Mr. Honan at bedside, reviewed his chart and discussed with orthopedics. Appreciate cardiology/orthopedics. 65 y.o. year old male with end stage OA of his left knee with progressively worsening pain and dysfunction, admitted for left Total Knee Arthroplasty. Post op hypoxic and placed on BiPAP, TRH consulted to evaluate post op hypoxia. He may have aspirated, staph aureus swab positive earlier in the admission. His white count is going up after transitioning to Levaquin. We'll therefore add back vancomycin and give Flagyl and reassess. He feels better. 06/06/15: Appreciate orthopedics/cardiology. Patient continued to have fever last night and her white count has increased. He may still have residual aspiration pneumonitis. Will change antibiotics to Zosyn only and reassess. If patient continues to have fever and white count continues to increase, would then look to perform further investigations for the possibilities of infection. Meanwhile, will repeat UA/urine culture/chest x-ray. Patient drowsy this afternoon. 06/07/15: Appreciate cardiology/orthopedics. Patient had fever last night but this seems to have subsided. UA unremarkable and CXR shows "1. Interval clearing of interstitial and airspace disease. 2. No acute cardiopulmonary disease". Patient more with it today and denies any complaints. Patient likely has partially treated infection- ?aspiration pneumonitis and/or other sources. Will continue Zosyn and consider further imaging/ID if fever persists, also obtain sputum culture.  06/08/15: Fever seems to have subsided. White count is 12,200. Patient feels better. We'll continue Zosyn and follow orthopedics/cardiology recommendations. 06/09/15: White count improved to 11,400. No more fever. Patient feels better. Would continue Zosyn until  tomorrow. Continue rest of management per orthopedics/cardiology. 06/10/15: White count improved. Had BM today. Will d/c Zosyn. ?SNF today. Problem List Plan  Principal Problem:   Acute respiratory failure with hypoxia (HCC) Active Problems:   Essential hypertension   Obstructive sleep apnea   Venous stasis ulcers of both lower extremities   Hypertensive urgency   S/P left knee arthroscopy   DM (diabetes mellitus) type II controlled peripheral vascular disorder (HCC)   Morbid obesity due to excess calories (HCC)   Bradycardia with 41 - 50 beats per minute   Sepsis, unspecified organism [facility])   Pulmonary vascular congestion   Postoperative anemia due to acute blood loss   Aspiration pneumonitis ([place])   D/c Zosyn, otherwise continue current management.  Code Status: Full Code Family Communication: Patient at bedside Disposition Plan: Per ortho   Consultants:  Cardiology  Orthopedics  Procedures:  Left total knee replacement  Antibiotics:  Levaquin>06/06/15  Vancomycin>06/06/15  Flagyl>06/06/15  Zosyn 06/06/15>06/10/15  HPI/Subjective: Feels "so, so"  Objective: Filed Vitals:   06/09/15 2107 06/10/15 0552  BP: 151/61 164/70  Pulse: 68 62  Temp: 98.7 F (37.1 C) 98 F (36.7 C)  Resp: 22 22    Intake/Output Summary (Last 24 hours) at 06/10/15 1145 Last data filed at 06/10/15 0554  Gross per 24 hour  Intake    870 ml  Output   1650 ml  Net   -780 ml   Filed Weights   06/08/15 0428 06/09/15 0502 06/10/15 0552  Weight: 147.827 kg (325 lb 14.4 oz) 149.5 kg (329 lb 9.4 oz) 149.279 kg (329 lb 1.6 oz)    Exam:   General:  Comfortable at rest.  Cardiovascular: S1-S2 normal. No murmurs. Pulse regular.  Respiratory: Good air entry bilaterally. No rhonchi or rales.  Abdomen: Soft and nontender. Normal bowel sounds. No organomegaly.  Musculoskeletal: No pedal edema  Neurological: Intact  Data Reviewed: Basic Metabolic Panel:  Recent Labs Lab  06/06/15 0520 06/07/15 0511 06/08/15 0535 06/09/15 0607 06/10/15 0645  NA 137 138 137 140 141  K 4.8 4.1 4.0 3.9 4.2  CL 100* 99* 100* 101 102  CO2 29 28 27 30 30   GLUCOSE 149* 170* 164* 140* 143*  BUN 26* 23* 21* 23* 20  CREATININE 1.21 1.16 1.05 1.14 1.21  CALCIUM 8.5* 8.4* 8.4* 8.7* 8.9   Liver Function Tests: No results for input(s): AST, ALT, ALKPHOS, BILITOT, PROT, ALBUMIN in the last 168 hours. No results for input(s): LIPASE, AMYLASE in the last 168 hours. No results for input(s): AMMONIA in the last 168 hours. CBC:  Recent Labs Lab 06/06/15 0520 06/07/15 0511 06/08/15 0726 06/09/15 0607 06/10/15 0645  WBC 13.3* 12.0* 12.2* 11.4* 11.0*  NEUTROABS  --  8.6*  --  7.8*  --   HGB 8.9* 9.1* 9.0* 9.0* 9.3*  HCT 28.6* 29.5* 28.9* 29.0* 30.1*  MCV 91.7 92.8 90.3 92.9 93.2  PLT 334 385 399 475* 515*   Cardiac Enzymes: No results for input(s): CKTOTAL, CKMB, CKMBINDEX, TROPONINI in the last 168 hours. BNP (last 3 results) No results for input(s): BNP in the last 8760 hours.  ProBNP (last 3 results) No results for input(s): PROBNP in the last 8760 hours.  CBG:  Recent Labs Lab 06/09/15 0749 06/09/15 1214 06/09/15 1701 06/09/15 2106 06/10/15 0732  GLUCAP 130* 187* 218* 201* 137*    Recent Results (from the past 240 hour(s))  MRSA PCR Screening     Status: None   Collection Time: 05/31/15  4:10 PM  Result Value Ref Range Status   MRSA by PCR NEGATIVE NEGATIVE Final    Comment:        The GeneXpert MRSA Assay (FDA approved for NASAL specimens only), is one component of a comprehensive MRSA colonization surveillance program. It is not intended to diagnose MRSA infection nor to guide or monitor treatment for MRSA infections.   Culture, blood (routine x 2) Call MD if unable to obtain prior to antibiotics being given     Status: None   Collection Time: 05/31/15  5:10 PM  Result Value Ref Range Status   Specimen Description RIGHT ANTECUBITAL  Final    Special Requests BOTTLES DRAWN AEROBIC AND ANAEROBIC 10CC  Final   Culture   Final    NO GROWTH 5 DAYS Performed at [facility]    Report Status 06/05/2015 FINAL  Final  Culture, blood (routine x 2) Call MD if unable to obtain prior to antibiotics being given     Status: None   Collection Time: 05/31/15  5:22 PM  Result Value Ref Range Status   Specimen Description BLOOD RIGHT ARM  Final   Special Requests BOTTLES DRAWN AEROBIC AND ANAEROBIC 10CC  Final   Culture   Final    NO GROWTH 5 DAYS Performed at [facility]    Report Status 06/05/2015 FINAL  Final  Culture, Urine     Status: None   Collection Time: 06/06/15  4:16 PM  Result Value Ref Range Status   Specimen Description URINE, CLEAN CATCH  Final   Special Requests NONE  Final   Culture   Final    NO GROWTH 1 DAY Performed at [facility]    Report Status 06/07/2015 FINAL  Final  Culture, expectorated sputum-assessment     Status: None   Collection Time: 06/08/15  9:05 AM  Result Value  Ref Range Status   Specimen Description SPUTUM  Final   Special Requests NONE  Final   Sputum evaluation   Final    MICROSCOPIC FINDINGS SUGGEST THAT THIS SPECIMEN IS NOT REPRESENTATIVE OF LOWER RESPIRATORY SECRETIONS. PLEASE RECOLLECT. B SHAW AT 0940 ON 01.21.2017 BY NBROOKS    Report Status 06/08/2015 FINAL  Final     Studies: No results found.  Scheduled Meds: . antiseptic oral rinse  7 mL Mouth Rinse q12n4p  . apixaban  2.5 mg Oral Q12H  . atorvastatin  20 mg Oral QHS  . carvedilol  12.5 mg Oral BID WC  . chlorhexidine  15 mL Mouth Rinse BID  . diazepam  5 mg Oral BID  . diltiazem  360 mg Oral Daily  . docusate sodium  100 mg Oral BID  . furosemide  40 mg Oral Daily  . gabapentin  300 mg Oral BID  . lisinopril  20 mg Oral Daily   And  . hydrochlorothiazide  12.5 mg Oral Daily  . insulin aspart  0-15 Units Subcutaneous TID WC  . insulin detemir  20 Units Subcutaneous QHS  . isosorbide-hydrALAZINE   2 tablet Oral TID  . oxybutynin  5 mg Oral BID  . pantoprazole  40 mg Oral Q1200   Continuous Infusions:    Time spent: 25 minutes    Ebany Bowermaster  [facility] Pager [phone]. If 7PM-7AM, please contact night-coverage at [email or web address], password [facility] 06/10/2015, 11:45 AM  LOS: 10 days

## 2015-06-10 NOTE — Progress Notes (Signed)
Patient Profile: 65 y.o. year old male with a history of HTN, DM, HL, OSA on CPAP, PAD, afib, was admitted 01/10 for L TKA, post-op resp failure req BiPAP, PNA, HTN. Cards consulted to help w/ HTN and volume.   Subjective: No complaints. Had a BM today, first time since admission. No CP or dyspnea.   Objective: Vital signs in last 24 hours: Temp:  [98 F (36.7 C)-99.2 F (37.3 C)] 98 F (36.7 C) (01/23 0552) Pulse Rate:  [62-68] 62 (01/23 0552) Resp:  [22-24] 22 (01/23 0552) BP: (147-164)/(57-70) 164/70 mmHg (01/23 0552) SpO2:  [94 %-97 %] 95 % (01/23 0552) Weight:  [329 lb 1.6 oz (149.279 kg)] 329 lb 1.6 oz (149.279 kg) (01/23 0552) Last BM Date: 06/06/15  Intake/Output from previous day: 01/22 0701 - 01/23 0700 In: 870 [P.O.:720; IV Piggyback:150] Out: 1951 [Urine:1951] Intake/Output this shift:    Medications Current Facility-Administered Medications  Medication Dose Route Frequency Provider Last Rate Last Dose  . acetaminophen (TYLENOL) tablet 650 mg  650 mg Oral Q6H PRN Gaynelle Arabian, MD   650 mg at 06/06/15 2140   Or  . acetaminophen (TYLENOL) suppository 650 mg  650 mg Rectal Q6H PRN Gaynelle Arabian, MD      . antiseptic oral rinse (CPC / CETYLPYRIDINIUM CHLORIDE 0.05%) solution 7 mL  7 mL Mouth Rinse q12n4p Gaynelle Arabian, MD   7 mL at 06/09/15 1416  . apixaban (ELIQUIS) tablet 2.5 mg  2.5 mg Oral Q12H Gaynelle Arabian, MD   2.5 mg at 06/10/15 [id number]  . atorvastatin (LIPITOR) tablet 20 mg  20 mg Oral QHS Gaynelle Arabian, MD   20 mg at 06/09/15 2205  . bisacodyl (DULCOLAX) suppository 10 mg  10 mg Rectal Daily PRN Gaynelle Arabian, MD   10 mg at 06/05/15 1136  . carvedilol (COREG) tablet 12.5 mg  12.5 mg Oral BID WC Theodis Blaze, MD   12.5 mg at 06/10/15 [id number]  . chlorhexidine (PERIDEX) 0.12 % solution 15 mL  15 mL Mouth Rinse BID Gaynelle Arabian, MD   15 mL at 06/09/15 2205  . diazepam (VALIUM) tablet 5 mg  5 mg Oral BID Gaynelle Arabian, MD   5 mg at 06/09/15 2205  . diltiazem  (CARDIZEM CD) 24 hr capsule 360 mg  360 mg Oral Daily Gaynelle Arabian, MD   360 mg at 06/09/15 0919  . diphenhydrAMINE (BENADRYL) 12.5 MG/5ML elixir 12.5-25 mg  12.5-25 mg Oral Q4H PRN Gaynelle Arabian, MD      . docusate sodium (COLACE) capsule 100 mg  100 mg Oral BID Gaynelle Arabian, MD   100 mg at 06/09/15 2205  . furosemide (LASIX) tablet 40 mg  40 mg Oral Daily Mihai Croitoru, MD   40 mg at 06/09/15 0919  . gabapentin (NEURONTIN) capsule 300 mg  300 mg Oral BID Gaynelle Arabian, MD   300 mg at 06/09/15 2205  . gi cocktail (Maalox,Lidocaine,Donnatal)  30 mL Oral TID PRN Theodis Blaze, MD   30 mL at 06/02/15 1052  . guaiFENesin-dextromethorphan (ROBITUSSIN DM) 100-10 MG/5ML syrup 5 mL  5 mL Oral Q4H PRN Theodis Blaze, MD   5 mL at 06/07/15 1726  . hydrALAZINE (APRESOLINE) injection 10 mg  10 mg Intravenous Q4H PRN Theodis Blaze, MD   10 mg at 06/10/15 0559  . lisinopril (PRINIVIL,ZESTRIL) tablet 20 mg  20 mg Oral Daily Royetta Asal, RPH   20 mg at 06/09/15 [id number]   And  . hydrochlorothiazide (MICROZIDE) capsule  12.5 mg  12.5 mg Oral Daily Royetta Asal, RPH   12.5 mg at 06/09/15 [id number]  . HYDROmorphone (DILAUDID) injection 1 mg  1 mg Intravenous Q2H PRN Theodis Blaze, MD   1 mg at 06/03/15 [id number]  . insulin aspart (novoLOG) injection 0-15 Units  0-15 Units Subcutaneous TID WC Gaynelle Arabian, MD   2 Units at 06/10/15 [phone]  . insulin detemir (LEVEMIR) injection 20 Units  20 Units Subcutaneous QHS Gaynelle Arabian, MD   20 Units at 06/09/15 2206  . isosorbide-hydrALAZINE (BIDIL) 20-37.5 MG per tablet 2 tablet  2 tablet Oral TID Minus Breeding, MD   2 tablet at 06/09/15 2204  . lactulose (CHRONULAC) 10 GM/15ML solution 20 g  20 g Oral BID PRN Simbiso Ranga, MD   20 g at 06/09/15 2007  . levalbuterol (XOPENEX) nebulizer solution 1.25 mg  1.25 mg Nebulization Q4H PRN Theodis Blaze, MD      . menthol-cetylpyridinium (CEPACOL) lozenge 3 mg  1 lozenge Oral PRN Gaynelle Arabian, MD       Or  . phenol (CHLORASEPTIC) mouth  spray 1 spray  1 spray Mouth/Throat PRN Gaynelle Arabian, MD      . methocarbamol (ROBAXIN) tablet 500 mg  500 mg Oral Q6H PRN Gaynelle Arabian, MD   500 mg at 06/09/15 1412   Or  . methocarbamol (ROBAXIN) 500 mg in dextrose 5 % 50 mL IVPB  500 mg Intravenous Q6H PRN Gaynelle Arabian, MD   500 mg at 05/31/15 1423  . metoCLOPramide (REGLAN) tablet 5-10 mg  5-10 mg Oral Q8H PRN Gaynelle Arabian, MD       Or  . metoCLOPramide (REGLAN) injection 5-10 mg  5-10 mg Intravenous Q8H PRN Gaynelle Arabian, MD      . ondansetron [facility]) tablet 4 mg  4 mg Oral Q6H PRN Gaynelle Arabian, MD       Or  . ondansetron [facility]) injection 4 mg  4 mg Intravenous Q6H PRN Gaynelle Arabian, MD      . oxybutynin (DITROPAN) tablet 5 mg  5 mg Oral BID Gaynelle Arabian, MD   5 mg at 06/09/15 2205  . oxyCODONE (Oxy IR/ROXICODONE) immediate release tablet 5-10 mg  5-10 mg Oral Q3H PRN Gaynelle Arabian, MD   10 mg at 06/09/15 2204  . pantoprazole (PROTONIX) EC tablet 40 mg  40 mg Oral Q1200 Simbiso Ranga, MD   40 mg at 06/09/15 1218  . piperacillin-tazobactam (ZOSYN) IVPB 3.375 g  3.375 g Intravenous 3 times per day Thomes Lolling, RPH   3.375 g at 06/10/15 0554  . polyethylene glycol (MIRALAX / GLYCOLAX) packet 17 g  17 g Oral Daily PRN Gaynelle Arabian, MD   17 g at 06/09/15 [id number]  . traMADol (ULTRAM) tablet 50-100 mg  50-100 mg Oral Q6H PRN Gaynelle Arabian, MD        PE: General appearance: alert, cooperative, no distress and moderately obese Neck: no carotid bruit and no JVD Lungs: clear to auscultation bilaterally Heart: regular rate and rhythm, S1, S2 normal, no murmur, click, rub or gallop Extremities: no LEE Pulses: 2+ and symmetric Skin: warm and dry Neurologic: Grossly normal  Lab Results:   Recent Labs  06/08/15 0726 06/09/15 0607 06/10/15 0645  WBC 12.2* 11.4* 11.0*  HGB 9.0* 9.0* 9.3*  HCT 28.9* 29.0* 30.1*  PLT 399 475* 515*   BMET  Recent Labs  06/08/15 0535 06/09/15 0607 06/10/15 0645  NA 137 140 141  K 4.0 3.9  4.2  CL  100* 101 102  CO2 27 30 30   GLUCOSE 164* 140* 143*  BUN 21* 23* 20  CREATININE 1.05 1.14 1.21  CALCIUM 8.4* 8.7* 8.9     Assessment/Plan  Principal Problem:   Acute respiratory failure with hypoxia (HCC) Active Problems:   Essential hypertension   Obstructive sleep apnea   Venous stasis ulcers of both lower extremities   Hypertensive urgency   S/P left knee arthroscopy   DM (diabetes mellitus) type II controlled peripheral vascular disorder (HCC)   Morbid obesity due to excess calories (HCC)   Bradycardia with 41 - 50 beats per minute   Sepsis, unspecified organism [facility])   Pulmonary vascular congestion   Postoperative anemia due to acute blood loss   Aspiration pneumonitis (HCC)   1. Acute respiratory failure with hypoxia (HCC) - appears to be secondary to bilateral PNA, per CXR.   2. Hypertensive urgency - SBP still slightly high at times in the [id number] systolic around 6am. Monitor BP after AM meds given. Adjust if needed.  3. Pulmonary vascular congestion/acute on chronic diastolic HF - last 2 D ECHO 04/2015 with normal EF - clinically at/approaching euvolemia, renal parameters at baseline - switched to his home dose furosemide yesterday. - good UOP yesterday, 1-.9L out. Net I/os negative 5.3L total.  4. OSA, OHS - CPAP at night time   5. S/P TOTAL LEFT KNEE ARTHROPLASTY, post op day #9 - per ortho team  - Eliquis for DVT prophylaxis   6. Mild aortic valve stenosis   LOS: 10 days    Brittainy M. Ladoris Gene 06/10/2015 8:56 AM  I have seen, examined and evaluated the patient this PM along with Ms. Rosita Fire, PA-c.  After reviewing all the available data and chart,  I agree with her findings, examination as well as impression recommendations.  Kinda groggy today - brisk UOP despite being on PO lasix.   BP not at goal, but not many additional options to consider besides Clonidine -- agree with Dr. Sallyanne Kuster -- would manage as OP.  A & C HFPEF  resolving - needs to use CPAP qhs.  TRH service treating possible PNA  No additional recommendations at this time.  Will follow along.     Leonie Man, M.D., M.S. Interventional Cardiologist   Pager # [phone] Phone # [phone] [address] [place],  [address]

## 2015-06-10 NOTE — Progress Notes (Signed)
Spoke with pt regarding cpap.  Pt stated he was not ready and asked that I come back around midnight.  This RT will return at that time per pt request.

## 2015-06-10 NOTE — Progress Notes (Signed)
Nutrition Brief Note  Patient identified on the Malnutrition Screening Tool (MST) Report  Wt Readings from Last 15 Encounters:  06/10/15 329 lb 1.6 oz (149.279 kg)  05/24/15 325 lb 11.2 oz (147.737 kg)  05/15/15 332 lb (150.594 kg)  04/30/15 332 lb (150.594 kg)  08/15/14 322 lb (146.058 kg)  08/03/14 324 lb 9.6 oz (147.238 kg)  07/20/14 324 lb 12.8 oz (147.328 kg)  07/15/14 326 lb 8 oz (148.099 kg)  07/13/14 324 lb 3.2 oz (147.056 kg)  03/28/14 328 lb 14.4 oz (149.188 kg)  12/27/12 335 lb 4.8 oz (152.091 kg)  12/22/12 334 lb (151.501 kg)    Body mass index is 43.43 kg/(m^2). Patient meets criteria for morbid obesity based on current BMI.   Current diet order is Carb Modified, patient is consuming approximately 80-100% of meals at this time. Labs and medications reviewed.   No nutrition interventions warranted at this time. If nutrition issues arise, please consult RD.     Jarome Matin, RD, LDN Inpatient Clinical Dietitian Pager # [phone] After hours/weekend pager # [phone]

## 2015-06-10 NOTE — Progress Notes (Signed)
Physical Therapy Treatment Patient Details Name: FITZHUGH MANAS MRN: [id number] DOB: Feb 22, 1951 Today's Date: 06/10/2015    History of Present Illness Pt is a 65 year old male s/p L TKA with post op acute respiratory failure with hypoxia initially requiring BiPAP, PNA and hypertensive urgency.  PMHx HTN, DM, PAD, OSA, obesity    PT Comments    Patient is slowly improving but continues to require extensive assistance and rased surfaces. Patient c/o back pain as well. Patient will benefit fro  SNF for rehab.  Follow Up Recommendations  SNF;Supervision/Assistance - 24 hour     Equipment Recommendations  Rolling walker with 5" wheels    Recommendations for Other Services       Precautions / Restrictions Precautions Precautions: Fall;Knee Precaution Comments: instructed on KI use for ambulation Required Braces or Orthoses: Knee Immobilizer - Left Knee Immobilizer - Left: Discontinue once straight leg raise with < 10 degree lag Restrictions Weight Bearing Restrictions: No    Mobility  Bed Mobility   Bed Mobility: Supine to Sit     Supine to sit: HOB elevated;Min assist     General bed mobility comments: assist with L:LE, assist with trunk to upright  Transfers Overall transfer level: Needs assistance Equipment used: Rolling walker (2 wheeled) Transfers: Sit to/from [facility] Sit to Stand: +2 physical assistance;+2 safety/equipment;From elevated surface;Mod assist Stand pivot transfers: Mod assist;+2 physical assistance;+2 safety/equipment       General transfer comment: assist to power up from bed using momentum, verbal cues hand placement, bed at elevated level, stood x 3, needed BEDPAN, no BSC that is fitting appropriate so placed under while   on edge of the bed. Stood again and took 5 steps to recliner. Decloned to ambulate farther.  Ambulation/Gait                 Stairs            Wheelchair Mobility    Modified Rankin  (Stroke Patients Only)       Balance Overall balance assessment: Needs assistance         Standing balance support: During functional activity;Bilateral upper extremity supported Standing balance-Leahy Scale: Poor                      Cognition Arousal/Alertness: Awake/alert                          Exercises      General Comments        Pertinent Vitals/Pain Pain Score: 4  Pain Location: L knee with activity, back more than knee Pain Descriptors / Indicators: Tightness;Sore Pain Intervention(s): Monitored during session;Repositioned    Home Living                      Prior Function            PT Goals (current goals can now be found in the care plan section) Acute Rehab PT Goals PT Goal Formulation: With patient Time For Goal Achievement: 06/17/15 Potential to Achieve Goals: Good Progress towards PT goals: Progressing toward goals    Frequency  Min 6X/week    PT Plan Current plan remains appropriate    Co-evaluation             End of Session Equipment Utilized During Treatment: Gait belt;Left knee immobilizer Activity Tolerance: Patient limited by fatigue Patient left: in chair;with call bell/phone within reach;with  chair alarm set     Time: [id number] PT Time Calculation (min) (ACUTE ONLY): 27 min  Charges:  $Therapeutic Activity: 23-37 mins                    G Codes:      Claretha Cooper 06/10/2015, 9:53 AM Tresa Endo PT [phone]

## 2015-06-10 NOTE — Care Management Important Message (Signed)
Important Message  Patient Details  Name: Mario Mills MRN: [id number] Date of Birth: 1950/06/12   Medicare Important Message Given:  Yes    [name] 06/10/2015, 12:27 [place] Message  Patient Details  Name: Mario Mills MRN: [id number] Date of Birth: 1951-04-26   Medicare Important Message Given:  Yes    [name] 06/10/2015, 12:27 PM

## 2015-06-11 DIAGNOSIS — R001 Bradycardia, unspecified: Secondary | ICD-10-CM

## 2015-06-11 LAB — CBC WITH DIFFERENTIAL/PLATELET
BASOS ABS: 0 10*3/uL (ref 0.0–0.1)
BASOS PCT: 0 %
EOS ABS: 0.2 10*3/uL (ref 0.0–0.7)
Eosinophils Relative: 2 %
HEMATOCRIT: 30.5 % — AB (ref 39.0–52.0)
Hemoglobin: 9.2 g/dL — ABNORMAL LOW (ref 13.0–17.0)
Lymphocytes Relative: 15 %
Lymphs Abs: 1.9 10*3/uL (ref 0.7–4.0)
MCH: 28.3 pg (ref 26.0–34.0)
MCHC: 30.2 g/dL (ref 30.0–36.0)
MCV: 93.8 fL (ref 78.0–100.0)
MONO ABS: 1 10*3/uL (ref 0.1–1.0)
MONOS PCT: 8 %
NEUTROS ABS: 9.6 10*3/uL — AB (ref 1.7–7.7)
Neutrophils Relative %: 75 %
PLATELETS: 577 10*3/uL — AB (ref 150–400)
RBC: 3.25 MIL/uL — ABNORMAL LOW (ref 4.22–5.81)
RDW: 15.8 % — AB (ref 11.5–15.5)
WBC: 12.8 10*3/uL — ABNORMAL HIGH (ref 4.0–10.5)

## 2015-06-11 LAB — BASIC METABOLIC PANEL
Anion gap: 8 (ref 5–15)
BUN: 17 mg/dL (ref 6–20)
CO2: 31 mmol/L (ref 22–32)
CREATININE: 1.03 mg/dL (ref 0.61–1.24)
Calcium: 9 mg/dL (ref 8.9–10.3)
Chloride: 102 mmol/L (ref 101–111)
Glucose, Bld: 142 mg/dL — ABNORMAL HIGH (ref 65–99)
POTASSIUM: 3.8 mmol/L (ref 3.5–5.1)
SODIUM: 141 mmol/L (ref 135–145)

## 2015-06-11 LAB — GLUCOSE, CAPILLARY
Glucose-Capillary: 152 mg/dL — ABNORMAL HIGH (ref 65–99)
Glucose-Capillary: 169 mg/dL — ABNORMAL HIGH (ref 65–99)

## 2015-06-11 MED ORDER — ONDANSETRON HCL 4 MG PO TABS: 4.0000 mg | ORAL_TABLET | Freq: Four times a day (QID) | ORAL | Status: DC | PRN

## 2015-06-11 MED ORDER — LACTULOSE 10 GM/15ML PO SOLN: 20.0000 g | Freq: Two times a day (BID) | ORAL | Status: DC | PRN

## 2015-06-11 MED ORDER — BISACODYL 10 MG RE SUPP: 10.0000 mg | Freq: Every day | RECTAL | Status: DC | PRN

## 2015-06-11 MED ORDER — ATORVASTATIN CALCIUM 20 MG PO TABS: 20.0000 mg | ORAL_TABLET | Freq: Every day | ORAL | Status: DC

## 2015-06-11 MED ORDER — DIPHENHYDRAMINE HCL 12.5 MG/5ML PO ELIX: 12.5000 mg | ORAL_SOLUTION | ORAL | Status: DC | PRN

## 2015-06-11 MED ORDER — METHOCARBAMOL 500 MG PO TABS: 500.0000 mg | ORAL_TABLET | Freq: Four times a day (QID) | ORAL | Status: DC | PRN

## 2015-06-11 MED ORDER — GUAIFENESIN-DM 100-10 MG/5ML PO SYRP: 5.0000 mL | ORAL_SOLUTION | ORAL | Status: DC | PRN

## 2015-06-11 MED ORDER — DOCUSATE SODIUM 100 MG PO CAPS: 100.0000 mg | ORAL_CAPSULE | Freq: Two times a day (BID) | ORAL | Status: DC

## 2015-06-11 MED ORDER — OXYCODONE HCL 5 MG PO TABS: 5.0000 mg | ORAL_TABLET | ORAL | Status: DC | PRN

## 2015-06-11 MED ORDER — TRAMADOL HCL 50 MG PO TABS: 50.0000 mg | ORAL_TABLET | Freq: Four times a day (QID) | ORAL | Status: DC | PRN

## 2015-06-11 MED ORDER — APIXABAN 2.5 MG PO TABS: 2.5000 mg | ORAL_TABLET | Freq: Two times a day (BID) | ORAL | Status: DC

## 2015-06-11 MED ORDER — ACETAMINOPHEN 325 MG PO TABS: 650.0000 mg | ORAL_TABLET | Freq: Four times a day (QID) | ORAL | Status: DC | PRN

## 2015-06-11 MED ORDER — DILTIAZEM HCL ER COATED BEADS 360 MG PO CP24: 360.0000 mg | ORAL_CAPSULE | Freq: Every day | ORAL | Status: DC

## 2015-06-11 MED ORDER — POLYETHYLENE GLYCOL 3350 17 G PO PACK: 17.0000 g | PACK | Freq: Every day | ORAL | Status: DC | PRN

## 2015-06-11 MED ORDER — PANTOPRAZOLE SODIUM 40 MG PO TBEC
40.0000 mg | DELAYED_RELEASE_TABLET | Freq: Every day | ORAL | Status: DC
Start: 2015-06-11 — End: 2016-05-12

## 2015-06-11 MED ORDER — GI COCKTAIL ~~LOC~~: 30.0000 mL | Freq: Three times a day (TID) | ORAL | Status: DC | PRN

## 2015-06-11 MED ORDER — METOCLOPRAMIDE HCL 5 MG PO TABS: 5.0000 mg | ORAL_TABLET | Freq: Three times a day (TID) | ORAL | Status: DC | PRN

## 2015-06-11 MED ORDER — ISOSORB DINITRATE-HYDRALAZINE 20-37.5 MG PO TABS
2.0000 | ORAL_TABLET | Freq: Three times a day (TID) | ORAL | Status: DC
Start: 2015-06-11 — End: 2016-05-12

## 2015-06-11 MED ORDER — LEVALBUTEROL HCL 1.25 MG/0.5ML IN NEBU: 1.2500 mg | INHALATION_SOLUTION | RESPIRATORY_TRACT | Status: DC | PRN

## 2015-06-11 NOTE — Progress Notes (Signed)
Report called to Maudie Mercury, Therapist, sports at [facility].

## 2015-06-11 NOTE — Care Management Important Message (Signed)
Important Message  Patient Details  Name: HAYWARD CASS MRN: [id number] Date of Birth: [birth year]   Medicare Important Message Given:  Yes    [name] 06/11/2015, 10:49 AMImportant Message  Patient Details  Name: REID BASTYR MRN: [id number] Date of Birth: Aug 18, 1950   Medicare Important Message Given:  Yes    [name] 06/11/2015, 10:48 AM

## 2015-06-11 NOTE — Progress Notes (Addendum)
LATE ENTRY NOTE Date of Service of Visit - Monday 06/10/2015    Subjective: 10 Days Post-Op Procedure(s) (LRB): TOTAL LEFT  KNEE ARTHROPLASTY (Left) Patient reports pain as mild.   Patient seen in rounds with Dr. Wynelle Link. Patient is well, and has had no acute complaints or problems Plan is to go Skilled nursing facility after hospital stay.  Objective: Vital signs in last 24 hours: Temp:  [98.4 F (36.9 C)-99.3 F (37.4 C)] 98.4 F (36.9 C) (01/24 0449) Pulse Rate:  [58-75] 64 (01/24 0449) Resp:  [20-22] 20 (01/24 0449) BP: (162-171)/(60-75) 162/64 mmHg (01/24 0449) SpO2:  [94 %-97 %] 96 % (01/24 0449) Weight:  [149.5 kg (329 lb 9.4 oz)] 149.5 kg (329 lb 9.4 oz) (01/24 0449)  Intake/Output from previous day:  Intake/Output Summary (Last 24 hours) at 06/11/15 0746 Last data filed at 06/11/15 [id number]  Gross per 24 hour  Intake    480 ml  Output    701 ml  Net   -221 ml      Recent Labs  06/09/15 0607 06/10/15 0645   HGB 9.0* 9.3*     Recent Labs  06/10/15 0645   WBC 11.0*   RBC 3.23*   HCT 30.1*   PLT 515*     Recent Labs  06/10/15 0645   NA 141   K 4.2   CL 102   CO2 30   BUN 20   CREATININE 1.21   GLUCOSE 143*   CALCIUM 8.9    No results for input(s): LABPT, INR in the last 72 hours.  EXAM General - Patient is Alert, Appropriate and Oriented Extremity - Neurovascular intact Sensation intact distally No cellulitis present Dressing/Incision - clean, dry, no drainage Motor Function - intact, moving foot and toes well on exam.   Past Medical History  Diagnosis Date  . Hypertension   . Diabetes mellitus   . Hyperlipidemia   . Obstructive sleep apnea     CPAP  . Peripheral arterial disease ([place])   . Venous insufficiency   . Venous stasis ulcer ([place])   . DOE (dyspnea on exertion)     WITH MINIMAL EXERTION  . HA (headache)     WAKE UP FREQUENTLY WITH HA  . History of Doppler ultrasound 12/2012    LE VENOUS REFLUX demostrated on L (vein  stripping on R w/MRSA); h/o venous stasis bilat   . Atrial fibrillation ([place])   . Heart murmur   . Dysrhythmia   . History of urinary tract infection   . Urinary incontinence   . Arthritis   . Numbness and tingling     feet bilat   . Falls     Assessment/Plan: 10 Days Post-Op Procedure(s) (LRB): TOTAL LEFT  KNEE ARTHROPLASTY (Left) Principal Problem:   Acute respiratory failure with hypoxia (HCC) Active Problems:   Essential hypertension   Obstructive sleep apnea   Venous stasis ulcers of both lower extremities   Hypertensive urgency   S/P left knee arthroscopy   DM (diabetes mellitus) type II controlled peripheral vascular disorder (HCC)   Morbid obesity due to excess calories (HCC)   Bradycardia with 41 - 50 beats per minute   Sepsis, unspecified organism [facility])   Pulmonary vascular congestion   Postoperative anemia due to acute blood loss   Aspiration pneumonitis (HCC)   Acute on chronic diastolic (congestive) heart failure (HCC)  Estimated body mass index is 43.49 kg/(m^2) as calculated from the following:   Height as of this encounter: 6'  1" (1.854 m).   Weight as of this encounter: 149.5 kg (329 lb 9.4 oz). Up with therapy Plan for discharge tomorrow Discharge to SNF if okay with medicine  DVT Prophylaxis - Eliquis Weight-Bearing as tolerated to left leg  Arlee Muslim, PA-C Orthopaedic Surgery 06/11/2015, 7:46 AM

## 2015-06-11 NOTE — Progress Notes (Signed)
Patient Profile: 65 y.o. year old male with a history of HTN, DM, HL, OSA on CPAP, PAD, was admitted 01/10 for L TKA. He had been seen by Dr Gwenlyn Found 04/30/15 for pre op clearance. Myoview not normal but low risk, echo showed normal LVF with mild AS. Pt cleared for surgery. The pt had post-op resp failure felt to be secondary to PNA req BiPAP. He also had labile  HTN requiring medication adjustments. Cards consulted to help w/ HTN and volume.   Subjective: No complaints. No CP or dyspnea.   Objective: Vital signs in last 24 hours: Temp:  [98.4 F (36.9 C)-99.3 F (37.4 C)] 98.4 F (36.9 C) (01/24 0449) Pulse Rate:  [58-75] 64 (01/24 0449) Resp:  [20-22] 20 (01/24 0449) BP: (162-171)/(60-75) 162/64 mmHg (01/24 0449) SpO2:  [94 %-97 %] 96 % (01/24 0449) Weight:  [329 lb 9.4 oz (149.5 kg)] 329 lb 9.4 oz (149.5 kg) (01/24 0449) Last BM Date: 06/06/15  Intake/Output from previous day: 01/23 0701 - 01/24 0700 In: 480 [P.O.:480] Out: 701 [Urine:700; Stool:1] Intake/Output this shift: Total I/O In: -  Out: 350 [Urine:350]  Medications Current Facility-Administered Medications  Medication Dose Route Frequency Provider Last Rate Last Dose  . acetaminophen (TYLENOL) tablet 650 mg  650 mg Oral Q6H PRN Gaynelle Arabian, MD   650 mg at 06/06/15 2140   Or  . acetaminophen (TYLENOL) suppository 650 mg  650 mg Rectal Q6H PRN Gaynelle Arabian, MD      . antiseptic oral rinse (CPC / CETYLPYRIDINIUM CHLORIDE 0.05%) solution 7 mL  7 mL Mouth Rinse q12n4p Gaynelle Arabian, MD   7 mL at 06/10/15 1711  . apixaban (ELIQUIS) tablet 2.5 mg  2.5 mg Oral Q12H Gaynelle Arabian, MD   2.5 mg at 06/11/15 0752  . atorvastatin (LIPITOR) tablet 20 mg  20 mg Oral QHS Gaynelle Arabian, MD   20 mg at 06/10/15 2109  . bisacodyl (DULCOLAX) suppository 10 mg  10 mg Rectal Daily PRN Gaynelle Arabian, MD   10 mg at 06/05/15 1136  . carvedilol (COREG) tablet 12.5 mg  12.5 mg Oral BID WC Theodis Blaze, MD   12.5 mg at 06/11/15 0752  .  chlorhexidine (PERIDEX) 0.12 % solution 15 mL  15 mL Mouth Rinse BID Gaynelle Arabian, MD   15 mL at 06/10/15 0931  . diazepam (VALIUM) tablet 5 mg  5 mg Oral BID Gaynelle Arabian, MD   5 mg at 06/10/15 2109  . diltiazem (CARDIZEM CD) 24 hr capsule 360 mg  360 mg Oral Daily Gaynelle Arabian, MD   360 mg at 06/10/15 0930  . diphenhydrAMINE (BENADRYL) 12.5 MG/5ML elixir 12.5-25 mg  12.5-25 mg Oral Q4H PRN Gaynelle Arabian, MD      . docusate sodium (COLACE) capsule 100 mg  100 mg Oral BID Gaynelle Arabian, MD   100 mg at 06/10/15 2108  . furosemide (LASIX) tablet 40 mg  40 mg Oral Daily Mihai Croitoru, MD   40 mg at 06/10/15 0931  . gabapentin (NEURONTIN) capsule 300 mg  300 mg Oral BID Gaynelle Arabian, MD   300 mg at 06/10/15 2108  . gi cocktail (Maalox,Lidocaine,Donnatal)  30 mL Oral TID PRN Theodis Blaze, MD   30 mL at 06/02/15 1052  . guaiFENesin-dextromethorphan (ROBITUSSIN DM) 100-10 MG/5ML syrup 5 mL  5 mL Oral Q4H PRN Theodis Blaze, MD   5 mL at 06/07/15 1726  . hydrALAZINE (APRESOLINE) injection 10 mg  10 mg Intravenous Q4H PRN  Theodis Blaze, MD   10 mg at 06/10/15 0559  . lisinopril (PRINIVIL,ZESTRIL) tablet 20 mg  20 mg Oral Daily Royetta Asal, RPH   20 mg at 06/10/15 0930   And  . hydrochlorothiazide (MICROZIDE) capsule 12.5 mg  12.5 mg Oral Daily Royetta Asal, RPH   12.5 mg at 06/10/15 0931  . HYDROmorphone (DILAUDID) injection 1 mg  1 mg Intravenous Q2H PRN Theodis Blaze, MD   1 mg at 06/03/15 [id number]  . insulin aspart (novoLOG) injection 0-15 Units  0-15 Units Subcutaneous TID WC Gaynelle Arabian, MD   5 Units at 06/10/15 1721  . insulin detemir (LEVEMIR) injection 20 Units  20 Units Subcutaneous QHS Gaynelle Arabian, MD   20 Units at 06/10/15 2109  . isosorbide-hydrALAZINE (BIDIL) 20-37.5 MG per tablet 2 tablet  2 tablet Oral TID Minus Breeding, MD   2 tablet at 06/10/15 2108  . lactulose (CHRONULAC) 10 GM/15ML solution 20 g  20 g Oral BID PRN Simbiso Ranga, MD   20 g at 06/09/15 2007  . levalbuterol  (XOPENEX) nebulizer solution 1.25 mg  1.25 mg Nebulization Q4H PRN Theodis Blaze, MD      . menthol-cetylpyridinium (CEPACOL) lozenge 3 mg  1 lozenge Oral PRN Gaynelle Arabian, MD       Or  . phenol (CHLORASEPTIC) mouth spray 1 spray  1 spray Mouth/Throat PRN Gaynelle Arabian, MD      . methocarbamol (ROBAXIN) tablet 500 mg  500 mg Oral Q6H PRN Gaynelle Arabian, MD   500 mg at 06/10/15 1245   Or  . methocarbamol (ROBAXIN) 500 mg in dextrose 5 % 50 mL IVPB  500 mg Intravenous Q6H PRN Gaynelle Arabian, MD   500 mg at 05/31/15 1423  . metoCLOPramide (REGLAN) tablet 5-10 mg  5-10 mg Oral Q8H PRN Gaynelle Arabian, MD       Or  . metoCLOPramide (REGLAN) injection 5-10 mg  5-10 mg Intravenous Q8H PRN Gaynelle Arabian, MD      . ondansetron [facility]) tablet 4 mg  4 mg Oral Q6H PRN Gaynelle Arabian, MD       Or  . ondansetron [facility]) injection 4 mg  4 mg Intravenous Q6H PRN Gaynelle Arabian, MD      . oxybutynin (DITROPAN) tablet 5 mg  5 mg Oral BID Gaynelle Arabian, MD   5 mg at 06/10/15 2109  . oxyCODONE (Oxy IR/ROXICODONE) immediate release tablet 5-10 mg  5-10 mg Oral Q3H PRN Gaynelle Arabian, MD   10 mg at 06/11/15 0757  . pantoprazole (PROTONIX) EC tablet 40 mg  40 mg Oral Q1200 Simbiso Ranga, MD   40 mg at 06/10/15 1240  . polyethylene glycol (MIRALAX / GLYCOLAX) packet 17 g  17 g Oral Daily PRN Gaynelle Arabian, MD   17 g at 06/09/15 [id number]  . traMADol (ULTRAM) tablet 50-100 mg  50-100 mg Oral Q6H PRN Gaynelle Arabian, MD        PE: General appearance: alert, cooperative, no distress and moderately obese Neck: no carotid bruit and no JVD Lungs: clear to auscultation bilaterally Heart: regular rate and rhythm, S1, S2 normal, 2/6 short systolic murmur LSB click, rub or gallop Extremities: no LEE Pulses: 2+ and symmetric Skin: warm and dry Neurologic: Grossly normal  Lab Results:   Recent Labs  06/09/15 0607 06/10/15 0645 06/11/15 0515  WBC 11.4* 11.0* 12.8*  HGB 9.0* 9.3* 9.2*  HCT 29.0* 30.1* 30.5*  PLT 475* 515*  577*   BMET  Recent Labs  06/09/15 0607 06/10/15 0645 06/11/15 0515  NA 140 141 141  K 3.9 4.2 3.8  CL 101 102 102  CO2 30 30 31   GLUCOSE 140* 143* 142*  BUN 23* 20 17  CREATININE 1.14 1.21 1.03  CALCIUM 8.7* 8.9 9.0     Assessment/Plan  Principal Problem:   Acute respiratory failure with hypoxia (HCC) Active Problems:   Essential hypertension   Obstructive sleep apnea   Venous stasis ulcers of both lower extremities   Hypertensive urgency   S/P left knee arthroscopy   DM (diabetes mellitus) type II controlled peripheral vascular disorder (HCC)   Morbid obesity due to excess calories (HCC)   Bradycardia with 41 - 50 beats per minute   Sepsis, unspecified organism [facility])   Pulmonary vascular congestion   Postoperative anemia due to acute blood loss   Aspiration pneumonitis (HCC)   Acute on chronic diastolic (congestive) heart failure ([place])   1. Acute respiratory failure with hypoxia s/p Lt TKR  05/31/15 - appears to be secondary to bilateral PNA, pneumonitis.   2. Hypertensive urgency - SBP still slightly high at times in the [id number] systolic, further adjustment as OP  3. Pulmonary vascular congestion/acute on chronic diastolic HF - last 2 D ECHO 04/2015 with normal EF - I/O neg 5.9L total.   4. OSA, OHS - CPAP at night   5. S/P TOTAL LEFT KNEE ARTHROPLASTY, post op day #9 - per ortho team  - Eliquis for DVT prophylaxis   6. Mild aortic valve stenosis  7. Morbid obesity - BMI 44   LOS: 11 days    Plan: Pt to go to SNF today for rehab. OP f/u with our office to be arranged. Discharge on current medications.   Note: History of AF mentioned in some notes but no documentation of this and the pt denies a history of AF. He will be on Eliquis for post op TKR- not for hx of AF.   Caeden Foots PA-C 06/11/2015 9:00 AM

## 2015-06-11 NOTE — Care Management Note (Signed)
Case Management Note  Patient Details  Name: RAWLAND SERINO MRN: [id number] Date of Birth: [birth year]  Subjective/Objective:                    Action/Plan:d/c SNF.   Expected Discharge Date:                  Expected Discharge Plan:  Skilled Nursing Facility  In-House Referral:  Clinical Social Work  Discharge planning Services  CM Consult  Post Acute Care Choice:    Choice offered to:     DME Arranged:    DME Agency:     HH Arranged:    [place] Agency:     Status of Service:  Completed, signed off  Medicare Important Message Given:  Yes Date Medicare IM Given:    Medicare IM give by:    Date Additional Medicare IM Given:    Additional Medicare Important Message give by:     If discussed at [place] of Stay Meetings, dates discussed:    Additional Comments:  Dessa Phi, RN 06/11/2015, 11:00 AM

## 2015-06-11 NOTE — Discharge Summary (Signed)
Physician Discharge Summary   Patient ID: TRA WILEMON MRN: [id number] DOB/AGE: [birth year] 65 y.o.  Admit date: 05/31/2015 Discharge date: 06-11-2015  Primary Diagnosis:  Osteoarthritis Left knee(s)  Admission Diagnoses:  Past Medical History  Diagnosis Date  . Hypertension   . Diabetes mellitus   . Hyperlipidemia   . Obstructive sleep apnea     CPAP  . Peripheral arterial disease ([place])   . Venous insufficiency   . Venous stasis ulcer ([place])   . DOE (dyspnea on exertion)     WITH MINIMAL EXERTION  . HA (headache)     WAKE UP FREQUENTLY WITH HA  . History of Doppler ultrasound 12/2012    LE VENOUS REFLUX demostrated on L (vein stripping on R w/MRSA); h/o venous stasis bilat   . Atrial fibrillation ([place])   . Heart murmur   . Dysrhythmia   . History of urinary tract infection   . Urinary incontinence   . Arthritis   . Numbness and tingling     feet bilat   . Falls    Discharge Diagnoses:   Principal Problem:   Acute respiratory failure with hypoxia (HCC) Active Problems:   Essential hypertension   Obstructive sleep apnea   Venous stasis ulcers of both lower extremities   Hypertensive urgency   S/P left knee arthroscopy   DM (diabetes mellitus) type II controlled peripheral vascular disorder (HCC)   Morbid obesity due to excess calories (HCC)   Bradycardia with 41 - 50 beats per minute   Sepsis, unspecified organism [facility])   Pulmonary vascular congestion   Postoperative anemia due to acute blood loss   Aspiration pneumonitis (HCC)   Acute on chronic diastolic (congestive) heart failure (HCC)  Estimated body mass index is 43.49 kg/(m^2) as calculated from the following:   Height as of this encounter: 6' 1" (1.854 m).   Weight as of this encounter: 149.5 kg (329 lb 9.4 oz).  Procedure:  Procedure(s) (LRB): TOTAL LEFT  KNEE ARTHROPLASTY (Left)   Consults: [facility], Cardiology  HPI: Mario Mills is a 65 y.o. year old male with end stage OA of  his left knee with progressively worsening pain and dysfunction. He has constant pain, with activity and at rest and significant functional deficits with difficulties even with ADLs. He has had extensive non-op management including analgesics, injections of cortisone and viscosupplements, and home exercise program, but remains in significant pain with significant dysfunction. Radiographs show bone on bone arthritis medial and patellofemoral. He presents now for left Total Knee Arthroplasty.   Laboratory Data: Admission on 05/31/2015  No results displayed because visit has over 200 results.    Hospital Outpatient Visit on 05/24/2015  Component Date Value Ref Range Status  . MRSA, PCR 05/24/2015 NEGATIVE  NEGATIVE Final  . Staphylococcus aureus 05/24/2015 POSITIVE* NEGATIVE Final   Comment:        The Xpert SA Assay (FDA approved for NASAL specimens in patients over 37 years of age), is one component of a comprehensive surveillance program.  Test performance has been validated by [facility] [place] for patients greater than or equal to 57 year old. It is not intended to diagnose infection nor to guide or monitor treatment.   [name] aPTT 05/24/2015 33  24 - 37 seconds Final  . WBC 05/24/2015 11.1* 4.0 - 10.5 K/uL Final  . RBC 05/24/2015 4.22  4.22 - 5.81 MIL/uL Final  . Hemoglobin 05/24/2015 12.5* 13.0 - 17.0 g/dL Final  . HCT 05/24/2015 38.8* 39.0 -  52.0 % Final  . MCV 05/24/2015 91.9  78.0 - 100.0 fL Final  . MCH 05/24/2015 29.6  26.0 - 34.0 pg Final  . MCHC 05/24/2015 32.2  30.0 - 36.0 g/dL Final  . RDW 05/24/2015 14.6  11.5 - 15.5 % Final  . Platelets 05/24/2015 325  150 - 400 K/uL Final  . Sodium 05/24/2015 142  135 - 145 mmol/L Final  . Potassium 05/24/2015 3.6  3.5 - 5.1 mmol/L Final  . Chloride 05/24/2015 104  101 - 111 mmol/L Final  . CO2 05/24/2015 28  22 - 32 mmol/L Final  . Glucose, Bld 05/24/2015 171* 65 - 99 mg/dL Final  . BUN 05/24/2015 23* 6 - 20 mg/dL Final  . Creatinine, Ser  05/24/2015 1.09  0.61 - 1.24 mg/dL Final  . Calcium 05/24/2015 9.4  8.9 - 10.3 mg/dL Final  . Total Protein 05/24/2015 7.4  6.5 - 8.1 g/dL Final  . Albumin 05/24/2015 3.7  3.5 - 5.0 g/dL Final  . AST 05/24/2015 22  15 - 41 U/L Final  . ALT 05/24/2015 26  17 - 63 U/L Final  . Alkaline Phosphatase 05/24/2015 66  38 - 126 U/L Final  . Total Bilirubin 05/24/2015 0.3  0.3 - 1.2 mg/dL Final  . GFR calc non Af Amer 05/24/2015 >60  >60 mL/min Final  . GFR calc Af Amer 05/24/2015 >60  >60 mL/min Final   Comment: (NOTE) The eGFR has been calculated using the CKD EPI equation. This calculation has not been validated in all clinical situations. eGFR's persistently <60 mL/min signify possible Chronic Kidney Disease.   . Anion gap 05/24/2015 10  5 - 15 Final  . Prothrombin Time 05/24/2015 13.0  11.6 - 15.2 seconds Final  . INR 05/24/2015 0.96  0.00 - 1.49 Final  . ABO/RH(D) 05/24/2015 O POS   Final  . Antibody Screen 05/24/2015 NEG   Final  . Sample Expiration 05/24/2015 06/03/2015   Final  . Extend sample reason 05/24/2015 NO TRANSFUSIONS OR PREGNANCY IN THE PAST 3 MONTHS   Final  . Color, Urine 05/24/2015 YELLOW  YELLOW Final  . APPearance 05/24/2015 CLEAR  CLEAR Final  . Specific Gravity, Urine 05/24/2015 1.026  1.005 - 1.030 Final  . pH 05/24/2015 5.0  5.0 - 8.0 Final  . Glucose, UA 05/24/2015 NEGATIVE  NEGATIVE mg/dL Final  . Hgb urine dipstick 05/24/2015 NEGATIVE  NEGATIVE Final  . Bilirubin Urine 05/24/2015 NEGATIVE  NEGATIVE Final  . Ketones, ur 05/24/2015 NEGATIVE  NEGATIVE mg/dL Final  . Protein, ur 05/24/2015 >300* NEGATIVE mg/dL Final  . Nitrite 05/24/2015 NEGATIVE  NEGATIVE Final  . Leukocytes, UA 05/24/2015 NEGATIVE  NEGATIVE Final  . Hgb A1c MFr Bld 05/24/2015 6.8* 4.8 - 5.6 % Final   Comment: (NOTE)         Pre-diabetes: 5.7 - 6.4         Diabetes: >6.4         Glycemic control for adults with diabetes: <7.0   . Mean Plasma Glucose 05/24/2015 148   Final   Comment:  (NOTE) Performed At: [facility] [place], [place] [id number] Lindon Romp MD [id number]:[id number]   . Squamous Epithelial / LPF 05/24/2015 0-5* NONE SEEN Final  . WBC, UA 05/24/2015 6-30  0 - 5 WBC/hpf Final  . RBC / HPF 05/24/2015 0-5  0 - 5 RBC/hpf Final  . Bacteria, UA 05/24/2015 FEW* NONE SEEN Final  . Casts 05/24/2015 HYALINE CASTS* NEGATIVE Final   GRANULAR  CAST  . ABO/RH(D) 05/24/2015 O POS   Final  Appointment on 05/15/2015  Component Date Value Ref Range Status  . Rest HR 05/16/2015 60   Final  . Rest BP 05/16/2015 165/65   Final  . Peak HR 05/16/2015 69   Final  . Peak BP 05/16/2015 153/74   Final  . LV Systolic Volume 40/12/6759 110   Final  . TID 05/16/2015 1.08   Final  . LV Diastolic Volume 95/01/3266 206   Final  . LHR 05/16/2015 0.28   Final  . SSS 05/16/2015 8   Final  . SRS 05/16/2015 4   Final  . SDS 05/16/2015 4   Final     X-Rays:Ct Angio Chest Pe W/cm &/or Wo Cm  06/02/2015  CLINICAL DATA:  Patient admitted for total knee arthroplasty. Postop hypoxia and shortness of breath. Evaluate for pulmonary embolism. EXAM: CT ANGIOGRAPHY CHEST WITH CONTRAST TECHNIQUE: Multidetector CT imaging of the chest was performed using the standard protocol during bolus administration of intravenous contrast. Multiplanar CT image reconstructions and MIPs were obtained to evaluate the vascular anatomy. CONTRAST:  100 mL Omnipaque 300 IV. COMPARISON:  Chest x-ray 05/31/2015 and abdominal CT 05/08/2011 FINDINGS: The lungs are well inflated with subtle hazy density adjacent the major fissure over the posterior right upper lobe likely dependent atelectasis. There has been resolution of the bilateral central airspace process seen on the recent chest x-ray likely resolved interstitial edema. There is a small amount of aspirate material along the right lateral wall lead tip of the distal trachea just above the carina. There is mild cardiomegaly. There is minimal calcified  plaque over the thoracic aorta. There is no evidence of pulmonary embolism. No evidence of mediastinal or hilar adenopathy. No significant axillary adenopathy. Images through the upper abdomen demonstrate possible sub cm cyst over the upper pole right kidney unchanged. There mild degenerate changes of the spine. Review of the MIP images confirms the above findings. IMPRESSION: No evidence of pulmonary embolism. Interval clearing of the previously noted bilateral central airspace process on recent chest radiograph likely resolved edema. Minimal residual hazy density over the posterior right upper lobe likely atelectasis. Small amount of aspirate material along the right lateral wall of the distal trachea just above the carina. Mild cardiomegaly. Electronically Signed   By: Marin Olp M.D.   On: 06/02/2015 15:45   Dg Chest Port 1 View  06/06/2015  CLINICAL DATA:  Shortness of breath following knee replacement 05/31/2015. EXAM: PORTABLE CHEST - 1 VIEW COMPARISON:  CTA chest 06/02/2015.  One-view chest x-ray 05/31/2015 FINDINGS: Low lung volumes exaggerate the heart size and interstitium. There is no significant edema or effusion. There significant clearing of interstitial and airspace disease since the prior exam. No focal airspace disease is present. The visualized soft tissues and bony thorax are unremarkable. IMPRESSION: 1. Interval clearing of interstitial and airspace disease. 2. No acute cardiopulmonary disease. Electronically Signed   By: San Morelle M.D.   On: 06/06/2015 14:31   Dg Chest Port 1 View  05/31/2015  CLINICAL DATA:  Shortness of breath, breathing difficulty EXAM: PORTABLE CHEST 1 VIEW COMPARISON:  08/15/2014 FINDINGS: Borderline cardiomegaly again noted. Bilateral patchy airspace disease and hazy interstitial prominence. Findings highly suspicious for bilateral pneumonia or bilateral pulmonary edema. No pneumothorax. No pleural effusion. Degenerative changes lower thoracic spine.  IMPRESSION: Bilateral patchy airspace disease and hazy interstitial prominence. Findings highly suspicious for bilateral pneumonia or bilateral pulmonary edema. No pneumothorax. Electronically Signed   By: Julien Girt  Pop M.D.   On: 05/31/2015 12:44    EKG: Orders placed or performed during the hospital encounter of 05/31/15  . EKG 12-Lead  . EKG 12-Lead  . EKG 12-Lead  . EKG 12-Lead  . EKG 12-Lead  . EKG 12-Lead  . EKG 12-Lead  . EKG 12-Lead     Hospital Course: Mario Mills is a 65 y.o. who was admitted to [facility]. They were brought to the operating room on 05/31/2015 and underwent Procedure(s): TOTAL LEFT  KNEE ARTHROPLASTY.  Patient tolerated the procedure well and was later transferred to the recovery room. Developed low O2 sats. Placed on BiPAP, given lasix and albuterol.  Medicine consult called. Transferred to Step Down Unit. Principal Problem:  Acute respiratory failure with hypoxia (HCC) - initially in BiPAP post op and currently saturating well on 6 L via [place] - this appears to be secondary to bilateral PNA as per CXR, pt currently unable to provide history so not clear if he is truly symptomatic c/w PNA - will place on broad spectrum ABX for now and treat conservatively, narrow down as soon as possible if no clear sings of an infectious etiology  - keep in SDU for now - also added Hydralazine as needed and scheduled dosing - sepsis order set in place, possibly related to bilateral PNA - hold coreg for now until HR stabilizes  - continue home medical regimen with insulin - hold oral antihyperglycemic regimen for now Dg Chest Port 1 View 05/31/2015 Bilateral patchy airspace disease and hazy interstitial prominence. Findings highly suspicious for bilateral pneumonia or bilateral pulmonary edema. No pneumothorax.  POD 1 - Drain pulled, sterile gauze applied. Patient reports pain as mild to moderate. Tolerating POs well. Denies BM, but admits to flatulence.  Denies fever, chills, N/V. Patient is in good spirits this am.Started on DVT prophylaxis in the form of Eliquis. PT and OT were ordered for total joint protocol.  Discharge planning consulted to help with postop disposition and equipment needs. It was felt that the patient would need SNF so social worker was requested. Principal Problem:  Acute respiratory failure with hypoxia (HCC) - initially on BiPAP post op and currently saturating > 95% on RA - appears to be secondary to bilateral PNA as per CXR - WBC trending down, transition to oral Levaquin today  - transfer to tele bed once off nitro drip  - transition to oral Levaquin   POD 2 - improving but with persistently high SBP in 180's, still on Nitro drip, transitioned to oral Levaquin. Tmax 100.1 F, worse pain in the left knee, WBC up 13.9 --> 15.6, Hg 11 -->9.8, SBP iin 180's on nitro drip  - follow up on CT angio chest and if PNA looks works may need to put back on broader coverage, for now continue Levaquin  - also change lasix to IV today to help with vascular congestion, please see below  - lisinopril - HCTZ was held pre op, will also resume today 1/15 - if no changes in next 24 hours, will need to consult pt's cardiologist Dr. Gwenlyn Found who pt saw several years ago  Bradycardia with 41 - 50 beats per minute - resolved, coreg to be resume today 1/15 12.5 mg PO BID as per home medical regimen  Patient reports pain as 3 on 0-10 scale. Denies CP. Voiding without difficulty. Positive flatus.  Pt is still having high BP, productive cough and CPAP hs.  Consider DC to SNF when medically stable.  POD 3 - Patient reports pain as mild. Patient seen in rounds by Dr. Wynelle Link.  Patient is well, but has had some minor complaints of pain in the knee, requiring pain medications  Having issues with blood pressure. Greatly appreciate Medicine help with this patient.  DVT Prophylaxis - Eliquis Acute respiratory failure with hypoxia (HCC) - on room  air this morning, denies dyspnea - appears to be secondary to bilateral PNA, per CXR.  - CT angio done 1/15 without evidence of PE and improvement of airspace processes - pt is on Levaquin, today is day #4 of ABX, fever curve improving - continue Lasix - last 2 D ECHO 04/2015 with normal EF - more vascular congestion on exam today, will change lasix to IV and monitor clinical response Cardiology Consulted Essential hypertension - currently on Coreg 12.5 bid, Cardizem CD 360 mg, hydralazine 50 mg tid (increased today), lisinopril/HCTZ 20/12.5 - All except the hydralazine are home meds at home doses. - weight has decreased last 48 hours, tolerated diuresis well.  Bradycardia with 41 - 50 beats per minute - on 01/13 pt had sinus bradycardia in the 50s, may have dropped briefly into the 40s, not sustained - pt also had episodic idioventricular rhythm, rate 60s, asymptomatic, no asleep. - with Dilt 360 and Coreg 12.5 bid, watch on telemetry Pulmonary vascular congestion - improved w/ IV Lasix - now back on home dose HCTZ - has PRN Lasix, consider giving a daily dose and changing lisinopril/HCTZ to lisinopril. Remains on CPAP  POD 4 - Acute respiratory failure with hypoxia (HCC) - on room air this morning, denies dyspnea - appears to be secondary to bilateral PNA, per CXR.  - CT angio done 1/15 without evidence of PE and improvement of airspace processes - pt is on Levaquin, today is day #5 of ABX, fever curve improving - continue Lasix - pt on Coreg 12.5 mg BID, Cardizem 360 mg QD, lasix 40 mg PRN, lisinopril - HCTZ at home  - also added Hydralazine 1/15, increase to 50 TID today. BP improved off IV NTG with systolics in the 696V to 893Y, HR 70s. . - more vascular congestion on exam today, will change lasix to IV and monitor clinical response Patient reports pain as mild.Patient seen in rounds with Dr. Wynelle Link. Patient is doing fine with regards to the knee but needs to be up walking  some before going to SNF. Patient states that he feels better overall and his blood pressure is better. Up with more therapy today.  Patient is well, and has had no acute complaints or problems with regards to the left knee. Plan is to go Skilled nursing facility after hospital stay.  Maybe out of stepdown depending upon Medical Service. On Elilquis. 1/17 - able to discontinue nitroglycerin drip the night before. - Patient seen this morning, CPAP mask on, he is comfortable, denies any shortness of breath, denies any chest pain.  POD 5 - Continued on CPAP.  Patient is well, and has had no acute complaints or problems Acute respiratory failure with hypoxia ([place]) - on room air this morning, denies dyspnea - pt is on Levaquin, today is day #6 of ABX, fever curve improving - Eliquis for DVT prophylaxis  Summary 06/05/15: I have seen and examined Mario Mills at bedside, reviewed his chart and discussed with orthopedics. Appreciate cardiology/orthopedics. 65 y.o. year old male with end stage OA of his left knee with progressively worsening pain and dysfunction, admitted for left Total Knee Arthroplasty. Post op  hypoxic and placed on BiPAP, TRH consulted to evaluate post op hypoxia. He may have aspirated, staph aureus swab positive earlier in the admission. His white count is going up after transitioning to Levaquin. We'll therefore add back vancomycin and give Flagyl and reassess. He feels better. Plan Acute respiratory failure with hypoxia (HCC)/Sepsis, unspecified organism (HCC)/Pulmonary vascular congestion/Obstructive sleep apnea  Vancomycin/Flagyl  Continue Levaquin Essential hypertension/Hypertensive urgency/DM (diabetes mellitus) type II/controlled peripheral vascular disorderHCC)/Morbid obesity due to excess calories (HCC)/Bradycardia with 41 - 50 beats per minute  Appreciate cardiology  Follow cardiology recommendations  Continue current management for diabetes mellitus Venous stasis ulcers  of both lower extremities/S/P left knee arthroscopy/Postoperative anemia due to acute blood loss  defer management to orthopedics  POD 6 - Acute respiratory failure with hypoxia ([place]) - pt is on Levaquin, today is day #6 of ABX, fever curve improving - continue Lasix po - BiDIl added now at BID Increase to TID. BP improved today Patient seen in rounds by Dr. Wynelle Link from ortho standpoint. Patient is well, and has had no acute complaints or problems. Summary&Daily Progress Notes 06/05/15: I have seen and examined Mario Mills at bedside, reviewed his chart and discussed with orthopedics. Appreciate cardiology/orthopedics. 65 y.o. year old male with end stage OA of his left knee with progressively worsening pain and dysfunction, admitted for left Total Knee Arthroplasty. Post op hypoxic and placed on BiPAP, TRH consulted to evaluate post op hypoxia. He may have aspirated, staph aureus swab positive earlier in the admission. His white count is going up after transitioning to Levaquin. We'll therefore add back vancomycin and give Flagyl and reassess. He feels better. 06/06/15: Appreciate orthopedics/cardiology. Patient continued to have fever last night and her white count has increased. He may still have residual aspiration pneumonitis. Will change antibiotics to Zosyn only and reassess. If patient continues to have fever and white count continues to increase, would then look to perform further investigations for the possibilities of infection. Meanwhile, will repeat UA/urine culture/chest x-ray. Patient drowsy this afternoon.  POD 7 - Only complained of feeling SOB when came off his CPAP this AM now stable. - continue Lasix IV still at 40 mg IV daily  -CXR is improved CSW following for discharge to [facility] when medically ready. CSW, Roselyn Reef confirmed with PA, Dian Situ that patient will not be discharged over the weekend. Ivin Booty at [facility] updated.  06/07/15: Appreciate  cardiology/orthopedics. Patient had fever last night but this seems to have subsided. UA unremarkable and CXR shows "1. Interval clearing of interstitial and airspace disease. 2. No acute cardiopulmonary disease". Patient more with it today and denies any complaints. Patient likely has partially treated infection- ?aspiration pneumonitis and/or other sources. Will continue Zosyn and consider further imaging/ID if fever persists, also obtain sputum culture.   POD 8 - Pulmonary vascular congestion - clinically at/approaching euvolemia - switched furosemide to PO today Low grade fever and elevated WBC persist despite antibiotics. Patient reports pain as mild, pain controlled. Feels that he is doing well with PT and working on the exercises on his own. He is in very good spirits and joking with staff. Encouraged to continue to work on there exercises. Ice to the knee to help with the swelling and inflammation. Up with therapy.  DVT Prophylaxis - Eliquis.  Weight-Bearing as tolerated to left leg.  Plan is to go Rehab after hospital stay 06/08/15: Fever seems to have subsided. White count is 12,200. Patient feels better. We'll continue Zosyn and follow  orthopedics/cardiology recommendations.  POD 9 - Hypertensive urgency - SBP still slightly high at times, but do not plan any more changes ind BP meds as inpatient - switched furosemide to PO (on his home dose) Patient reports pain as 1 on 0-10 scale. Wound looks fine. He has no major complaints. He said he is ready to go to [facility]. Cardiology note reviewed.last Temp.100 WBC 11.4.  Up with therapy. 06/09/15: White count improved to 11,400. No more fever. Patient feels better. Would continue Zosyn until tomorrow. Continue rest of management per orthopedics/cardiology.  POD 10 - No complaints. Had a BM today, first time since admission. No CP or dyspnea.  Hypertensive urgency - SBP still slightly high at times in the 829F systolic around 6am. Monitor  BP after AM meds given. Adjust if needed. BP not at goal, but not many additional options to consider besides Clonidine -- agree with Dr. Sallyanne Kuster -- would manage as OP. A & C HFPEF resolving - needs to use CPAP qhs. TRH service treating possible PNA No additional recommendations at this time. Will follow along. 06/10/15: White count improved. Had BM today. Will d/c Zosyn. ?SNF today. Plan for discharge tomorrow. Patient seen in rounds with Dr. Wynelle Link. Patient is well, and has had no acute complaints or problems Plan is to go Skilled nursing facility after hospital stay.  POD 11 - 06/11/2015 - Patient reports pain as mild.Patient seen in rounds with Dr. Wynelle Link.  Patient is well, and has had no acute complaints or problems,  Patient is ready to go to the SNF if okay with Medicine.  Will setup transfer.  Discharge to SNF if cleared to transfer Diet - Cardiac diet and Diabetic diet Follow up - in 1 week, next Tuesday 06/18/2015 Activity - WBAT Disposition - Skilled nursing facility Condition Upon Discharge - improving D/C Meds - See DC Summary DVT Prophylaxis - Eliquis      Discharge Instructions    Call MD / Call 911    Complete by:  As directed   If you experience chest pain or shortness of breath, CALL 911 and be transported to the hospital emergency room.  If you develope a fever above 101 F, pus (white drainage) or increased drainage or redness at the wound, or calf pain, call your surgeon's office.     Change dressing    Complete by:  As directed   Change dressing daily with sterile 4 x 4 inch gauze dressing and apply TED hose. Do not submerge the incision under water.     Constipation Prevention    Complete by:  As directed   Drink plenty of fluids.  Prune juice may be helpful.  You may use a stool softener, such as Colace (over the counter) 100 mg twice a day.  Use MiraLax (over the counter) for constipation as needed.     Diet - low sodium heart healthy    Complete by:  As  directed      Diet Carb Modified    Complete by:  As directed      Discharge instructions    Complete by:  As directed   Pick up stool softner and laxative for home use following surgery while on pain medications. Do not submerge incision under water. Please use good hand washing techniques while changing dressing each day. May shower. Please use a clean towel to pat the incision dry following showers. Continue to use ice for pain and swelling after surgery. Do not use any lotions  or creams on the incision until instructed by your surgeon.  Take Eliquis twice a day for two more weeks and then discontinue the Eliquis.  After completing the Eliquis treatment, then start a baby 81 mg Aspirin daily for three additional weeks.  Postoperative Constipation Protocol  Constipation - defined medically as fewer than three stools per week and severe constipation as less than one stool per week.  One of the most common issues patients have following surgery is constipation.  Even if you have a regular bowel pattern at home, your normal regimen is likely to be disrupted due to multiple reasons following surgery.  Combination of anesthesia, postoperative narcotics, change in appetite and fluid intake all can affect your bowels.  In order to avoid complications following surgery, here are some recommendations in order to help you during your recovery period.  Colace (docusate) - Pick up an over-the-counter form of Colace or another stool softener and take twice a day as long as you are requiring postoperative pain medications.  Take with a full glass of water daily.  If you experience loose stools or diarrhea, hold the colace until you stool forms back up.  If your symptoms do not get better within 1 week or if they get worse, check with your doctor.  Dulcolax (bisacodyl) - Pick up over-the-counter and take as directed by the product packaging as needed to assist with the movement of your bowels.  Take with a  full glass of water.  Use this product as needed if not relieved by Colace only.   MiraLax (polyethylene glycol) - Pick up over-the-counter to have on hand.  MiraLax is a solution that will increase the amount of water in your bowels to assist with bowel movements.  Take as directed and can mix with a glass of water, juice, soda, coffee, or tea.  Take if you go more than two days without a movement. Do not use MiraLax more than once per day. Call your doctor if you are still constipated or irregular after using this medication for 7 days in a row.  If you continue to have problems with postoperative constipation, please contact the office for further assistance and recommendations.  If you experience "the worst abdominal pain ever" or develop nausea or vomiting, please contact the office immediatly for further recommendations for treatment.  When discharged from the skilled rehab facility, please have the facility set up the patient's [place] prior to being released.  Please make sure this gets set up prior to release in order to avoid any lapse of therapy following the rehab stay.  Also provide the patient with their medications at time of release from the facility to include their pain medication, the muscle relaxants, and their blood thinner medication.  If the patient is still at the rehab facility at time of follow up appointment, please also assist the patient in arranging follow up appointment in our office and any transportation needs. ICE to the affected knee or hip every three hours for 30 minutes at a time and then as needed for pain and swelling.     Do not put a pillow under the knee. Place it under the heel.    Complete by:  As directed      Do not sit on low chairs, stoools or toilet seats, as it may be difficult to get up from low surfaces    Complete by:  As directed      Driving restrictions  Complete by:  As directed   No driving until released by the  physician.     Increase activity slowly as tolerated    Complete by:  As directed      Lifting restrictions    Complete by:  As directed   No lifting until released by the physician.     Patient may shower    Complete by:  As directed   You may shower without a dressing once there is no drainage.  Do not wash over the wound.  If drainage remains, do not shower until drainage stops.     TED hose    Complete by:  As directed   Use stockings (TED hose) for 3 weeks on both leg(s).  You may remove them at night for sleeping.     Weight bearing as tolerated    Complete by:  As directed   Laterality:  left  Extremity:  Lower            Medication List    STOP taking these medications        BAYER BACK & BODY PO     Cinnamon 500 MG capsule     COENZYME Q-10 PO     Diclofenac-Misoprostol 75-0.2 MG Tbec     HYDROmorphone 4 MG tablet  Commonly known as:  DILAUDID     KRILL OIL PO     L-Arginine 500 MG Caps     multivitamin with minerals Tabs tablet     vitamin E 1000 UNIT capsule      TAKE these medications        acetaminophen 325 MG tablet  Commonly known as:  TYLENOL  Take 2 tablets (650 mg total) by mouth every 6 (six) hours as needed for mild pain, fever or headache (or Fever >/= 101).     albuterol 108 (90 Base) MCG/ACT inhaler  Commonly known as:  PROVENTIL HFA;VENTOLIN HFA  Inhale 2 puffs into the lungs every 4 (four) hours as needed for wheezing or shortness of breath (cough, shortness of breath or wheezing.).     apixaban 2.5 MG Tabs tablet  Commonly known as:  ELIQUIS  Take 1 tablet (2.5 mg total) by mouth every 12 (twelve) hours. Take twice a day for two more weeks, then discontinue the Eliquis. After completing the Eliquis treatment, then start a baby 81 mg Aspirin daily for three additional weeks.     atorvastatin 20 MG tablet  Commonly known as:  LIPITOR  Take 1 tablet (20 mg total) by mouth at bedtime.     bisacodyl 10 MG suppository  Commonly  known as:  DULCOLAX  Place 1 suppository (10 mg total) rectally daily as needed for moderate constipation.     carvedilol 12.5 MG tablet  Commonly known as:  COREG  Take 12.5 mg by mouth 2 (two) times daily with a meal.     diazepam 5 MG tablet  Commonly known as:  VALIUM  Take 5 mg by mouth 2 (two) times daily.     diltiazem 360 MG 24 hr capsule  Commonly known as:  CARDIZEM CD  Take 1 capsule (360 mg total) by mouth daily.     diphenhydrAMINE 12.5 MG/5ML elixir  Commonly known as:  BENADRYL  Take 5-10 mLs (12.5-25 mg total) by mouth every 4 (four) hours as needed for itching.     docusate sodium 100 MG capsule  Commonly known as:  COLACE  Take 1 capsule (100 mg total) by mouth  2 (two) times daily.     furosemide 40 MG tablet  Commonly known as:  LASIX  Take 40 mg by mouth daily as needed for fluid.     gabapentin 300 MG capsule  Commonly known as:  NEURONTIN  Take 300 mg by mouth 2 (two) times daily.     gi cocktail Susp suspension  Take 30 mLs by mouth 3 (three) times daily as needed for indigestion. Shake well.     glimepiride 4 MG tablet  Commonly known as:  AMARYL  Take 4 mg by mouth daily before breakfast.     guaiFENesin-dextromethorphan 100-10 MG/5ML syrup  Commonly known as:  ROBITUSSIN DM  Take 5 mLs by mouth every 4 (four) hours as needed for cough.     isosorbide-hydrALAZINE 20-37.5 MG tablet  Commonly known as:  BIDIL  Take 2 tablets by mouth 3 (three) times daily.     lactulose 10 GM/15ML solution  Commonly known as:  CHRONULAC  Take 30 mLs (20 g total) by mouth 2 (two) times daily as needed for mild constipation.     levalbuterol 1.25 MG/0.5ML nebulizer solution  Commonly known as:  XOPENEX  Take 1.25 mg by nebulization every 4 (four) hours as needed for wheezing or shortness of breath.     LEVEMIR FLEXTOUCH 100 UNIT/ML Pen  Generic drug:  Insulin Detemir  Inject 20 Units into the skin at bedtime.     lisinopril-hydrochlorothiazide 20-12.5 MG  tablet  Commonly known as:  PRINZIDE,ZESTORETIC  Take 1 tablet by mouth daily.     metFORMIN 1000 MG tablet  Commonly known as:  GLUCOPHAGE  Take 1,000 mg by mouth 2 (two) times daily with a meal.     methocarbamol 500 MG tablet  Commonly known as:  ROBAXIN  Take 1 tablet (500 mg total) by mouth every 6 (six) hours as needed for muscle spasms.     metoCLOPramide 5 MG tablet  Commonly known as:  REGLAN  Take 1 tablet (5 mg total) by mouth every 8 (eight) hours as needed for nausea (if ondansetron (ZOFRAN) ineffective.).     ondansetron 4 MG tablet  Commonly known as:  ZOFRAN  Take 1 tablet (4 mg total) by mouth every 6 (six) hours as needed for nausea.     oxybutynin 5 MG tablet  Commonly known as:  DITROPAN  Take 5 mg by mouth 2 (two) times daily.     oxyCODONE 5 MG immediate release tablet  Commonly known as:  Oxy IR/ROXICODONE  Take 1-2 tablets (5-10 mg total) by mouth every 3 (three) hours as needed for moderate pain or severe pain.     pantoprazole 40 MG tablet  Commonly known as:  PROTONIX  Take 1 tablet (40 mg total) by mouth daily at 12 noon.     polyethylene glycol packet  Commonly known as:  MIRALAX / GLYCOLAX  Take 17 g by mouth daily as needed for mild constipation.     potassium chloride SA 20 MEQ tablet  Commonly known as:  K-DUR,KLOR-CON  Take 20 mEq by mouth 2 (two) times daily as needed (take with lasix).     simvastatin 40 MG tablet  Commonly known as:  ZOCOR  Take 40 mg by mouth at bedtime.     traMADol 50 MG tablet  Commonly known as:  ULTRAM  Take 1-2 tablets (50-100 mg total) by mouth every 6 (six) hours as needed (mild pain).     triamcinolone cream 0.1 %  Commonly known  as:  KENALOG  Apply 1 application topically daily as needed.       Follow-up Information                      Follow up with [facility].   Specialty:  Skilled Nursing Facility   Contact information:   [place]  [place] [phone]      Follow up with Quay Burow, MD.   Specialties:  Cardiology, Radiology   Why:  office will contact you with appointment   Contact information:   [address] [place] [address] [phone]       Follow up with Gearlean Alf, MD. Schedule an appointment as soon as possible for a visit on 06/18/2015.   Specialty:  Orthopedic Surgery   Why:  Call office ASAP at [phone] to setup appointment next Tuesday 06/18/2015 with Dr. Wynelle Link.   Contact information:   [address] [place] [address] [phone]       Signed: Arlee Muslim, PA-C Orthopaedic Surgery 06/11/2015, 8:58 AM

## 2015-06-11 NOTE — Progress Notes (Addendum)
Subjective: 11 Days Post-Op Procedure(s) (LRB): TOTAL LEFT  KNEE ARTHROPLASTY (Left) Patient reports pain as mild.   Patient seen in rounds with Dr. Wynelle Link. Patient is well, and has had no acute complaints or problems Patient is ready to go to the SNF if okay with Medicine  Objective: Vital signs in last 24 hours: Temp:  [98.4 F (36.9 C)-99.3 F (37.4 C)] 98.4 F (36.9 C) (01/24 0449) Pulse Rate:  [58-75] 64 (01/24 0449) Resp:  [20-22] 20 (01/24 0449) BP: (162-171)/(60-75) 162/64 mmHg (01/24 0449) SpO2:  [94 %-97 %] 96 % (01/24 0449) Weight:  [149.5 kg (329 lb 9.4 oz)] 149.5 kg (329 lb 9.4 oz) (01/24 0449)  Intake/Output from previous day:  Intake/Output Summary (Last 24 hours) at 06/11/15 0749 Last data filed at 06/11/15 [id number]  Gross per 24 hour  Intake    480 ml  Output    701 ml  Net   -221 ml    Labs:  Recent Labs  06/09/15 0607 06/10/15 0645 06/11/15 0515  HGB 9.0* 9.3* 9.2*    Recent Labs  06/10/15 0645 06/11/15 0515  WBC 11.0* 12.8*  RBC 3.23* 3.25*  HCT 30.1* 30.5*  PLT 515* 577*    Recent Labs  06/10/15 0645 06/11/15 0515  NA 141 141  K 4.2 3.8  CL 102 102  CO2 30 31  BUN 20 17  CREATININE 1.21 1.03  GLUCOSE 143* 142*  CALCIUM 8.9 9.0   No results for input(s): LABPT, INR in the last 72 hours.  EXAM: General - Patient is Alert, Appropriate and Oriented Extremity - Neurovascular intact Sensation intact distally Dorsiflexion/Plantar flexion intact Incision - clean, dry, no drainage Motor Function - intact, moving foot and toes well on exam.   Assessment/Plan: 11 Days Post-Op Procedure(s) (LRB): TOTAL LEFT  KNEE ARTHROPLASTY (Left) Procedure(s) (LRB): TOTAL LEFT  KNEE ARTHROPLASTY (Left) Past Medical History  Diagnosis Date  . Hypertension   . Diabetes mellitus   . Hyperlipidemia   . Obstructive sleep apnea     CPAP  . Peripheral arterial disease ([place])   . Venous insufficiency   . Venous stasis ulcer ([place])   . DOE  (dyspnea on exertion)     WITH MINIMAL EXERTION  . HA (headache)     WAKE UP FREQUENTLY WITH HA  . History of Doppler ultrasound 12/2012    LE VENOUS REFLUX demostrated on L (vein stripping on R w/MRSA); h/o venous stasis bilat   . Atrial fibrillation ([place])   . Heart murmur   . Dysrhythmia   . History of urinary tract infection   . Urinary incontinence   . Arthritis   . Numbness and tingling     feet bilat   . Falls    Principal Problem:   Acute respiratory failure with hypoxia (HCC) Active Problems:   Essential hypertension   Obstructive sleep apnea   Venous stasis ulcers of both lower extremities   Hypertensive urgency   S/P left knee arthroscopy   DM (diabetes mellitus) type II controlled peripheral vascular disorder (HCC)   Morbid obesity due to excess calories (HCC)   Bradycardia with 41 - 50 beats per minute   Sepsis, unspecified organism [facility])   Pulmonary vascular congestion   Postoperative anemia due to acute blood loss   Aspiration pneumonitis (HCC)   Acute on chronic diastolic (congestive) heart failure (HCC)  Estimated body mass index is 43.49 kg/(m^2) as calculated from the following:   Height as of this encounter: 6\' 1"  (  1.854 m).   Weight as of this encounter: 149.5 kg (329 lb 9.4 oz). Up with therapy Discharge to SNF if cleared to transfer Diet - Cardiac diet and Diabetic diet Follow up - in 1 week, next Tuesday 06/18/2015 Activity - WBAT Disposition - Skilled nursing facility Condition Upon Discharge - improving D/C Meds - See DC Summary DVT Prophylaxis - Eliquis  for two more weeks and then switch to a baby Aspirin 81 mg daily for three additional weeks.  Mario Muslim, PA-C Orthopaedic Surgery 06/11/2015, 7:49 AM

## 2015-06-12 ENCOUNTER — Non-Acute Institutional Stay (SKILLED_NURSING_FACILITY): Payer: Medicare Other | Admitting: Internal Medicine

## 2015-06-12 DIAGNOSIS — M1712 Unilateral primary osteoarthritis, left knee: Secondary | ICD-10-CM

## 2015-06-12 DIAGNOSIS — K5900 Constipation, unspecified: Secondary | ICD-10-CM | POA: Diagnosis not present

## 2015-06-12 DIAGNOSIS — I5032 Chronic diastolic (congestive) heart failure: Secondary | ICD-10-CM

## 2015-06-12 DIAGNOSIS — R2681 Unsteadiness on feet: Secondary | ICD-10-CM

## 2015-06-12 DIAGNOSIS — E1151 Type 2 diabetes mellitus with diabetic peripheral angiopathy without gangrene: Secondary | ICD-10-CM | POA: Diagnosis not present

## 2015-06-12 DIAGNOSIS — J189 Pneumonia, unspecified organism: Secondary | ICD-10-CM | POA: Diagnosis not present

## 2015-06-12 DIAGNOSIS — E785 Hyperlipidemia, unspecified: Secondary | ICD-10-CM

## 2015-06-12 DIAGNOSIS — G4733 Obstructive sleep apnea (adult) (pediatric): Secondary | ICD-10-CM | POA: Diagnosis not present

## 2015-06-12 DIAGNOSIS — I10 Essential (primary) hypertension: Secondary | ICD-10-CM | POA: Diagnosis not present

## 2015-06-12 DIAGNOSIS — D62 Acute posthemorrhagic anemia: Secondary | ICD-10-CM | POA: Diagnosis not present

## 2015-06-12 DIAGNOSIS — K219 Gastro-esophageal reflux disease without esophagitis: Secondary | ICD-10-CM | POA: Diagnosis not present

## 2015-06-12 DIAGNOSIS — M5440 Lumbago with sciatica, unspecified side: Secondary | ICD-10-CM

## 2015-06-12 DIAGNOSIS — D72829 Elevated white blood cell count, unspecified: Secondary | ICD-10-CM

## 2015-06-12 DIAGNOSIS — G8929 Other chronic pain: Secondary | ICD-10-CM

## 2015-06-12 NOTE — Progress Notes (Signed)
Patient ID: Mario Mills, male   DOB: 1951-01-15, 65 y.o.   MRN: [id number]     [facility]   PCP: Philis Fendt, MD  Code Status: FULL CODE  No Known Allergies  Chief Complaint  Patient presents with  . New Admit To SNF     HPI:  65 y.o. patient is here for short term rehabilitation post hospital admission from 05/31/15-06/11/15 with left knee OA. He underwent left knee arthroplasty. Post op, he became hypoxic and required biPAP, lasix and bronchodilator treatment. His cxr was suggestive of pneumonia. He was started on antibiotics. He required antihypertensive adjustment with hypertensive urgency. He has PMH of HTN, obesity, DM type 2, chronic diastolic CHF, OSA among others. He is seen in his room today. His pain is not under control with current regimen. His breathing is stable. Has some dizziness with change of position.   Review of Systems:  Constitutional: Negative for fever, chills, diaphoresis.  HENT: Negative for headache, congestion, difficulty swallowing.   Eyes: Negative for blurred vision, double vision and discharge.  Respiratory: Negative for cough, shortness of breath and wheezing.   Cardiovascular: Negative for chest pain, palpitations, leg swelling.  Gastrointestinal: Negative for heartburn, nausea, vomiting, abdominal pain. Has been constipated Genitourinary: Negative for dysuria  Musculoskeletal: positive for chronic low back pain, no falls in the facility Skin: Negative for itching, rash.  Neurological: Negative for dizziness Psychiatric/Behavioral: Negative for depression   Past Medical History  Diagnosis Date  . Hypertension   . Diabetes mellitus   . Hyperlipidemia   . Obstructive sleep apnea     CPAP  . Peripheral arterial disease ([place])   . Venous insufficiency   . Venous stasis ulcer ([place])   . DOE (dyspnea on exertion)     WITH MINIMAL EXERTION  . HA (headache)     WAKE UP FREQUENTLY WITH HA  . History of  Doppler ultrasound 12/2012    LE VENOUS REFLUX demostrated on L (vein stripping on R w/MRSA); h/o venous stasis bilat   . Atrial fibrillation ([place])   . Heart murmur   . Dysrhythmia   . History of urinary tract infection   . Urinary incontinence   . Arthritis   . Numbness and tingling     feet bilat   . Falls    Past Surgical History  Procedure Laterality Date  . Colon resection  2007  . Transthoracic echocardiogram  07/02/2005    mod concentric LVH; LV borderline dilated; LA mild-mod dilated; mild MR & TR  . Varicose vein surgery      R leg  . Colon surgery    . Tonsillectomy    . Total knee arthroplasty Left 05/31/2015    Procedure: TOTAL LEFT  KNEE ARTHROPLASTY;  Surgeon: Gaynelle Arabian, MD;  Location: [facility];  Service: Orthopedics;  Laterality: Left;   Social History:   reports that he quit smoking about 9 years ago. His smoking use included Cigarettes. He has a 35 pack-year smoking history. He has never used smokeless tobacco. He reports that he drinks alcohol. He reports that he does not use illicit drugs.  Family History  Problem Relation Age of Onset  . Pancreatic cancer Father   . Cancer Father   . Heart Problems Mother   . Hypertension Mother   . Diabetes Brother   . Hypertension Brother     Medications:   Medication List       This list is accurate as of:  06/12/15  2:52 PM.  Always use your most recent med list.               acetaminophen 325 MG tablet  Commonly known as:  TYLENOL  Take 2 tablets (650 mg total) by mouth every 6 (six) hours as needed for mild pain, fever or headache (or Fever >/= 101).     albuterol 108 (90 Base) MCG/ACT inhaler  Commonly known as:  PROVENTIL HFA;VENTOLIN HFA  Inhale 2 puffs into the lungs every 4 (four) hours as needed for wheezing or shortness of breath (cough, shortness of breath or wheezing.).     apixaban 2.5 MG Tabs tablet  Commonly known as:  ELIQUIS  Take 1 tablet (2.5 mg total) by mouth every 12 (twelve) hours.  Take twice a day for two more weeks, then discontinue the Eliquis. After completing the Eliquis treatment, then start a baby 81 mg Aspirin daily for three additional weeks.     atorvastatin 20 MG tablet  Commonly known as:  LIPITOR  Take 1 tablet (20 mg total) by mouth at bedtime.     bisacodyl 10 MG suppository  Commonly known as:  DULCOLAX  Place 1 suppository (10 mg total) rectally daily as needed for moderate constipation.     carvedilol 12.5 MG tablet  Commonly known as:  COREG  Take 12.5 mg by mouth 2 (two) times daily with a meal.     diazepam 5 MG tablet  Commonly known as:  VALIUM  Take 5 mg by mouth 2 (two) times daily.     diltiazem 360 MG 24 hr capsule  Commonly known as:  CARDIZEM CD  Take 1 capsule (360 mg total) by mouth daily.     diphenhydrAMINE 12.5 MG/5ML elixir  Commonly known as:  BENADRYL  Take 5-10 mLs (12.5-25 mg total) by mouth every 4 (four) hours as needed for itching.     docusate sodium 100 MG capsule  Commonly known as:  COLACE  Take 1 capsule (100 mg total) by mouth 2 (two) times daily.     furosemide 40 MG tablet  Commonly known as:  LASIX  Take 40 mg by mouth daily as needed for fluid.     gabapentin 300 MG capsule  Commonly known as:  NEURONTIN  Take 300 mg by mouth 2 (two) times daily.     gi cocktail Susp suspension  Take 30 mLs by mouth 3 (three) times daily as needed for indigestion. Shake well.     glimepiride 4 MG tablet  Commonly known as:  AMARYL  Take 4 mg by mouth daily before breakfast.     guaiFENesin-dextromethorphan 100-10 MG/5ML syrup  Commonly known as:  ROBITUSSIN DM  Take 5 mLs by mouth every 4 (four) hours as needed for cough.     isosorbide-hydrALAZINE 20-37.5 MG tablet  Commonly known as:  BIDIL  Take 2 tablets by mouth 3 (three) times daily.     lactulose 10 GM/15ML solution  Commonly known as:  CHRONULAC  Take 30 mLs (20 g total) by mouth 2 (two) times daily as needed for mild constipation.      levalbuterol 1.25 MG/0.5ML nebulizer solution  Commonly known as:  XOPENEX  Take 1.25 mg by nebulization every 4 (four) hours as needed for wheezing or shortness of breath.     LEVEMIR FLEXTOUCH 100 UNIT/ML Pen  Generic drug:  Insulin Detemir  Inject 20 Units into the skin at bedtime.     lisinopril-hydrochlorothiazide 20-12.5 MG tablet  Commonly known as:  PRINZIDE,ZESTORETIC  Take 1 tablet by mouth daily.     metFORMIN 1000 MG tablet  Commonly known as:  GLUCOPHAGE  Take 1,000 mg by mouth 2 (two) times daily with a meal.     methocarbamol 500 MG tablet  Commonly known as:  ROBAXIN  Take 1 tablet (500 mg total) by mouth every 6 (six) hours as needed for muscle spasms.     metoCLOPramide 5 MG tablet  Commonly known as:  REGLAN  Take 1 tablet (5 mg total) by mouth every 8 (eight) hours as needed for nausea (if ondansetron (ZOFRAN) ineffective.).     ondansetron 4 MG tablet  Commonly known as:  ZOFRAN  Take 1 tablet (4 mg total) by mouth every 6 (six) hours as needed for nausea.     oxybutynin 5 MG tablet  Commonly known as:  DITROPAN  Take 5 mg by mouth 2 (two) times daily.     oxyCODONE 5 MG immediate release tablet  Commonly known as:  Oxy IR/ROXICODONE  Take 1-2 tablets (5-10 mg total) by mouth every 3 (three) hours as needed for moderate pain or severe pain.     pantoprazole 40 MG tablet  Commonly known as:  PROTONIX  Take 1 tablet (40 mg total) by mouth daily at 12 noon.     polyethylene glycol packet  Commonly known as:  MIRALAX / GLYCOLAX  Take 17 g by mouth daily as needed for mild constipation.     potassium chloride SA 20 MEQ tablet  Commonly known as:  K-DUR,KLOR-CON  Take 20 mEq by mouth 2 (two) times daily as needed (take with lasix).     simvastatin 40 MG tablet  Commonly known as:  ZOCOR  Take 40 mg by mouth at bedtime.     traMADol 50 MG tablet  Commonly known as:  ULTRAM  Take 1-2 tablets (50-100 mg total) by mouth every 6 (six) hours as needed  (mild pain).     triamcinolone cream 0.1 %  Commonly known as:  KENALOG  Apply 1 application topically daily as needed.         Physical Exam: Filed Vitals:   06/12/15 1447  BP: 144/73  Pulse: 97  Temp: 99 F (37.2 C)  Resp: 16  SpO2: 92%    General- elderly obese, in no acute distress Head- normocephalic, atraumatic Nose- no maxillary or frontal sinus tenderness, no nasal discharge Throat- moist mucus membrane  Eyes- PERRLA, EOMI, no pallor, no icterus, no discharge, normal conjunctiva, normal sclera Neck- no cervical lymphadenopathy Cardiovascular- normal s1,s2, no murmurs, trace leg edema Respiratory- bilateral clear to auscultation, no wheeze, no rhonchi, no crackles, no use of accessory muscles Abdomen- bowel sounds present, soft, non tender Musculoskeletal- able to move all 4 extremities, limited left knee range of motion  Neurological- no focal deficit, alert and oriented to person, place and time Skin- warm and dry, left knee surgical incision with steri strips in place and healing well Psychiatry- normal mood and affect    Labs reviewed: Basic Metabolic Panel:  Recent Labs  06/09/15 0607 06/10/15 0645 06/11/15 0515  NA 140 141 141  K 3.9 4.2 3.8  CL 101 102 102  CO2 30 30 31   GLUCOSE 140* 143* 142*  BUN 23* 20 17  CREATININE 1.14 1.21 1.03  CALCIUM 8.7* 8.9 9.0   Liver Function Tests:  Recent Labs  05/24/15 1655 05/31/15 1710  AST 22 45*  ALT 26 68*  ALKPHOS 66 72  BILITOT 0.3 0.6  PROT 7.4 7.3  ALBUMIN 3.7 3.3*   No results for input(s): LIPASE, AMYLASE in the last 8760 hours. No results for input(s): AMMONIA in the last 8760 hours. CBC:  Recent Labs  06/07/15 0511  06/09/15 0607 06/10/15 0645 06/11/15 0515  WBC 12.0*  < > 11.4* 11.0* 12.8*  NEUTROABS 8.6*  --  7.8*  --  9.6*  HGB 9.1*  < > 9.0* 9.3* 9.2*  HCT 29.5*  < > 29.0* 30.1* 30.5*  MCV 92.8  < > 92.9 93.2 93.8  PLT 385  < > 475* 515* 577*  < > = values in this  interval not displayed.   Assessment/Plan  Unsteady gait Will have him work with physical therapy and occupational therapy team to help with gait training and muscle strengthening exercises.fall precautions. Skin care. Encourage to be out of bed.   Left knee OA Ds/p left knee arthroplasty. Pain not under control with current regimen. Would like to be started on dilaudid. Get physiatry consult. Will have patient work with PT/OT as tolerated to regain strength and restore function.  Fall precautions are in place. Has follow up with orthopedics. Continue eliquis for dvt prophylaxis. WBAT. To wear ted hose. Continue robaxin 500 mg q6h prn muscle spasm  HCAP S/p antibiotic treatment, clinically improved, will get repeat cxr in 3-4 weeks to assess for complete resolution, use bronchodilator rx for now  Constipation On colace 100 mg bid, prn miralax, prn lactulose and prn dulcolax suppository. Has been constipated. Change miralax to twice daily for now and reassess.  Blood loss anemia Post op, monitor cbc  Leukocytosis Afebrile, recent HCAP. monitor cbc with diff  Chronic low back pain with radiculopathy Continue valium 5 mg bid for muscle spasticity, oxyIR prn for pain and gabapentin 300 mg bid for nerve pain. To work with PT and OT.  Chronic diastolic chf Stable continue coreg 12.5 mg bid, lisinopril-hctz 20-12.5 mg daily, bidil 20-37.5 mg 2 tab tid and lasix 40 mg daily as needed with kcl supplement, check weight 3 days a week.   HTN On coreg 12.5 mg bid, diltiazem 360 mg daily, bidil 20-37.5 mg 2 tab tid, lisinopril-hctz 20-12.5 mg daily. Check bp bid for a week, monitor bmp  Dm type 2 Lab Results  Component Value Date   HGBA1C 6.8* 05/24/2015  monitor cbg. Continue metformin 1000 mg bid, levemir 20 u daily, glimeperide 4 mg daily  gerd Stable, continue protonix 40 mg daily  OSA Uses CPAP at night at home, wife to bring it  HLD Continue lipitor 20 mg qhs    Goals of care:  short term rehabilitation   Labs/tests ordered: cbc with diff, cmp  Family/ staff Communication: reviewed care plan with patient and nursing supervisor    Blanchie Serve, MD  [facility] [phone] (Monday-Friday 8 am - 5 pm) [phone] (afterhours)

## 2015-06-17 LAB — BASIC METABOLIC PANEL
BUN: 21 mg/dL (ref 4–21)
Creatinine: 1 mg/dL (ref 0.6–1.3)
GLUCOSE: 99 mg/dL
Potassium: 4 mmol/L (ref 3.4–5.3)
Sodium: 143 mmol/L (ref 137–147)

## 2015-06-17 LAB — CBC AND DIFFERENTIAL
HEMATOCRIT: 30 % — AB (ref 41–53)
HEMOGLOBIN: 9.2 g/dL — AB (ref 13.5–17.5)
NEUTROS ABS: 6 /uL
PLATELETS: 608 10*3/uL — AB (ref 150–399)
WBC: 9.4 10*3/mL

## 2015-06-26 ENCOUNTER — Ambulatory Visit (INDEPENDENT_AMBULATORY_CARE_PROVIDER_SITE_OTHER): Payer: Medicare Other | Admitting: Physician Assistant

## 2015-06-26 ENCOUNTER — Encounter: Payer: Self-pay | Admitting: Physician Assistant

## 2015-06-26 VITALS — BP 128/60 | HR 76 | Ht 73.0 in | Wt 325.0 lb

## 2015-06-26 DIAGNOSIS — I10 Essential (primary) hypertension: Secondary | ICD-10-CM | POA: Diagnosis not present

## 2015-06-26 DIAGNOSIS — Z7901 Long term (current) use of anticoagulants: Secondary | ICD-10-CM | POA: Diagnosis not present

## 2015-06-26 DIAGNOSIS — I5032 Chronic diastolic (congestive) heart failure: Secondary | ICD-10-CM | POA: Diagnosis not present

## 2015-06-26 DIAGNOSIS — I480 Paroxysmal atrial fibrillation: Secondary | ICD-10-CM | POA: Diagnosis not present

## 2015-06-26 LAB — BASIC METABOLIC PANEL
BUN: 15 mg/dL (ref 7–25)
CALCIUM: 9.6 mg/dL (ref 8.6–10.3)
CO2: 31 mmol/L (ref 20–31)
Chloride: 101 mmol/L (ref 98–110)
Creat: 1.15 mg/dL (ref 0.70–1.25)
GLUCOSE: 63 mg/dL — AB (ref 65–99)
Potassium: 3.9 mmol/L (ref 3.5–5.3)
SODIUM: 142 mmol/L (ref 135–146)

## 2015-06-26 LAB — CBC AND DIFFERENTIAL
HCT: 31 % — AB (ref 41–53)
Hemoglobin: 9.4 g/dL — AB (ref 13.5–17.5)
NEUTROS ABS: 6 /uL
Platelets: 385 10*3/uL (ref 150–399)
WBC: 8.4 10^3/mL

## 2015-06-26 NOTE — Patient Instructions (Signed)
   Lab for BMET - do today. Continue all current medications. Follow up in  3 months with Dr. Gwenlyn Found.

## 2015-06-26 NOTE — Progress Notes (Signed)
Cardiology Office Note   Date:  06/26/2015   ID:  ANNE MANEVAL, DOB 1951/04/18, MRN [id number]  PCP:  Philis Fendt, MD  Cardiologist:  Dr Stacy Gardner, PA-C   Chief Complaint  Patient presents with  . Hospitalization Follow-up    no chest pain, no shortness of breath, has edema, no pain or cramping in legs(had knee replacement pain in knee), occassional lightheadedness or dizziness    History of Present Illness: Mario Mills is a 65 y.o. male with a history of HTN, DM, HLD, OSA, PAD, PAF on Eliquis, SEM.   Admit 01/13-01/24 for L-TKR, had post-op resp failure req BiPAP, PNA, HTN and CHF. Cards was consulted to help w/ HTN and volume.   Mario Mills presents for post hospital follow-up.  He has been at [facility] for rehabilitation. His knee is doing well, but he has had significant back problems with severe shooting pain and he is unable to walk. He is for an injection on 02/10 at the spine center. He is currently not able to say if he is supposed to hold his Eliquis for this. He has not been weighed at the facility because he is unable to stand.  He denies lower extremity edema, orthopnea or PND. He has chronic dyspnea on exertion but his activity levels extremely limited because of his back. The dyspnea on exertion has not changed recently. He has not had palpitations, never feels his heart skipped or raise. He has not had presyncope or syncope. His blood pressure is well-controlled and he is compliant with his medications. His Lasix was changed to when necessary and he feels his condition is no worse since that was done. He chronically takes lisinopril/HCTZ.    Past Medical History  Diagnosis Date  . Hypertension   . Diabetes mellitus   . Hyperlipidemia   . Obstructive sleep apnea     CPAP  . Peripheral arterial disease ([place])   . Venous insufficiency   . Venous stasis ulcer ([place])   . DOE (dyspnea on exertion)     WITH MINIMAL EXERTION  . HA  (headache)     WAKE UP FREQUENTLY WITH HA  . History of Doppler ultrasound 12/2012    LE VENOUS REFLUX demostrated on L (vein stripping on R w/MRSA); h/o venous stasis bilat   . Paroxysmal atrial fibrillation ([facility])   . Heart murmur   . Dysrhythmia   . History of urinary tract infection   . Urinary incontinence   . Arthritis   . Numbness and tingling     feet bilat   . Falls     Past Surgical History  Procedure Laterality Date  . Colon resection  2007  . Transthoracic echocardiogram  07/02/2005    mod concentric LVH; LV borderline dilated; LA mild-mod dilated; mild MR & TR  . Varicose vein surgery      R leg  . Colon surgery    . Tonsillectomy    . Total knee arthroplasty Left 05/31/2015    Procedure: TOTAL LEFT  KNEE ARTHROPLASTY;  Surgeon: Gaynelle Arabian, MD;  Location: [facility];  Service: Orthopedics;  Laterality: Left;    Current Outpatient Prescriptions  Medication Sig Dispense Refill  . Alum & Mag Hydroxide-Simeth (GI COCKTAIL) SUSP suspension Take 30 mLs by mouth 3 (three) times daily as needed for indigestion. Shake well. 30 mL 0  . apixaban (ELIQUIS) 2.5 MG TABS tablet Take 1 tablet (2.5 mg total) by mouth every  12 (twelve) hours. Take twice a day for two more weeks, then discontinue the Eliquis. After completing the Eliquis treatment, then start a baby 81 mg Aspirin daily for three additional weeks. 28 tablet 0  . aspirin 81 MG tablet Take 81 mg by mouth daily.    [name] atorvastatin (LIPITOR) 20 MG tablet Take 1 tablet (20 mg total) by mouth at bedtime. 20 tablet 0  . bisacodyl (DULCOLAX) 10 MG suppository Place 1 suppository (10 mg total) rectally daily as needed for moderate constipation. 12 suppository 0  . carvedilol (COREG) 12.5 MG tablet Take 12.5 mg by mouth 2 (two) times daily with a meal.    . diazepam (VALIUM) 5 MG tablet Take 5 mg by mouth 2 (two) times daily.   2  . diltiazem (CARDIZEM CD) 360 MG 24 hr capsule Take 1 capsule (360 mg total) by mouth daily. 30 capsule 0   . docusate sodium (COLACE) 100 MG capsule Take 1 capsule (100 mg total) by mouth 2 (two) times daily. 10 capsule 0  . furosemide (LASIX) 40 MG tablet Take 40 mg by mouth daily as needed for fluid.     [name] gabapentin (NEURONTIN) 300 MG capsule Take 300 mg by mouth 2 (two) times daily.    [name] glimepiride (AMARYL) 4 MG tablet Take 4 mg by mouth daily before breakfast.    . guaiFENesin-dextromethorphan (ROBITUSSIN DM) 100-10 MG/5ML syrup Take 5 mLs by mouth every 4 (four) hours as needed for cough. 118 mL 0  . isosorbide-hydrALAZINE (BIDIL) 20-37.5 MG tablet Take 2 tablets by mouth 3 (three) times daily. 180 tablet 0  . lactulose (CHRONULAC) 10 GM/15ML solution Take 30 mLs (20 g total) by mouth 2 (two) times daily as needed for mild constipation. 240 mL 0  . levalbuterol (XOPENEX) 1.25 MG/0.5ML nebulizer solution Take 1.25 mg by nebulization every 4 (four) hours as needed for wheezing or shortness of breath. 1 each 12  . LEVEMIR FLEXTOUCH 100 UNIT/ML Pen Inject 20 Units into the skin at bedtime.  5  . lisinopril-hydrochlorothiazide (PRINZIDE,ZESTORETIC) 20-12.5 MG per tablet Take 1 tablet by mouth daily.    . metFORMIN (GLUCOPHAGE) 1000 MG tablet Take 1,000 mg by mouth 2 (two) times daily with a meal.    . methocarbamol (ROBAXIN) 500 MG tablet Take 1 tablet (500 mg total) by mouth every 6 (six) hours as needed for muscle spasms. 90 tablet 0  . oxybutynin (DITROPAN) 5 MG tablet Take 5 mg by mouth 2 (two) times daily.  5  . pantoprazole (PROTONIX) 40 MG tablet Take 1 tablet (40 mg total) by mouth daily at 12 noon. 40 tablet 0  . polyethylene glycol (MIRALAX / GLYCOLAX) packet Take 17 g by mouth daily as needed for mild constipation. 14 each 0  . potassium chloride SA (K-DUR,KLOR-CON) 20 MEQ tablet Take 20 mEq by mouth 2 (two) times daily as needed (take with lasix).     [name] simvastatin (ZOCOR) 40 MG tablet Take 40 mg by mouth at bedtime.     . traMADol (ULTRAM) 50 MG tablet Take 1-2 tablets (50-100 mg total)  by mouth every 6 (six) hours as needed (mild pain). 80 tablet 1  . triamcinolone cream (KENALOG) 0.1 % Apply 1 application topically daily as needed.  1  . acetaminophen (TYLENOL) 325 MG tablet Take 2 tablets (650 mg total) by mouth every 6 (six) hours as needed for mild pain, fever or headache (or Fever >/= 101). 60 tablet 0   No current facility-administered medications  for this visit.    Allergies:   Review of patient's allergies indicates no known allergies.    Social History:  The patient  reports that he quit smoking about 9 years ago. His smoking use included Cigarettes. He has a 35 pack-year smoking history. He has never used smokeless tobacco. He reports that he drinks alcohol. He reports that he does not use illicit drugs.   Family History:  The patient's family history includes Cancer in his father; Diabetes in his brother; Heart Problems in his mother; Hypertension in his brother and mother; Pancreatic cancer in his father.    ROS:  Please see the history of present illness. All other systems are reviewed and negative.    PHYSICAL EXAM: VS:  BP 128/60 mmHg  Pulse 76  Ht 6\' 1"  (1.854 m)  Wt 325 lb (147.419 kg)  BMI 42.89 kg/m2 , BMI Body mass index is 42.89 kg/(m^2). GEN: Well nourished, well developed, male in no acute distress HEENT: normal for age  Neck: no JVD, no carotid bruit, no masses; negative hepatojugular reflux Cardiac: RRR; 2/6 murmur, no rubs, or gallops Respiratory:  clear to auscultation bilaterally, normal work of breathing GI: soft, nontender, nondistended, + BS MS: no deformity or atrophy; no edema; distal pulses are 2+ in all 4 extremities  Skin: warm and dry, no rash Neuro:  Strength and sensation are intact Psych: euthymic mood, full affect   EKG:  EKG is ordered today. The ekg ordered today demonstrates sinus rhythm, rate 75 with frequent PVCs, no acute ischemic changes  ECHO: 05/15/2015 - Left ventricle: The cavity size was normal. There was  moderate concentric hypertrophy. Systolic function was normal. The estimated ejection fraction was in the range of 50% to 55%. Wall motion was normal; there were no regional wall motion abnormalities. - Aortic valve: There was mild stenosis. - Left atrium: The atrium was severely dilated. - Right ventricle: The cavity size was normal. Wall thickness was normal. Systolic function was normal.  Recent Labs: 05/31/2015: ALT 68* 06/11/2015: BUN 17; Creatinine, Ser 1.03; Hemoglobin 9.2*; Platelets 577*; Potassium 3.8; Sodium 141    Lipid Panel No results found for: CHOL, TRIG, HDL, CHOLHDL, VLDL, LDLCALC, LDLDIRECT   Wt Readings from Last 3 Encounters:  06/26/15 325 lb (147.419 kg)  06/11/15 329 lb 9.4 oz (149.5 kg)  05/24/15 325 lb 11.2 oz (147.737 kg)     Other studies Reviewed: Additional studies/ records that were reviewed today include: Office Notes, hospital records.  ASSESSMENT AND PLAN:  1.  Chronic diastolic CHF: Currently he has no signs or symptoms of volume overload. He was advised that he needs to weigh himself daily. He is to take the when necessary Lasix for 3 pound gain in a day or 5 pounds in a week. Once his back has been treated, he should be able to start doing that. At one point during his hospitalization, weight was lower than it is now. However, generally, it has been as high as it is right now or higher.  Heart-healthy lifestyle is encouraged. Daily weights once he is able to stand. He complains of his mouth being extremely dry, so we will check a BMET today to make sure his renal function is okay.  2. PAF: His heart is regular in rate and rhythm on exam. Continue carvedilol and diltiazem  3. Anticoagulation: He is taking Eliquis which he may have to hold for his spinal injection, but is generally compliant with it. He has had no recent events  and is okay to hold the Eliquis for the procedure.  4. Hypertension: His blood pressure is well-controlled  today. The patient's sister had some questions about him being on so many medications. I explained that the blood pressure medications he is on do not all work the same way and therefore he needs all of them for optimal blood pressure control.   Current medicines are reviewed at length with the patient today.  The patient does not have concerns regarding medicines.  The following changes have been made:  no change  Labs/ tests ordered today include:   Orders Placed This Encounter  Procedures  . Basic metabolic panel     Disposition:   FU with Dr. Gwenlyn Found  Signed, Lenoard Aden  06/26/2015 12:27 PM    [address], [place], [place]  [id number] Phone: [phone]; Fax: [phone]

## 2015-06-28 ENCOUNTER — Other Ambulatory Visit: Payer: Self-pay | Admitting: *Deleted

## 2015-06-28 MED ORDER — HYDROMORPHONE HCL 2 MG PO TABS: ORAL_TABLET | ORAL | Status: DC

## 2015-06-28 NOTE — Telephone Encounter (Signed)
[facility] 

## 2015-07-05 ENCOUNTER — Encounter: Payer: Self-pay | Admitting: Adult Health

## 2015-07-05 ENCOUNTER — Non-Acute Institutional Stay (SKILLED_NURSING_FACILITY): Payer: Medicare Other | Admitting: Adult Health

## 2015-07-05 DIAGNOSIS — E785 Hyperlipidemia, unspecified: Secondary | ICD-10-CM | POA: Diagnosis not present

## 2015-07-05 DIAGNOSIS — D62 Acute posthemorrhagic anemia: Secondary | ICD-10-CM

## 2015-07-05 DIAGNOSIS — I5032 Chronic diastolic (congestive) heart failure: Secondary | ICD-10-CM

## 2015-07-05 DIAGNOSIS — K219 Gastro-esophageal reflux disease without esophagitis: Secondary | ICD-10-CM

## 2015-07-05 DIAGNOSIS — G4733 Obstructive sleep apnea (adult) (pediatric): Secondary | ICD-10-CM

## 2015-07-05 DIAGNOSIS — E1151 Type 2 diabetes mellitus with diabetic peripheral angiopathy without gangrene: Secondary | ICD-10-CM | POA: Diagnosis not present

## 2015-07-05 DIAGNOSIS — K5900 Constipation, unspecified: Secondary | ICD-10-CM

## 2015-07-05 DIAGNOSIS — M5440 Lumbago with sciatica, unspecified side: Secondary | ICD-10-CM

## 2015-07-05 DIAGNOSIS — I10 Essential (primary) hypertension: Secondary | ICD-10-CM

## 2015-07-05 DIAGNOSIS — M1712 Unilateral primary osteoarthritis, left knee: Secondary | ICD-10-CM | POA: Diagnosis not present

## 2015-07-05 DIAGNOSIS — G8929 Other chronic pain: Secondary | ICD-10-CM

## 2015-07-05 DIAGNOSIS — E876 Hypokalemia: Secondary | ICD-10-CM | POA: Diagnosis not present

## 2015-07-05 NOTE — Progress Notes (Signed)
Patient ID: Mario Mills, male   DOB: [birth year], 65 y.o.   MRN: [id number]    DATE:  07/05/15  MRN:  [id number]  BIRTHDAY: [birth year]  Facility:  Nursing Home Location:  [place] Room Number: 908-P  LEVEL OF CARE:  SNF [phone]  Contact Information    Name Relation Home Work [place] Spouse [phone]  [phone]   Pascale,Carmie Brother   [phone]       Code Status History    Date Active Date Inactive Code Status Order ID Comments User Context   05/31/2015  3:26 PM 06/11/2015  6:53 PM Full Code [id number]  Gaynelle Arabian, MD Inpatient       Chief Complaint  Patient presents with  . Medical Management of Chronic Issues    HISTORY OF PRESENT ILLNESS:  This is a 65 year old male who is being seen for a routine visit. He has been complaining of shooting low back pains causing him to have weakness in both legs. Patient reported that he has already had a recent MRI done @ [facility]. He was recently seen @ [facility] and report said that lumbar spinal stenosis is not new. His pain medication has been adjusted and is now seen by physiatry. He is able to walk with a walker and stand-by assistance  with short distances only. He has OSA but prefers not to use his CPAP. He was recently started on Procel due to albumin 2.84.  He has been admitted to [facility] on 06/11/15 from [facility] with osteoarthritis for which she had left total knee arthroplasty on 05/31/15. Postop was complicated by hypoxia requiring BiPAP, Lasix and bronchodilator treatment. Chest x-ray was suggestive of pneumonia and he was given antibiotics. His antihypertensive medication was adjusted due to hypertensive urgency. He has PMH of hypertension, obesity, diabetes mellitus type 2, chronic diastolic CHF and OSA. He has been admitted for a short-term rehabilitation.  PAST MEDICAL HISTORY:  Past Medical History  Diagnosis Date  .  Hypertension   . Diabetes mellitus   . Hyperlipidemia   . Obstructive sleep apnea     CPAP  . Peripheral arterial disease ([place])   . Venous insufficiency   . Venous stasis ulcer ([place])   . DOE (dyspnea on exertion)     WITH MINIMAL EXERTION  . HA (headache)     WAKE UP FREQUENTLY WITH HA  . History of Doppler ultrasound 12/2012    LE VENOUS REFLUX demostrated on L (vein stripping on R w/MRSA); h/o venous stasis bilat   . Paroxysmal atrial fibrillation (HCC)   . Heart murmur   . Dysrhythmia   . History of urinary tract infection   . Urinary incontinence   . Arthritis   . Numbness and tingling     feet bilat   . Falls   . Chronic anticoagulation     Eliquis     CURRENT MEDICATIONS: Reviewed  Patient's Medications  New Prescriptions   No medications on file  Previous Medications   ACETAMINOPHEN (TYLENOL) 325 MG TABLET    Take 2 tablets (650 mg total) by mouth every 6 (six) hours as needed for mild pain, fever or headache (or Fever >/= 101).   ALBUTEROL (PROVENTIL HFA;VENTOLIN HFA) 108 (90 BASE) MCG/ACT INHALER    Inhale 2 puffs into the lungs every 4 (four) hours as needed for wheezing or shortness of breath.   APIXABAN (ELIQUIS) 2.5 MG TABS TABLET  Take 2.5 mg by mouth 2 (two) times daily. Take one (1) tablet by mouth every 12 hours for 2 more weeks, then discontinue.  Discontinue date 07/12/15   ASPIRIN 81 MG TABLET    Take 81 mg by mouth daily. To start after Eliquis   ATORVASTATIN (LIPITOR) 20 MG TABLET    Take 1 tablet (20 mg total) by mouth at bedtime.   BISACODYL (DULCOLAX) 10 MG SUPPOSITORY    Place 1 suppository (10 mg total) rectally daily as needed for moderate constipation.   CARVEDILOL (COREG) 12.5 MG TABLET    Take 12.5 mg by mouth 2 (two) times daily with a meal.   DIAZEPAM (VALIUM) 5 MG TABLET    Take 5 mg by mouth 2 (two) times daily.    DILTIAZEM (CARDIZEM CD) 360 MG 24 HR CAPSULE    Take 1 capsule (360 mg total) by mouth daily.   DIPHENHYDRAMINE (BENADRYL) 12.5  MG/5ML ELIXIR    Take 12.5-25 mg by mouth 4 (four) times daily as needed for itching.   DOCUSATE SODIUM (COLACE) 100 MG CAPSULE    Take 1 capsule (100 mg total) by mouth 2 (two) times daily.   FUROSEMIDE (LASIX) 40 MG TABLET    Take 40 mg by mouth daily as needed for fluid.    GABAPENTIN (NEURONTIN) 300 MG CAPSULE    Take 300 mg by mouth 2 (two) times daily.   GLIMEPIRIDE (AMARYL) 4 MG TABLET    Take 4 mg by mouth daily before breakfast.   GUAIFENESIN-DEXTROMETHORPHAN (ROBITUSSIN DM) 100-10 MG/5ML SYRUP    Take 5 mLs by mouth every 4 (four) hours as needed for cough.   HYDROMORPHONE (DILAUDID) 2 MG TABLET    Take 2-4 mg by mouth. One (1) tablet po at 2PM PRN for pain and before therapy. Take 2 tabs po at 8AM and 8PM, hold for sedation   ISOSORBIDE-HYDRALAZINE (BIDIL) 20-37.5 MG TABLET    Take 2 tablets by mouth 3 (three) times daily.   LACTULOSE (CHRONULAC) 10 GM/15ML SOLUTION    Take 30 mLs (20 g total) by mouth 2 (two) times daily as needed for mild constipation.   LEVALBUTEROL (XOPENEX) 1.25 MG/0.5ML NEBULIZER SOLUTION    Take 1.25 mg by nebulization every 4 (four) hours as needed for wheezing or shortness of breath.   LEVEMIR FLEXTOUCH 100 UNIT/ML PEN    Inject 20 Units into the skin at bedtime.   LISINOPRIL-HYDROCHLOROTHIAZIDE (PRINZIDE,ZESTORETIC) 20-12.5 MG PER TABLET    Take 1 tablet by mouth daily.   METFORMIN (GLUCOPHAGE) 1000 MG TABLET    Take 1,000 mg by mouth 2 (two) times daily with a meal.   METHOCARBAMOL (ROBAXIN) 500 MG TABLET    Take 1 tablet (500 mg total) by mouth every 6 (six) hours as needed for muscle spasms.   METOCLOPRAMIDE (REGLAN) 5 MG TABLET    Take 5 mg by mouth every 8 (eight) hours as needed for nausea (If Zofran is not effective).   ONDANSETRON (ZOFRAN) 4 MG TABLET    Take 4 mg by mouth every 6 (six) hours.   OXYBUTYNIN (DITROPAN) 5 MG TABLET    Take 5 mg by mouth 2 (two) times daily.   PANTOPRAZOLE (PROTONIX) 40 MG TABLET    Take 1 tablet (40 mg total) by mouth daily  at 12 noon.   POLYETHYLENE GLYCOL (MIRALAX / GLYCOLAX) PACKET    Take 17 g by mouth 2 (two) times daily.   POTASSIUM CHLORIDE SA (K-DUR,KLOR-CON) 20 MEQ TABLET  Take 20 mEq by mouth 2 (two) times daily as needed (take with lasix).    PROTEIN (PROCEL 100) POWD    Take 2 scoop by mouth 2 (two) times daily.   TRAMADOL (ULTRAM) 50 MG TABLET    Take 1-2 tablets (50-100 mg total) by mouth every 6 (six) hours as needed (mild pain).   TRIAMCINOLONE CREAM (KENALOG) 0.1 %    Apply 1 application topically daily as needed.  Modified Medications   No medications on file  Discontinued Medications   No medications on file     No Known Allergies   REVIEW OF SYSTEMS:  GENERAL: no change in appetite, no fatigue, no weight changes, no fever, chills or weakness EYES: Denies change in vision, dry eyes, eye pain, itching or discharge EARS: Denies change in hearing, ringing in ears, or earache NOSE: Denies nasal congestion or epistaxis MOUTH and THROAT: Denies oral discomfort, gingival pain or bleeding, pain from teeth or hoarseness   RESPIRATORY: no cough, SOB, DOE, wheezing, hemoptysis CARDIAC: no chest pain, or palpitations GI: no abdominal pain, diarrhea, constipation, heart burn, nausea or vomiting GU: Denies dysuria, frequency, hematuria, incontinence, or discharge PSYCHIATRIC: Denies feeling of depression or anxiety. No report of hallucinations, insomnia, paranoia, or agitation   PHYSICAL EXAMINATION  GENERAL APPEARANCE: Well nourished. In no acute distress. Morbidly obese. SKIN:  Skin is warm and dry. Left knee incision is healed HEAD: Normal in size and contour. No evidence of trauma EYES: Lids open and close normally. No blepharitis, entropion or ectropion. PERRL. Conjunctivae are clear and sclerae are white. Lenses are without opacity EARS: Pinnae are normal. Patient hears normal voice tunes of the examiner MOUTH and THROAT: Lips are without lesions. Oral mucosa is moist and without  lesions. Tongue is normal in shape, size, and color and without lesions NECK: supple, trachea midline, no neck masses, no thyroid tenderness, no thyromegaly LYMPHATICS: no LAN in the neck, no supraclavicular LAN RESPIRATORY: breathing is even & unlabored, BS CTAB CARDIAC: RRR,+ murmur,no extra heart sounds, no edema GI: abdomen soft, normal BS, no masses, no tenderness, no hepatomegaly, no splenomegaly EXTREMITIES:  Able to move 4 extremities PSYCHIATRIC: Alert and oriented X 3. Affect and behavior are appropriate  LABS/RADIOLOGY: Labs reviewed: Basic Metabolic Panel:  Recent Labs  06/10/15 0645 06/11/15 0515 06/26/15 1304  NA 141 141 142  K 4.2 3.8 3.9  CL 102 102 101  CO2 30 31 31   GLUCOSE 143* 142* 63*  BUN 20 17 15   CREATININE 1.21 1.03 1.15  CALCIUM 8.9 9.0 9.6   Liver Function Tests:  Recent Labs  05/24/15 1655 05/31/15 1710  AST 22 45*  ALT 26 68*  ALKPHOS 66 72  BILITOT 0.3 0.6  PROT 7.4 7.3  ALBUMIN 3.7 3.3*   CBC:  Recent Labs  06/07/15 0511  06/09/15 0607 06/10/15 0645 06/11/15 0515  WBC 12.0*  < > 11.4* 11.0* 12.8*  NEUTROABS 8.6*  --  7.8*  --  9.6*  HGB 9.1*  < > 9.0* 9.3* 9.2*  HCT 29.5*  < > 29.0* 30.1* 30.5*  MCV 92.8  < > 92.9 93.2 93.8  PLT 385  < > 475* 515* 577*  < > = values in this interval not displayed. CBG:  Recent Labs  06/10/15 2111 06/11/15 0750 06/11/15 1154  GLUCAP 196* 152* 169*     ASSESSMENT/PLAN:  Left knee osteoarthritis S/P left total knee arthroplasty - continue rehabilitation to improve strength; continue Tylenol 325 mg 2 tabs= 650 mg  by mouth every 6 hours when necessary, Dilaudid 2 mg 2 tabs every 8 AM and 8 PM, Dilaudid 2 mg at 2 PM PRN, tramadol 50 mg 1-2 tabs by mouth every 6 hours when necessary for pain; Robaxin 500 mg 1 tab by mouth every 6 hours when necessary for muscle sp sm; LLE WBAT; follow-up with orthopedics  Anemia, acute blood loss - hemoglobin 9.2; recheck hemoglobin 9.4, stable  Chronic  low back pain with radiculopathy - continue volume 5 mg 1 tab by mouth twice a day for muscle spasm, gabapentin 300 mg twice a day and Dilaudid 2 mg 2 tabs at 8 AM and 8 PM   Constipation -  continue Colace 100 mg 1 capsule by mouth twice a day, MiraLAX 17 g by mouth twice a day, lactulose when necessary and Dulcolax suppository when necessary and 2 mg at 2 PM when necessary  Hyperlipidemia - continue atorvastatin 20 mg 1 tab by mouth daily at bedtime  OSA - prefers not to use his own CPAP and reports not having problems sleeping  Chronic diastolic CHF - no SOB; continue Coreg 12.5 mg twice a day, lisinopril-HCTZ 20-12 0.5 mg by mouth daily, BiDil 20-30 7.5 mg 2 tabs by mouth 3 times a day and Lasix 40 mg 1 tab by mouth daily as needed  Hypokalemia - K 4.0 normal; continue KCl ER 20 meq 1 tab by mouth twice a day  Hypertension - continue Coreg 12.5 mg by mouth twice a day, diltiazem ER 360 mg daily, BiDil 20-30 7.5 mg 2 tabs by mouth 3 times a day and lisinopril HCTZ 20-12 0.5 mg daily  Diabetes mellitus, type II - hemoglobin A1c 6.8; continue metformin 1000 mg 1 tab by mouth twice a day, glimepiride 4 mg 1 tab by mouth daily and Levemir 20 units subcutaneous daily at bedtime; continue CBG monitoring  GERD - stable; continue Protonix 40 mg 1 tab by mouth daily      Goals of care:  Short-term rehabilitation    [facility], [facility] [phone]

## 2015-07-22 LAB — BASIC METABOLIC PANEL
BUN: 15 mg/dL (ref 4–21)
CREATININE: 1 mg/dL (ref 0.6–1.3)
GLUCOSE: 181 mg/dL
POTASSIUM: 3.5 mmol/L (ref 3.4–5.3)
SODIUM: 143 mmol/L (ref 137–147)

## 2015-07-22 LAB — CBC AND DIFFERENTIAL
HEMATOCRIT: 38 % — AB (ref 41–53)
Hemoglobin: 11.4 g/dL — AB (ref 13.5–17.5)
Neutrophils Absolute: 6 /uL
Platelets: 395 10*3/uL (ref 150–399)
WBC: 8.4 10*3/mL

## 2015-07-29 ENCOUNTER — Encounter: Payer: Self-pay | Admitting: Adult Health

## 2015-07-29 ENCOUNTER — Non-Acute Institutional Stay (SKILLED_NURSING_FACILITY): Payer: Medicare Other | Admitting: Adult Health

## 2015-07-29 DIAGNOSIS — M1712 Unilateral primary osteoarthritis, left knee: Secondary | ICD-10-CM | POA: Diagnosis not present

## 2015-07-29 DIAGNOSIS — I5032 Chronic diastolic (congestive) heart failure: Secondary | ICD-10-CM

## 2015-07-29 DIAGNOSIS — K219 Gastro-esophageal reflux disease without esophagitis: Secondary | ICD-10-CM | POA: Diagnosis not present

## 2015-07-29 DIAGNOSIS — E876 Hypokalemia: Secondary | ICD-10-CM

## 2015-07-29 DIAGNOSIS — E785 Hyperlipidemia, unspecified: Secondary | ICD-10-CM

## 2015-07-29 DIAGNOSIS — E1151 Type 2 diabetes mellitus with diabetic peripheral angiopathy without gangrene: Secondary | ICD-10-CM | POA: Diagnosis not present

## 2015-07-29 DIAGNOSIS — D62 Acute posthemorrhagic anemia: Secondary | ICD-10-CM

## 2015-07-29 DIAGNOSIS — K5900 Constipation, unspecified: Secondary | ICD-10-CM | POA: Diagnosis not present

## 2015-07-29 DIAGNOSIS — I10 Essential (primary) hypertension: Secondary | ICD-10-CM

## 2015-07-29 DIAGNOSIS — M5440 Lumbago with sciatica, unspecified side: Secondary | ICD-10-CM | POA: Diagnosis not present

## 2015-07-29 DIAGNOSIS — G8929 Other chronic pain: Secondary | ICD-10-CM | POA: Diagnosis not present

## 2015-07-29 NOTE — Progress Notes (Signed)
Patient ID: Mario Mills, male   DOB: Sep 15, 1950, 65 y.o.   MRN: [id number]    DATE:  07/29/15  MRN:  [id number]  BIRTHDAY: Jul 10, 1950  Facility:  Nursing Home Location:  [place] Room Number: 908-P  LEVEL OF CARE:  SNF [phone])  Contact Information    Name Relation Home Work [place] Spouse [phone]  [phone]   Winegar,Carmie Brother   [phone]       Code Status History    Date Active Date Inactive Code Status Order ID Comments User Context   05/31/2015  3:26 PM 06/11/2015  6:53 PM Full Code [id number]  Gaynelle Arabian, MD Inpatient       Chief Complaint  Patient presents with  . Medical Management of Chronic Issues    HISTORY OF PRESENT ILLNESS:  This is a 65 year old male who is being seen for a routine visit. He verbalized that he feels better and able to walk short distances. He recently started Fexeril TID and Valium @ HS for muscle spasm. Latest K 3.5 and continues to take K supplementation. BP review - 164/71, 187/73, 144/63 and 190/80, elevated. No complaints of headache nor dizziness.  He has been admitted to [facility] on 06/11/15 from [facility] with osteoarthritis for which she had left total knee arthroplasty on 05/31/15. Postop was complicated by hypoxia requiring BiPAP, Lasix and bronchodilator treatment. Chest x-ray was suggestive of pneumonia and he was given antibiotics. His antihypertensive medication was adjusted due to hypertensive urgency. He has PMH of hypertension, obesity, diabetes mellitus type 2, chronic diastolic CHF and OSA. He has been admitted for a short-term rehabilitation.  PAST MEDICAL HISTORY:  Past Medical History  Diagnosis Date  . Hypertension   . DM (diabetes mellitus) type II controlled peripheral vascular disorder ([place])   . Hyperlipidemia   . Obstructive sleep apnea     CPAP  . Peripheral arterial disease ([place])   . Venous insufficiency   . Venous stasis ulcer ([place])    . DOE (dyspnea on exertion)     WITH MINIMAL EXERTION  . HA (headache)     WAKE UP FREQUENTLY WITH HA  . History of Doppler ultrasound 12/2012    LE VENOUS REFLUX demostrated on L (vein stripping on R w/MRSA); h/o venous stasis bilat   . Paroxysmal atrial fibrillation (HCC)   . Heart murmur   . Dysrhythmia   . History of urinary tract infection   . Urinary incontinence   . Arthritis   . Numbness and tingling     feet bilat   . Falls   . Chronic anticoagulation     Eliquis  . Primary osteoarthritis of left knee   . Acute blood loss anemia   . Chronic diastolic heart failure ([place])   . GERD (gastroesophageal reflux disease)   . Hypokalemia   . Chronic low back pain with sciatica      CURRENT MEDICATIONS: Reviewed  Patient's Medications  New Prescriptions   No medications on file  Previous Medications   ACETAMINOPHEN (TYLENOL) 325 MG TABLET    Take 2 tablets (650 mg total) by mouth every 6 (six) hours as needed for mild pain, fever or headache (or Fever >/= 101).   ASPIRIN 81 MG CHEWABLE TABLET    Chew 81 mg by mouth daily.   ATORVASTATIN (LIPITOR) 20 MG TABLET    Take 1 tablet (20 mg total) by mouth at bedtime.   BISACODYL (DULCOLAX) 10  MG SUPPOSITORY    Place 1 suppository (10 mg total) rectally daily as needed for moderate constipation.   CARVEDILOL (COREG) 12.5 MG TABLET    Take 12.5 mg by mouth 2 (two) times daily with a meal.   CYCLOBENZAPRINE (FLEXERIL) 10 MG TABLET    Take 10 mg by mouth 3 (three) times daily.   DIAZEPAM (VALIUM) 5 MG TABLET    Take 5 mg by mouth daily. Take at 8PM   DILTIAZEM (CARDIZEM CD) 360 MG 24 HR CAPSULE    Take 1 capsule (360 mg total) by mouth daily.   DIPHENHYDRAMINE (BENADRYL) 12.5 MG/5ML ELIXIR    Take 12.5-25 mg by mouth 4 (four) times daily as needed for itching.   DOCUSATE SODIUM (COLACE) 100 MG CAPSULE    Take 1 capsule (100 mg total) by mouth 2 (two) times daily.   FUROSEMIDE (LASIX) 40 MG TABLET    Take 40 mg by mouth daily as needed  for fluid.    GABAPENTIN (NEURONTIN) 300 MG CAPSULE    Take 300 mg by mouth 2 (two) times daily.   GLIMEPIRIDE (AMARYL) 4 MG TABLET    Take 4 mg by mouth daily before breakfast.   HYDROMORPHONE (DILAUDID) 2 MG TABLET    Take 2-4 mg by mouth. One (1) tablet po at 2PM PRN for pain and before therapy. Take 2 tabs po at 8AM and 8PM, hold for sedation   ISOSORBIDE-HYDRALAZINE (BIDIL) 20-37.5 MG TABLET    Take 2 tablets by mouth 3 (three) times daily.   LACTULOSE (CHRONULAC) 10 GM/15ML SOLUTION    Take 30 mLs (20 g total) by mouth 2 (two) times daily as needed for mild constipation.   LEVEMIR FLEXTOUCH 100 UNIT/ML PEN    Inject 20 Units into the skin at bedtime.   LISINOPRIL-HYDROCHLOROTHIAZIDE (PRINZIDE,ZESTORETIC) 20-12.5 MG PER TABLET    Take 1 tablet by mouth daily.   METFORMIN (GLUCOPHAGE) 1000 MG TABLET    Take 1,000 mg by mouth 2 (two) times daily with a meal.   METOCLOPRAMIDE (REGLAN) 5 MG TABLET    Take 5 mg by mouth every 8 (eight) hours as needed for nausea (If Zofran is not effective).   ONDANSETRON (ZOFRAN) 4 MG TABLET    Take 4 mg by mouth every 6 (six) hours.   OXYBUTYNIN (DITROPAN) 5 MG TABLET    Take 5 mg by mouth 2 (two) times daily.   PANTOPRAZOLE (PROTONIX) 40 MG TABLET    Take 1 tablet (40 mg total) by mouth daily at 12 noon.   POLYETHYLENE GLYCOL (MIRALAX / GLYCOLAX) PACKET    Take 17 g by mouth 2 (two) times daily.   POTASSIUM CHLORIDE SA (K-DUR,KLOR-CON) 20 MEQ TABLET    Take 20 mEq by mouth 2 (two) times daily as needed (take with lasix).    PROTEIN (PROCEL 100) POWD    Take 2 scoop by mouth 2 (two) times daily.   TRAMADOL (ULTRAM) 50 MG TABLET    Take 1-2 tablets (50-100 mg total) by mouth every 6 (six) hours as needed (mild pain).   TRIAMCINOLONE CREAM (KENALOG) 0.1 %    Apply 1 application topically daily as needed.  Modified Medications   No medications on file  Discontinued Medications   ALBUTEROL (PROVENTIL HFA;VENTOLIN HFA) 108 (90 BASE) MCG/ACT INHALER    Inhale 2  puffs into the lungs every 4 (four) hours as needed for wheezing or shortness of breath.   APIXABAN (ELIQUIS) 2.5 MG TABS TABLET    Take 2.5  mg by mouth 2 (two) times daily. Take one (1) tablet by mouth every 12 hours for 2 more weeks, then discontinue.  Discontinue date 07/12/15   ASPIRIN 81 MG TABLET    Take 81 mg by mouth daily. To start after Eliquis   GUAIFENESIN-DEXTROMETHORPHAN (ROBITUSSIN DM) 100-10 MG/5ML SYRUP    Take 5 mLs by mouth every 4 (four) hours as needed for cough.   LEVALBUTEROL (XOPENEX) 1.25 MG/0.5ML NEBULIZER SOLUTION    Take 1.25 mg by nebulization every 4 (four) hours as needed for wheezing or shortness of breath.   METHOCARBAMOL (ROBAXIN) 500 MG TABLET    Take 1 tablet (500 mg total) by mouth every 6 (six) hours as needed for muscle spasms.     No Known Allergies   REVIEW OF SYSTEMS:  GENERAL: no change in appetite, no fatigue, no weight changes, no fever, chills or weakness EYES: Denies change in vision, dry eyes, eye pain, itching or discharge EARS: Denies change in hearing, ringing in ears, or earache NOSE: Denies nasal congestion or epistaxis MOUTH and THROAT: Denies oral discomfort, gingival pain or bleeding, pain from teeth or hoarseness   RESPIRATORY: no cough, SOB, DOE, wheezing, hemoptysis CARDIAC: no chest pain, or palpitations GI: no abdominal pain, diarrhea, constipation, heart burn, nausea or vomiting GU: Denies dysuria, frequency, hematuria, incontinence, or discharge PSYCHIATRIC: Denies feeling of depression or anxiety. No report of hallucinations, insomnia, paranoia, or agitation   PHYSICAL EXAMINATION  GENERAL APPEARANCE: Well nourished. In no acute distress. Morbidly obese. SKIN:  Skin is warm and dry. Left knee incision is healed HEAD: Normal in size and contour. No evidence of trauma EYES: Lids open and close normally. No blepharitis, entropion or ectropion. PERRL. Conjunctivae are clear and sclerae are white. Lenses are without  opacity EARS: Pinnae are normal. Patient hears normal voice tunes of the examiner MOUTH and THROAT: Lips are without lesions. Oral mucosa is moist and without lesions. Tongue is normal in shape, size, and color and without lesions NECK: supple, trachea midline, no neck masses, no thyroid tenderness, no thyromegaly LYMPHATICS: no LAN in the neck, no supraclavicular LAN RESPIRATORY: breathing is even & unlabored, BS CTAB CARDIAC: RRR,+ murmur,no extra heart sounds, BLE edema 2+ GI: abdomen soft, normal BS, no masses, no tenderness, no hepatomegaly, no splenomegaly EXTREMITIES:  Able to move 4 extremities PSYCHIATRIC: Alert and oriented X 3. Affect and behavior are appropriate  LABS/RADIOLOGY: Labs reviewed: Basic Metabolic Panel:  Recent Labs  06/10/15 0645 06/11/15 0515 06/17/15 06/26/15 1304 07/22/15  NA 141 141 143 142 143  K 4.2 3.8 4.0 3.9 3.5  CL 102 102  --  101  --   CO2 30 31  --  31  --   GLUCOSE 143* 142*  --  63*  --   BUN 20 17 21 15 15   CREATININE 1.21 1.03 1.0 1.15 1.0  CALCIUM 8.9 9.0  --  9.6  --    Liver Function Tests:  Recent Labs  05/24/15 1655 05/31/15 1710  AST 22 45*  ALT 26 68*  ALKPHOS 66 72  BILITOT 0.3 0.6  PROT 7.4 7.3  ALBUMIN 3.7 3.3*   CBC:  Recent Labs  06/09/15 0607 06/10/15 0645 06/11/15 0515 06/17/15 06/26/15 07/22/15  WBC 11.4* 11.0* 12.8* 9.4 8.4 8.4  NEUTROABS 7.8*  --  9.6* 6 6 6   HGB 9.0* 9.3* 9.2* 9.2* 9.4* 11.4*  HCT 29.0* 30.1* 30.5* 30* 31* 38*  MCV 92.9 93.2 93.8  --   --   --  PLT 475* 515* 577* 608* 385 395   CBG:  Recent Labs  06/10/15 2111 06/11/15 0750 06/11/15 1154  GLUCAP 196* 152* 169*     ASSESSMENT/PLAN:  Left knee osteoarthritis S/P left total knee arthroplasty - continue rehabilitation to improve strength; continue Valium 5 mg PO Q HS and Flexeril 10 mg TID for muscle spasm; Dilaudid 2 mg 2 tabs Q 8AM and 8PM, Dilaudid 2 mg 1 Q 2PM PRN, Tylenol 325 mg 2 tabs= 650 mg by mouth every 6 hours  when necessary, Tramadol 50 mg 1-2 tabs by mouth every 6 hours when necessary for pain; LLE WBAT; follow-up with orthopedics  Diabetes mellitus, type II -  continue metformin 1000 mg 1 tab by mouth twice a day, glimepiride 4 mg 1 tab by mouth daily and Levemir 20 units subcutaneous daily at bedtime; continue CBG monitoring  GERD - stable; continue Protonix 40 mg 1 tab by mouth daily  Anemia, acute blood loss - hemoglobin 9.4; recheck hemoglobin 11.4, stable  Chronic low back pain with radiculopathy - continue Valium 5 mg 1 tab by mouth Q HS and Flexeril 10 mg TID for muscle spasm, gabapentin 300 mg twice a day and Dilaudid 2 mg 2 tabs at 8 AM and 8 PM and Dilaudid 2 mg PO Q 2 PM PRN  Protein-calorie malnutrition - continue Procel 2 scoops BID  Constipation -  continue Colace 100 mg 1 capsule by mouth twice a day, MiraLAX 17 g by mouth twice a day, lactulose when necessary and Dulcolax suppository when necessary and 2 mg at 2 PM when necessary  Hyperlipidemia - continue atorvastatin 20 mg 1 tab by mouth daily at bedtime  Chronic diastolic CHF - no SOB; continue Coreg 12.5 mg twice a day, will incerase tp lisinopril-HCTZ 20-25 mg by mouth daily, BiDil 20-30 7.5 mg 2 tabs by mouth 3 times a day and Lasix 40 mg 1 tab by mouth daily as needed  Hypokalemia - K 3.5 , stable; continue KCl ER 20 meq 1 tab by mouth twice a day  Hypertension - continue Coreg 12.5 mg by mouth twice a day, diltiazem ER 360 mg daily, BiDil 20-30 7.5 mg 2 tabs by mouth 3 times a day and will increase to lisinopril HCTZ 20-25 mg daily; BP/HR BID X 1 week; BMP in 1 week        Goals of care:  Short-term rehabilitation    [facility], NP [facility] [phone]

## 2015-07-31 ENCOUNTER — Ambulatory Visit: Payer: Medicare Other | Admitting: Podiatry

## 2015-08-07 ENCOUNTER — Encounter: Payer: Self-pay | Admitting: Adult Health

## 2015-08-07 ENCOUNTER — Non-Acute Institutional Stay (SKILLED_NURSING_FACILITY): Payer: Medicare Other | Admitting: Adult Health

## 2015-08-07 DIAGNOSIS — M1712 Unilateral primary osteoarthritis, left knee: Secondary | ICD-10-CM

## 2015-08-07 DIAGNOSIS — G8929 Other chronic pain: Secondary | ICD-10-CM

## 2015-08-07 DIAGNOSIS — E785 Hyperlipidemia, unspecified: Secondary | ICD-10-CM | POA: Diagnosis not present

## 2015-08-07 DIAGNOSIS — K219 Gastro-esophageal reflux disease without esophagitis: Secondary | ICD-10-CM

## 2015-08-07 DIAGNOSIS — K5900 Constipation, unspecified: Secondary | ICD-10-CM

## 2015-08-07 DIAGNOSIS — B372 Candidiasis of skin and nail: Secondary | ICD-10-CM | POA: Diagnosis not present

## 2015-08-07 DIAGNOSIS — M5440 Lumbago with sciatica, unspecified side: Secondary | ICD-10-CM | POA: Diagnosis not present

## 2015-08-07 DIAGNOSIS — E876 Hypokalemia: Secondary | ICD-10-CM | POA: Diagnosis not present

## 2015-08-07 DIAGNOSIS — E1151 Type 2 diabetes mellitus with diabetic peripheral angiopathy without gangrene: Secondary | ICD-10-CM | POA: Diagnosis not present

## 2015-08-07 DIAGNOSIS — I10 Essential (primary) hypertension: Secondary | ICD-10-CM | POA: Diagnosis not present

## 2015-08-07 DIAGNOSIS — I5032 Chronic diastolic (congestive) heart failure: Secondary | ICD-10-CM

## 2015-08-07 DIAGNOSIS — D62 Acute posthemorrhagic anemia: Secondary | ICD-10-CM | POA: Diagnosis not present

## 2015-08-07 DIAGNOSIS — E46 Unspecified protein-calorie malnutrition: Secondary | ICD-10-CM | POA: Diagnosis not present

## 2015-08-07 NOTE — Progress Notes (Signed)
Patient ID: Mario Mills, male   DOB: February 07, 1951, 65 y.o.   MRN: [id number]    DATE:  08/07/15  MRN:  [id number]  BIRTHDAY: 05-Mar-1951  Facility:  Nursing Home Location:  [place] Room Number: 908-P  LEVEL OF CARE:  SNF [phone])  Contact Information    Name Relation Home Work [place] Spouse [phone]  [phone]   Mowrer,Carmie Brother   [phone]       Code Status History    Date Active Date Inactive Code Status Order ID Comments User Context   05/31/2015  3:26 PM 06/11/2015  6:53 PM Full Code [id number]  Gaynelle Arabian, MD Inpatient       Chief Complaint  Patient presents with  . Discharge Note    HISTORY OF PRESENT ILLNESS:  This is a 65 year old male who is for discharge home with Home health PT, OT and CNA. DME:  Heavy-weight wheelchair, elevating leg rests, cushion, bariatric bedside commode and removable arm rest. He was noted to have erythematous rashes on bilateral buttocks.   He has been admitted to [facility] on 06/11/15 from [facility] with osteoarthritis for which he had left total knee arthroplasty on 05/31/15. Postop was complicated by hypoxia requiring BiPAP, Lasix and bronchodilator treatment. Chest x-ray was suggestive of pneumonia and he was given antibiotics. His antihypertensive medication was adjusted due to hypertensive urgency. He has PMH of hypertension, obesity, diabetes mellitus type 2, chronic diastolic CHF and OSA.   Rehabilitation was complicated by chronic low back pain. He verbalized following up with the spine clinic upon discharge.  Patient was admitted to this facility for short-term rehabilitation after the patient's recent hospitalization.  Patient has completed SNF rehabilitation and therapy has cleared the patient for discharge.    PAST MEDICAL HISTORY:  Past Medical History  Diagnosis Date  . Hypertension   . DM (diabetes mellitus) type II controlled peripheral vascular  disorder ([place])   . Hyperlipidemia   . Obstructive sleep apnea     CPAP  . Peripheral arterial disease ([place])   . Venous insufficiency   . Venous stasis ulcer ([place])   . DOE (dyspnea on exertion)     WITH MINIMAL EXERTION  . HA (headache)     WAKE UP FREQUENTLY WITH HA  . History of Doppler ultrasound 12/2012    LE VENOUS REFLUX demostrated on L (vein stripping on R w/MRSA); h/o venous stasis bilat   . Paroxysmal atrial fibrillation (HCC)   . Heart murmur   . Dysrhythmia   . History of urinary tract infection   . Urinary incontinence   . Arthritis   . Numbness and tingling     feet bilat   . Falls   . Chronic anticoagulation     Eliquis  . Primary osteoarthritis of left knee   . Acute blood loss anemia   . Chronic diastolic heart failure ([place])   . GERD (gastroesophageal reflux disease)   . Hypokalemia   . Chronic low back pain with sciatica   . Constipation   . Chronic low back pain with sciatica      CURRENT MEDICATIONS: Reviewed  Patient's Medications  New Prescriptions   No medications on file  Previous Medications   ACETAMINOPHEN (TYLENOL) 325 MG TABLET    Take 2 tablets (650 mg total) by mouth every 6 (six) hours as needed for mild pain, fever or headache (or Fever >/= 101).   ASPIRIN 81 MG CHEWABLE  TABLET    Chew 81 mg by mouth daily.   ATORVASTATIN (LIPITOR) 20 MG TABLET    Take 1 tablet (20 mg total) by mouth at bedtime.   BISACODYL (DULCOLAX) 10 MG SUPPOSITORY    Place 1 suppository (10 mg total) rectally daily as needed for moderate constipation.   CARVEDILOL (COREG) 12.5 MG TABLET    Take 12.5 mg by mouth 2 (two) times daily with a meal.   CYCLOBENZAPRINE (FLEXERIL) 10 MG TABLET    Take 10 mg by mouth 3 (three) times daily.   DIAZEPAM (VALIUM) 5 MG TABLET    Take 5 mg by mouth daily. Take at 8PM   DILTIAZEM (CARDIZEM CD) 360 MG 24 HR CAPSULE    Take 1 capsule (360 mg total) by mouth daily.   DIPHENHYDRAMINE (BENADRYL) 12.5 MG/5ML ELIXIR    Take 12.5-25 mg by  mouth 4 (four) times daily as needed for itching.   DOCUSATE SODIUM (COLACE) 100 MG CAPSULE    Take 1 capsule (100 mg total) by mouth 2 (two) times daily.   FLUCONAZOLE (DIFLUCAN) 50 MG TABLET    Take 50 mg by mouth daily. Take for 7 days   FUROSEMIDE (LASIX) 40 MG TABLET    Take 40 mg by mouth daily as needed for fluid.    GABAPENTIN (NEURONTIN) 300 MG CAPSULE    Take 300 mg by mouth 2 (two) times daily.   GLIMEPIRIDE (AMARYL) 4 MG TABLET    Take 4 mg by mouth daily before breakfast.   HYDROMORPHONE (DILAUDID) 2 MG TABLET    Take 2-4 mg by mouth. One (1) tablet po at 2PM PRN for pain and before therapy. Take 2 tabs po at 8AM and 8PM, hold for sedation   ISOSORBIDE-HYDRALAZINE (BIDIL) 20-37.5 MG TABLET    Take 2 tablets by mouth 3 (three) times daily.   LACTULOSE (CHRONULAC) 10 GM/15ML SOLUTION    Take 30 mLs (20 g total) by mouth 2 (two) times daily as needed for mild constipation.   LEVEMIR FLEXTOUCH 100 UNIT/ML PEN    Inject 20 Units into the skin at bedtime.   LISINOPRIL-HYDROCHLOROTHIAZIDE (PRINZIDE,ZESTORETIC) 20-25 MG TABLET    Take 1 tablet by mouth daily.   METFORMIN (GLUCOPHAGE) 1000 MG TABLET    Take 1,000 mg by mouth 2 (two) times daily with a meal.   METOCLOPRAMIDE (REGLAN) 5 MG TABLET    Take 5 mg by mouth every 8 (eight) hours as needed for nausea (If Zofran is not effective).   ONDANSETRON (ZOFRAN) 4 MG TABLET    Take 4 mg by mouth every 6 (six) hours.   OXYBUTYNIN (DITROPAN) 5 MG TABLET    Take 5 mg by mouth 2 (two) times daily.   PANTOPRAZOLE (PROTONIX) 40 MG TABLET    Take 1 tablet (40 mg total) by mouth daily at 12 noon.   POLYETHYLENE GLYCOL (MIRALAX / GLYCOLAX) PACKET    Take 17 g by mouth 2 (two) times daily.   POTASSIUM CHLORIDE SA (K-DUR,KLOR-CON) 20 MEQ TABLET    Take 20 mEq by mouth 2 (two) times daily as needed (take with lasix).    PROTEIN (PROCEL 100) POWD    Take 2 scoop by mouth 2 (two) times daily.   TRAMADOL (ULTRAM) 50 MG TABLET    Take 1-2 tablets (50-100 mg  total) by mouth every 6 (six) hours as needed (mild pain).   TRIAMCINOLONE CREAM (KENALOG) 0.1 %    Apply 1 application topically daily as needed.  Modified Medications  No medications on file  Discontinued Medications   LISINOPRIL-HYDROCHLOROTHIAZIDE (PRINZIDE,ZESTORETIC) 20-12.5 MG PER TABLET    Take 1 tablet by mouth daily.     No Known Allergies   REVIEW OF SYSTEMS:  GENERAL: no change in appetite, no fatigue, no weight changes, no fever, chills or weakness EYES: Denies change in vision, dry eyes, eye pain, itching or discharge EARS: Denies change in hearing, ringing in ears, or earache NOSE: Denies nasal congestion or epistaxis MOUTH and THROAT: Denies oral discomfort, gingival pain or bleeding, pain from teeth or hoarseness   RESPIRATORY: no cough, SOB, DOE, wheezing, hemoptysis CARDIAC: no chest pain, or palpitations GI: no abdominal pain, diarrhea, constipation, heart burn, nausea or vomiting GU: Denies dysuria, frequency, hematuria, incontinence, or discharge PSYCHIATRIC: Denies feeling of depression or anxiety. No report of hallucinations, insomnia, paranoia, or agitation   PHYSICAL EXAMINATION  GENERAL APPEARANCE: Well nourished. In no acute distress. Morbidly obese. SKIN:  Erythematous rashes on bilateral buttocks HEAD: Normal in size and contour. No evidence of trauma EYES: Lids open and close normally. No blepharitis, entropion or ectropion. PERRL. Conjunctivae are clear and sclerae are white. Lenses are without opacity EARS: Pinnae are normal. Patient hears normal voice tunes of the examiner MOUTH and THROAT: Lips are without lesions. Oral mucosa is moist and without lesions. Tongue is normal in shape, size, and color and without lesions NECK: supple, trachea midline, no neck masses, no thyroid tenderness, no thyromegaly LYMPHATICS: no LAN in the neck, no supraclavicular LAN RESPIRATORY: breathing is even & unlabored, BS CTAB CARDIAC: RRR,+ murmur,no extra heart  sounds, BLE edema 2+ GI: abdomen soft, normal BS, no masses, no tenderness, no hepatomegaly, no splenomegaly EXTREMITIES:  Able to move 4 extremities PSYCHIATRIC: Alert and oriented X 3. Affect and behavior are appropriate  LABS/RADIOLOGY: Labs reviewed: Basic Metabolic Panel:  Recent Labs  06/10/15 0645 06/11/15 0515 06/17/15 06/26/15 1304 07/22/15  NA 141 141 143 142 143  K 4.2 3.8 4.0 3.9 3.5  CL 102 102  --  101  --   CO2 30 31  --  31  --   GLUCOSE 143* 142*  --  63*  --   BUN 20 17 21 15 15   CREATININE 1.21 1.03 1.0 1.15 1.0  CALCIUM 8.9 9.0  --  9.6  --    Liver Function Tests:  Recent Labs  05/24/15 1655 05/31/15 1710  AST 22 45*  ALT 26 68*  ALKPHOS 66 72  BILITOT 0.3 0.6  PROT 7.4 7.3  ALBUMIN 3.7 3.3*   CBC:  Recent Labs  06/09/15 0607 06/10/15 0645 06/11/15 0515 06/17/15 06/26/15 07/22/15  WBC 11.4* 11.0* 12.8* 9.4 8.4 8.4  NEUTROABS 7.8*  --  9.6* 6 6 6   HGB 9.0* 9.3* 9.2* 9.2* 9.4* 11.4*  HCT 29.0* 30.1* 30.5* 30* 31* 38*  MCV 92.9 93.2 93.8  --   --   --   PLT 475* 515* 577* 608* 385 395   CBG:  Recent Labs  06/10/15 2111 06/11/15 0750 06/11/15 1154  GLUCAP 196* 152* 169*     ASSESSMENT/PLAN:  Left knee osteoarthritis S/P left total knee arthroplasty - for Home health PT, OT and CNA ; continue Valium 5 mg PO Q HS and Flexeril 10 mg TID for muscle spasm; Dilaudid 2 mg 2 tabs Q 8AM and 8PM, Dilaudid 2 mg 1 Q 2PM PRN, Tylenol 325 mg 2 tabs= 650 mg by mouth every 6 hours when necessary, Tramadol 50 mg 1-2 tabs by mouth every  6 hours when necessary for pain; LLE WBAT; follow-up with orthopedics  Diabetes mellitus, type II -  continue metformin 1000 mg 1 tab by mouth twice a day, glimepiride 4 mg 1 tab by mouth daily and Levemir 20 units subcutaneous daily at bedtime; continue CBG monitoring  GERD - stable; continue Protonix 40 mg 1 tab by mouth daily  Anemia, acute blood loss - hemoglobin 9.4; recheck hemoglobin 11.4, stable  Chronic  low back pain with radiculopathy - continue Valium 5 mg 1 tab by mouth Q HS and Flexeril 10 mg TID for muscle spasm, gabapentin 300 mg twice a day and Dilaudid 2 mg 2 tabs at 8 AM and 8 PM and Dilaudid 2 mg PO Q 2 PM PRN  Protein-calorie malnutrition - continue Procel 2 scoops BID  Constipation -  continue Colace 100 mg 1 capsule by mouth twice a day, MiraLAX 17 g by mouth twice a day, lactulose when necessary and Dulcolax suppository when necessary and 2 mg at 2 PM when necessary  Hyperlipidemia - continue atorvastatin 20 mg 1 tab by mouth daily at bedtime  Chronic diastolic CHF - no SOB; continue Coreg 12.5 mg twice a day, will incerase tp lisinopril-HCTZ 20-25 mg by mouth daily, BiDil 20-30 7.5 mg 2 tabs by mouth 3 times a day and Lasix 40 mg 1 tab by mouth daily as needed  Hypokalemia - K 3.5 , stable; continue KCl ER 20 meq 1 tab by mouth twice a day  Hypertension - continue Coreg 12.5 mg by mouth twice a day, diltiazem ER 360 mg daily, BiDil 20-30 7.5 mg 2 tabs by mouth 3 times a day and  lisinopril HCTZ 20-25 mg daily  Candida, skin - start Diflucan 50 mg PO Q D X 7 days     I have filled out patient's discharge paperwork and written prescriptions.  Patient will receive home health PT, OT and CNA.  DME provided:  Heavy-weight wheelchair, elevating leg rests, cushion, bariatric bedside commode and removable arm rest.  Total discharge time: Greater than 30 minutes  Discharge time involved coordination of the discharge process with social worker, nursing staff and therapy department. Medical justification for home health services/DME verified.     [facility], NP [employer] [phone]

## 2015-08-14 DIAGNOSIS — M5416 Radiculopathy, lumbar region: Secondary | ICD-10-CM

## 2015-08-14 DIAGNOSIS — E119 Type 2 diabetes mellitus without complications: Secondary | ICD-10-CM

## 2015-08-14 DIAGNOSIS — Z471 Aftercare following joint replacement surgery: Secondary | ICD-10-CM

## 2015-09-22 ENCOUNTER — Other Ambulatory Visit: Payer: Self-pay | Admitting: Adult Health

## 2015-09-24 ENCOUNTER — Encounter: Payer: Self-pay | Admitting: Cardiovascular Disease

## 2015-09-24 ENCOUNTER — Ambulatory Visit (INDEPENDENT_AMBULATORY_CARE_PROVIDER_SITE_OTHER): Payer: Medicare Other | Admitting: Cardiovascular Disease

## 2015-09-24 VITALS — BP 154/78 | HR 65 | Ht 74.0 in | Wt 291.0 lb

## 2015-09-24 DIAGNOSIS — I5032 Chronic diastolic (congestive) heart failure: Secondary | ICD-10-CM | POA: Diagnosis not present

## 2015-09-24 DIAGNOSIS — I10 Essential (primary) hypertension: Secondary | ICD-10-CM

## 2015-09-24 DIAGNOSIS — I480 Paroxysmal atrial fibrillation: Secondary | ICD-10-CM | POA: Diagnosis not present

## 2015-09-24 NOTE — Assessment & Plan Note (Signed)
History of diastolic heart failure with normal ejection fraction recently. The patient was volume overloaded post total knee replacement and was diuresed.

## 2015-09-24 NOTE — Progress Notes (Signed)
09/24/2015 Mario Mills   August 02, 1950  [id number]  Primary Physician Philis Fendt, MD Primary Cardiologist: Lorretta Harp MD Renae Gloss   HPI:  The patient is a 65 year old, severely overweight, married [place] American male with a history of hypertension, hyperlipidemia, noninsulin-requiring diabetes and obstructive sleep apnea on CPAP. I last saw him in the office 05/24/15.[name] He stopped smoking 9 years ago. I saw him last March 04, 2007. He successfully underwent colon resection after a normal 2D echo and Cardiolite stress test at that time. He is otherwise asymptomatic except for spinal stenosis. He's been stable since except for a poorly healing left pretibial venous stasis ulcer thought that Dr. Judene Companion at the [facility] wound care clinic. Arterial and venous Dopplers were obtained our office on 12/16/12 revealing a right ankle regular neck supple 95 and a left of 0.73 with a high-frequency signal in his left SFA though it is hard to elicit a clear history of claudication. Venous Dopplers to suggest venous reflux on the left. 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 He is scheduled for left total knee replacement in January. He denies chest pain but has chronic dyspnea on exertion. He had a right total knee replacement by Dr. Reynaldo Minium in January. Prior to that he had a 2-D echo which was normal and Myoview stress test that showed very mild lateral ischemia which I thought was relatively low risk. He had a somewhat protracted postoperative course with hypertensive urgency and volume overload. He was kept in place after that. He has now ambulatory and relatively asymptomatic.   Current Outpatient Prescriptions  Medication Sig Dispense Refill  . acetaminophen (TYLENOL) 325 MG tablet Take 2 tablets (650 mg total) by mouth every 6 (six) hours as needed for mild pain, fever or headache (or Fever >/= 101). 60 tablet 0  . atorvastatin (LIPITOR) 20 MG tablet Take 1  tablet (20 mg total) by mouth at bedtime. 20 tablet 0  . carvedilol (COREG) 12.5 MG tablet Take 12.5 mg by mouth 2 (two) times daily with a meal.    . cyclobenzaprine (FLEXERIL) 10 MG tablet Take 10 mg by mouth 3 (three) times daily.    . diazepam (VALIUM) 5 MG tablet Take 5 mg by mouth daily. Take at 8PM  2  . diltiazem (CARDIZEM CD) 360 MG 24 hr capsule Take 1 capsule (360 mg total) by mouth daily. 30 capsule 0  . gabapentin (NEURONTIN) 300 MG capsule Take 300 mg by mouth 2 (two) times daily.    [name] HYDROmorphone (DILAUDID) 2 MG tablet Take 2-4 mg by mouth. One (1) tablet po at 2PM PRN for pain and before therapy. Take 2 tabs po at 8AM and 8PM, hold for sedation    . isosorbide-hydrALAZINE (BIDIL) 20-37.5 MG tablet Take 2 tablets by mouth 3 (three) times daily. 180 tablet 0  . LEVEMIR FLEXTOUCH 100 UNIT/ML Pen Inject 20 Units into the skin at bedtime.  5  . lisinopril-hydrochlorothiazide (PRINZIDE,ZESTORETIC) 20-25 MG tablet Take 1 tablet by mouth daily.    . metFORMIN (GLUCOPHAGE) 1000 MG tablet Take 1,000 mg by mouth 2 (two) times daily with a meal.    . polyethylene glycol (MIRALAX / GLYCOLAX) packet Take 17 g by mouth 2 (two) times daily.    . potassium chloride SA (K-DUR,KLOR-CON) 20 MEQ tablet Take 20 mEq by mouth 2 (two) times daily as needed (take with lasix).     [name] aspirin 81 MG chewable tablet Chew 81  mg by mouth daily. Reported on 09/24/2015    . bisacodyl (DULCOLAX) 10 MG suppository Place 1 suppository (10 mg total) rectally daily as needed for moderate constipation. (Patient not taking: Reported on 09/24/2015) 12 suppository 0  . diphenhydrAMINE (BENADRYL) 12.5 MG/5ML elixir Take 12.5-25 mg by mouth 4 (four) times daily as needed for itching. Reported on 09/24/2015    . docusate sodium (COLACE) 100 MG capsule Take 1 capsule (100 mg total) by mouth 2 (two) times daily. (Patient not taking: Reported on 09/24/2015) 10 capsule 0  . fluconazole (DIFLUCAN) 50 MG tablet Take 50 mg by mouth daily.  Reported on 09/24/2015    . furosemide (LASIX) 40 MG tablet Take 40 mg by mouth daily as needed for fluid. Reported on 09/24/2015    . glimepiride (AMARYL) 4 MG tablet Take 4 mg by mouth daily before breakfast. Reported on 09/24/2015    . lactulose (CHRONULAC) 10 GM/15ML solution Take 30 mLs (20 g total) by mouth 2 (two) times daily as needed for mild constipation. (Patient not taking: Reported on 09/24/2015) 240 mL 0  . metoCLOPramide (REGLAN) 5 MG tablet Take 5 mg by mouth every 8 (eight) hours as needed for nausea (If Zofran is not effective). Reported on 09/24/2015    . ondansetron (ZOFRAN) 4 MG tablet Take 4 mg by mouth every 6 (six) hours. Reported on 09/24/2015    . oxybutynin (DITROPAN) 5 MG tablet Take 5 mg by mouth 2 (two) times daily. Reported on 09/24/2015  5  . pantoprazole (PROTONIX) 40 MG tablet Take 1 tablet (40 mg total) by mouth daily at 12 noon. (Patient not taking: Reported on 09/24/2015) 40 tablet 0  . Protein (PROCEL 100) POWD Take 2 scoop by mouth 2 (two) times daily. Reported on 09/24/2015    . traMADol (ULTRAM) 50 MG tablet Take 1-2 tablets (50-100 mg total) by mouth every 6 (six) hours as needed (mild pain). (Patient not taking: Reported on 09/24/2015) 80 tablet 1  . triamcinolone cream (KENALOG) 0.1 % Apply 1 application topically daily as needed.  1   No current facility-administered medications for this visit.    No Known Allergies  Social History   Social History  . Marital Status: Married    Spouse Name: N/A  . Number of Children: 2  . Years of Education: post grad   Occupational History  . 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 Smoking status: Former Smoker -- 1.00 packs/day for 35 years    Types: Cigarettes    Quit date: 07/25/2005  . Smokeless tobacco: Never Used  . Alcohol Use: 0.0 oz/week    0.5 Standard drinks or equivalent per week     Comment: rarely   . Drug Use: No  . Sexual Activity: Not on file   Other Topics  Concern  . Not on file   Social History Narrative     Review of Systems: General: negative for chills, fever, night sweats or weight changes.  Cardiovascular: negative for chest pain, dyspnea on exertion, edema, orthopnea, palpitations, paroxysmal nocturnal dyspnea or shortness of breath Dermatological: negative for rash Respiratory: negative for cough or wheezing Urologic: negative for hematuria Abdominal: negative for nausea, vomiting, diarrhea, bright red blood per rectum, melena, or hematemesis Neurologic: negative for visual changes, syncope, or dizziness All other systems reviewed and are otherwise negative except as noted above.    Blood pressure 154/78, pulse 65, height 6\' 2"  (1.88 m), weight  291 lb (131.997 kg).  General appearance: alert and no distress Neck: no adenopathy, no carotid bruit, no JVD, supple, symmetrical, trachea midline and thyroid not enlarged, symmetric, no tenderness/mass/nodules Lungs: clear to auscultation bilaterally Heart: regular rate and rhythm, S1, S2 normal, no murmur, click, rub or gallop Extremities: extremities normal, atraumatic, no cyanosis or edema  EKG not performed today  ASSESSMENT AND PLAN:   Essential hypertension History of hypertension with blood pressure measures 154/78. He is on carvedilol, diltiazem, hydralazine, lisinopril and hydrochlorothiazide. Continue current meds at current dosing  Chronic diastolic CHF (congestive heart failure), NYHA class 2 (HCC) History of diastolic heart failure with normal ejection fraction recently. The patient was volume overloaded post total knee replacement and was diuresed.  Paroxysmal atrial fibrillation (HCC) History of PAF currently not on oral anticoagulation      Lorretta Harp MD [facility], [facility] 09/24/2015 1:41 PM

## 2015-09-24 NOTE — Assessment & Plan Note (Signed)
History of hypertension with blood pressure measures 154/78. He is on carvedilol, diltiazem, hydralazine, lisinopril and hydrochlorothiazide. Continue current meds at current dosing

## 2015-09-24 NOTE — Assessment & Plan Note (Signed)
History of PAF currently not on oral anticoagulation

## 2015-09-24 NOTE — Patient Instructions (Signed)
Your physician recommends that you continue on your current medications as directed. Please refer to the Current Medication list given to you today.   We request that you follow-up in: [place] with an extender and in 12 MONTHS with Dr Andria Rhein will receive a reminder letter in the mail two months in advance. If you don't receive a letter, please call our office to schedule the follow-up appointment.

## 2016-03-11 ENCOUNTER — Encounter: Payer: Self-pay | Admitting: Gastroenterology

## 2016-04-08 ENCOUNTER — Ambulatory Visit (INDEPENDENT_AMBULATORY_CARE_PROVIDER_SITE_OTHER): Payer: Medicare Other | Admitting: Podiatry

## 2016-04-08 ENCOUNTER — Encounter: Payer: Self-pay | Admitting: Podiatry

## 2016-04-08 VITALS — Ht 74.0 in | Wt 291.0 lb

## 2016-04-08 DIAGNOSIS — B351 Tinea unguium: Secondary | ICD-10-CM

## 2016-04-08 DIAGNOSIS — M79675 Pain in left toe(s): Secondary | ICD-10-CM

## 2016-04-08 DIAGNOSIS — E1151 Type 2 diabetes mellitus with diabetic peripheral angiopathy without gangrene: Secondary | ICD-10-CM

## 2016-04-08 NOTE — Progress Notes (Signed)
Patient ID: Mario Mills, male   DOB: [birth year], 65 y.o.   MRN: [id number] DiComplaint:  Visit Type: Patient returns to my office for continued preventative foot care services. Complaint: Patient states" my nails have grown long and thick and become painful to walk and wear shoes" Patient has been diagnosed with DM with vascular complications. He presents for preventative foot care services. No changes to ROS  Podiatric Exam: Vascular: dorsalis pedis and posterior tibial pulses are palpable bilateral. Capillary return is immediate. Temperature gradient is WNL. Skin turgor WNL  Sensorium: Normal Semmes Weinstein monofilament test. Normal tactile sensation bilaterally. Nail Exam: Pt has thick disfigured discolored nails with subungual debris noted bilateral entire nail hallux through fifth toenails Ulcer Exam: There is no evidence of ulcer or pre-ulcerative changes or infection. Orthopedic Exam: Muscle tone and strength are WNL. No limitations in general ROM. No crepitus or effusions noted. Foot type and digits show no abnormalities. Bony prominences are unremarkable. Skin: No Porokeratosis. No infection or ulcers  Diagnosis:  Tinea unguium, Pain in right toe, pain in left toes  Treatment & Plan Procedures and Treatment: Consent by patient was obtained for treatment procedures. The patient understood the discussion of treatment and procedures well. All questions were answered thoroughly reviewed. Debridement of mycotic and hypertrophic toenails, 1 through 5 bilateral and clearing of subungual debris. No ulceration, no infection noted.  Return Visit-Office Procedure: Patient instructed to return to the office for a follow up visit 3 months for continued evaluation and treatment.abetic foot and nail care     Gardiner Barefoot DPM

## 2016-04-29 ENCOUNTER — Telehealth: Payer: Self-pay | Admitting: *Deleted

## 2016-04-29 NOTE — Telephone Encounter (Signed)
John: pt is scheduled for PV 12/26 and colonoscopy with Dr Ardis Hughs on 05/26/16.  Please review Echo report from 04/2015.  Is pt okay for [facility]?  Thanks, Juliann Pulse in [facility]

## 2016-05-01 NOTE — Telephone Encounter (Signed)
Mario Mills looked at echo and chart in [place]- states pt is okay for [place]

## 2016-05-12 ENCOUNTER — Ambulatory Visit: Payer: Medicare Other | Admitting: *Deleted

## 2016-05-12 VITALS — Ht 72.0 in | Wt 321.0 lb

## 2016-05-12 DIAGNOSIS — Z1211 Encounter for screening for malignant neoplasm of colon: Secondary | ICD-10-CM

## 2016-05-12 MED ORDER — NA SULFATE-K SULFATE-MG SULF 17.5-3.13-1.6 GM/177ML PO SOLN: 1.0000 | Freq: Once | ORAL | 0 refills | Status: AC

## 2016-05-12 NOTE — Progress Notes (Signed)
Denies allergies to eggs or soy products. Denies complications with sedation or anesthesia. Denies O2 use. Denies use of diet or weight loss medications.  Emmi instructions given for colonoscopy.  

## 2016-05-19 ENCOUNTER — Telehealth: Payer: Self-pay | Admitting: Gastroenterology

## 2016-05-19 NOTE — Telephone Encounter (Signed)
Ok, tahnks

## 2016-05-20 ENCOUNTER — Encounter: Payer: Medicare Other | Admitting: Gastroenterology

## 2016-05-26 ENCOUNTER — Encounter: Payer: Medicare Other | Admitting: Gastroenterology

## 2016-06-01 ENCOUNTER — Ambulatory Visit (INDEPENDENT_AMBULATORY_CARE_PROVIDER_SITE_OTHER): Payer: Medicare Other | Admitting: Cardiology

## 2016-06-01 ENCOUNTER — Encounter: Payer: Self-pay | Admitting: Cardiology

## 2016-06-01 VITALS — BP 209/89 | HR 70 | Ht 72.0 in | Wt 333.8 lb

## 2016-06-01 DIAGNOSIS — L97919 Non-pressure chronic ulcer of unspecified part of right lower leg with unspecified severity: Secondary | ICD-10-CM

## 2016-06-01 DIAGNOSIS — I5032 Chronic diastolic (congestive) heart failure: Secondary | ICD-10-CM

## 2016-06-01 DIAGNOSIS — I350 Nonrheumatic aortic (valve) stenosis: Secondary | ICD-10-CM

## 2016-06-01 DIAGNOSIS — G4733 Obstructive sleep apnea (adult) (pediatric): Secondary | ICD-10-CM

## 2016-06-01 DIAGNOSIS — I83019 Varicose veins of right lower extremity with ulcer of unspecified site: Secondary | ICD-10-CM

## 2016-06-01 DIAGNOSIS — I10 Essential (primary) hypertension: Secondary | ICD-10-CM | POA: Diagnosis not present

## 2016-06-01 DIAGNOSIS — L97929 Non-pressure chronic ulcer of unspecified part of left lower leg with unspecified severity: Secondary | ICD-10-CM

## 2016-06-01 DIAGNOSIS — I83029 Varicose veins of left lower extremity with ulcer of unspecified site: Secondary | ICD-10-CM

## 2016-06-01 MED ORDER — FUROSEMIDE 40 MG PO TABS: 40.0000 mg | ORAL_TABLET | Freq: Every day | ORAL | 11 refills | Status: DC

## 2016-06-01 NOTE — Assessment & Plan Note (Signed)
BMI 45 

## 2016-06-01 NOTE — Assessment & Plan Note (Signed)
By echo Dec 2016

## 2016-06-01 NOTE — Assessment & Plan Note (Signed)
Compliant with cpap

## 2016-06-01 NOTE — Assessment & Plan Note (Signed)
Repeat B/P by me 160/70

## 2016-06-01 NOTE — Assessment & Plan Note (Signed)
Worsening edema secondary to non compliance with diuretics.

## 2016-06-01 NOTE — Progress Notes (Signed)
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 He has morbid obesity and sleep apnea. He was hospitalized for knee replacement Jan 2017 and we saw him for post op respiratory failure and uncontrolled HTN. He last saw Dr Gwenlyn Found in May 2017, he was 291 lbs then. Today he complains of increasing LE edema. His wgt today is 334 lbs. He admits to LE edema but denies any dyspnea. He admits he doesn't do anything physical. He stopped taking diuretics because they interfered with his daily activities.    Current Outpatient Prescriptions  Medication Sig Dispense Refill  . acetaminophen (TYLENOL) 325 MG tablet Take 2 tablets (650 mg total) by mouth every 6 (six) hours as needed for mild pain, fever or headache (or Fever >/= 101). 60 tablet 0  . aspirin 325 MG tablet Take 325 mg by mouth daily. Reported on 09/24/2015    . atorvastatin (LIPITOR) 20 MG tablet Take 1 tablet (20 mg total) by mouth at bedtime. 20 tablet 0  . carvedilol (COREG) 12.5 MG tablet Take 12.5 mg by mouth 2 (two) times daily with a meal.    . diazepam (VALIUM) 5 MG tablet Take 5 mg by mouth daily. Take at 8PM  2  . diclofenac (VOLTAREN) 75 MG EC tablet TAKE 1 TABLET BY MOUTH TWICE A DAY AS NEEDED    . diltiazem (CARDIZEM CD) 360 MG 24 hr capsule Take 1 capsule (360 mg total) by mouth daily. 30 capsule 0  . Dutasteride-Tamsulosin HCl 0.5-0.4 MG CAPS Take by mouth.    . furosemide (LASIX) 40 MG tablet Take 1 tablet (40 mg total) by mouth daily. 30 tablet 11  . glimepiride (AMARYL) 4 MG tablet Take 4 mg by mouth daily before breakfast. Reported on 09/24/2015    . HYDROmorphone (DILAUDID) 2 MG tablet Take 2-4 mg by mouth. One (1) tablet po at 2PM PRN for pain and before therapy. Take 2 tabs po at 8AM and 8PM, hold for sedation    . LEVEMIR FLEXTOUCH 100 UNIT/ML Pen Inject 20  Units into the skin at bedtime.  5  . lisinopril-hydrochlorothiazide (PRINZIDE,ZESTORETIC) 20-25 MG tablet Take 1 tablet by mouth daily.    . metFORMIN (GLUCOPHAGE) 1000 MG tablet Take 1,000 mg by mouth 2 (two) times daily with a meal.    . potassium chloride SA (K-DUR,KLOR-CON) 20 MEQ tablet Take 20 mEq by mouth 2 (two) times daily as needed (take with lasix).     [name] simvastatin (ZOCOR) 40 MG tablet     . tolterodine (DETROL LA) 4 MG 24 hr capsule Take 4 mg by mouth.    . triamcinolone cream (KENALOG) 0.1 % Apply 1 application topically daily as needed.  1   No current facility-administered medications for this visit.     No Known Allergies  Social History   Social History  . Marital status: Married    Spouse name: N/A  . Number of children: 2  . Years of education: post grad   Occupational History  . 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 Smoking status: Former Smoker    Packs/day: 1.00    Years: 35.00    Types: Cigarettes    Quit date: 07/25/2005  . Smokeless tobacco: Never Used  . Alcohol use  0.0 oz/week    1 Standard drinks or equivalent per week     Comment: rarely   . Drug use: No  . Sexual activity: Not on file   Other Topics Concern  . Not on file   Social History Narrative  . No narrative on file     Review of Systems: General: negative for chills, fever, night sweats or weight changes.  Cardiovascular: negative for chest pain, palpitations, paroxysmal nocturnal dyspnea or shortness of breath Dermatological: negative for rash Respiratory: negative for cough or wheezing Urologic: negative for hematuria Abdominal: negative for nausea, vomiting, diarrhea, bright red blood per rectum, melena, or hematemesis Neurologic: negative for visual changes, syncope, or dizziness All other systems reviewed and are otherwise negative except as noted above.    Blood pressure (!) 209/89, pulse 70, height 6' (1.829 m), weight  (!) 333 lb 12.8 oz (151.4 kg), SpO2 97 %.  General appearance: alert, cooperative, no distress and morbidly obese Lungs: clear to auscultation bilaterally Heart: regular rate and rhythm Extremities: 2+ edema to his knees Skin: Skin color, texture, turgor normal. No rashes or lesions Neurologic: Grossly normal  EKG NSR, NSST changes  ASSESSMENT AND PLAN:   Aortic stenosis, mild By echo Dec [id number]  Chronic diastolic CHF (congestive heart failure), NYHA class 2 (HCC) EF 50-55% with moderate LVH -echo Dec 2016 The pt tells me he hasn't taken any diuretics for 2 months  Essential hypertension Repeat B/P by me 160/70  Morbid obesity due to excess calories (HCC) BMI 45  Obstructive sleep apnea Compliant with c-pap  Venous stasis ulcers of both lower extremities (HCC) Worsening edema secondary to non compliance with diuretics.    PLAN  Resume Lasix 40 mg daily. He has a follow up with Dr Jeanie Cooks later this week. We talked about the importance of medication compliance and low sodium diet. Check another echo in 6 months then f/u with Dr Gwenlyn Found.   Kerin Ransom PA-C 06/01/2016 4:11 PM

## 2016-06-01 NOTE — Patient Instructions (Signed)
Kerin Ransom, PA-C recommends that you continue on your current medications as directed. Please refer to the Current Medication list given to you today.  We have refilled your Furosemide. Please check your bottles when you get home and call us back so we may refill your Lisinopril.  Your physician has requested that you have an echocardiogram. Echocardiography is a painless test that uses sound waves to create images of your heart. It provides your doctor with information about the size and shape of your heart and how well your heart's chambers and valves are working. This procedure takes approximately one hour. There are no restrictions for this procedure. This will be performed at our [facility] location - [address].  Lurena Joiner recommends that you schedule a follow-up appointment in 6 months with Dr Gwenlyn Found. You will receive a reminder letter in the mail two months in advance. If you don't receive a letter, please call our office to schedule the follow-up appointment.  If you need a refill on your cardiac medications before your next appointment, please call your pharmacy.

## 2016-06-01 NOTE — Assessment & Plan Note (Signed)
EF 50-55% with moderate LVH -echo Dec 2016 The pt tells me he hasn't taken any diuretics for 2 months

## 2016-06-05 ENCOUNTER — Other Ambulatory Visit: Payer: Self-pay | Admitting: Internal Medicine

## 2016-06-05 DIAGNOSIS — R801 Persistent proteinuria, unspecified: Secondary | ICD-10-CM

## 2016-06-09 ENCOUNTER — Ambulatory Visit
Admission: RE | Admit: 2016-06-09 | Discharge: 2016-06-09 | Disposition: A | Discharge status: Home / Self Care | Payer: Medicare Other | Source: Ambulatory Visit | Attending: Internal Medicine | Admitting: Internal Medicine

## 2016-06-09 DIAGNOSIS — R801 Persistent proteinuria, unspecified: Secondary | ICD-10-CM

## 2016-06-17 ENCOUNTER — Encounter: Payer: Self-pay | Admitting: Podiatry

## 2016-06-17 ENCOUNTER — Ambulatory Visit (INDEPENDENT_AMBULATORY_CARE_PROVIDER_SITE_OTHER): Payer: Medicare Other | Admitting: Podiatry

## 2016-06-17 VITALS — Ht 72.0 in | Wt 333.0 lb

## 2016-06-17 DIAGNOSIS — E1151 Type 2 diabetes mellitus with diabetic peripheral angiopathy without gangrene: Secondary | ICD-10-CM

## 2016-06-17 DIAGNOSIS — M79675 Pain in left toe(s): Secondary | ICD-10-CM | POA: Diagnosis not present

## 2016-06-17 DIAGNOSIS — B351 Tinea unguium: Secondary | ICD-10-CM | POA: Diagnosis not present

## 2016-06-17 NOTE — Progress Notes (Signed)
Patient ID: Mario Mills, male   DOB: 12/24/1950, 66 y.o.   MRN: [id number] DiComplaint:  Visit Type: Patient returns to my office for continued preventative foot care services. Complaint: Patient states" my nails have grown long and thick and become painful to walk and wear shoes" Patient has been diagnosed with DM with vascular complications. He presents for preventative foot care services. No changes to ROS  Podiatric Exam: Vascular: dorsalis pedis and posterior tibial pulses are palpable bilateral. Capillary return is immediate. Temperature gradient is WNL. Skin turgor WNL  Sensorium: Normal Semmes Weinstein monofilament test. Normal tactile sensation bilaterally. Nail Exam: Pt has thick disfigured discolored nails with subungual debris noted bilateral entire nail hallux through fifth toenails Ulcer Exam: There is no evidence of ulcer or pre-ulcerative changes or infection. Orthopedic Exam: Muscle tone and strength are WNL. No limitations in general ROM. No crepitus or effusions noted. Foot type and digits show no abnormalities. Bony prominences are unremarkable. Skin: No Porokeratosis. No infection or ulcers  Diagnosis:  Tinea unguium, Pain in right toe, pain in left toes  Treatment & Plan Procedures and Treatment: Consent by patient was obtained for treatment procedures. The patient understood the discussion of treatment and procedures well. All questions were answered thoroughly reviewed. Debridement of mycotic and hypertrophic toenails, 1 through 5 bilateral and clearing of subungual debris. No ulceration, no infection noted.  Return Visit-Office Procedure: Patient instructed to return to the office for a follow up visit 3 months for continued evaluation and treatment.abetic foot and nail care     Madoc Holquin DPM 

## 2016-08-27 ENCOUNTER — Ambulatory Visit: Payer: Medicare Other | Admitting: Podiatry

## 2016-09-10 ENCOUNTER — Encounter: Payer: Self-pay | Admitting: Podiatry

## 2016-09-10 ENCOUNTER — Ambulatory Visit (INDEPENDENT_AMBULATORY_CARE_PROVIDER_SITE_OTHER): Payer: Medicare Other | Admitting: Podiatry

## 2016-09-10 DIAGNOSIS — E1151 Type 2 diabetes mellitus with diabetic peripheral angiopathy without gangrene: Secondary | ICD-10-CM

## 2016-09-10 DIAGNOSIS — B351 Tinea unguium: Secondary | ICD-10-CM

## 2016-09-10 DIAGNOSIS — M79675 Pain in left toe(s): Secondary | ICD-10-CM | POA: Diagnosis not present

## 2016-09-10 NOTE — Progress Notes (Signed)
Patient ID: Mario Mills, male   DOB: 10/02/1950, 66 y.o.   MRN: [id number] DiComplaint:  Visit Type: Patient returns to my office for continued preventative foot care services. Complaint: Patient states" my nails have grown long and thick and become painful to walk and wear shoes" Patient has been diagnosed with DM with vascular complications. He presents for preventative foot care services. No changes to ROS  Podiatric Exam: Vascular: dorsalis pedis and posterior tibial pulses are palpable bilateral. Capillary return is immediate. Temperature gradient is WNL. Skin turgor WNL  Sensorium: Normal Semmes Weinstein monofilament test. Normal tactile sensation bilaterally. Nail Exam: Pt has thick disfigured discolored nails with subungual debris noted bilateral entire nail hallux through fifth toenails Ulcer Exam: There is no evidence of ulcer or pre-ulcerative changes or infection. Orthopedic Exam: Muscle tone and strength are WNL. No limitations in general ROM. No crepitus or effusions noted. Foot type and digits show no abnormalities. Bony prominences are unremarkable. Skin: No Porokeratosis. No infection or ulcers  Diagnosis:  Tinea unguium, Pain in right toe, pain in left toes  Treatment & Plan Procedures and Treatment: Consent by patient was obtained for treatment procedures. The patient understood the discussion of treatment and procedures well. All questions were answered thoroughly reviewed. Debridement of mycotic and hypertrophic toenails, 1 through 5 bilateral and clearing of subungual debris. No ulceration, no infection noted.  Return Visit-Office Procedure: Patient instructed to return to the office for a follow up visit 3 months for continued evaluation and treatment.abetic foot and nail care     Gardiner Barefoot DPM

## 2016-11-09 ENCOUNTER — Other Ambulatory Visit (HOSPITAL_COMMUNITY): Payer: Medicare Other

## 2016-11-13 ENCOUNTER — Other Ambulatory Visit: Payer: Self-pay

## 2016-11-13 ENCOUNTER — Ambulatory Visit (HOSPITAL_COMMUNITY): Payer: Medicare Other | Attending: Cardiology

## 2016-11-13 DIAGNOSIS — I351 Nonrheumatic aortic (valve) insufficiency: Secondary | ICD-10-CM | POA: Diagnosis not present

## 2016-11-13 DIAGNOSIS — I313 Pericardial effusion (noninflammatory): Secondary | ICD-10-CM | POA: Diagnosis not present

## 2016-11-13 DIAGNOSIS — I700 Atherosclerosis of aorta: Secondary | ICD-10-CM | POA: Insufficient documentation

## 2016-11-13 DIAGNOSIS — I350 Nonrheumatic aortic (valve) stenosis: Secondary | ICD-10-CM | POA: Insufficient documentation

## 2016-11-16 ENCOUNTER — Telehealth: Payer: Self-pay | Admitting: Cardiology

## 2016-11-16 NOTE — Telephone Encounter (Signed)
I called the pt to discuss his echo results. He has normal LVF with moderate LVH (old) but has also developed moderate AS since his last echo in 2007.  I would do nothing different-keep f/u with Dr Gwenlyn Found in 6 months, he will probably need yearly echos.   Kerin Ransom PA-C 11/16/2016 10:43 AM

## 2016-12-08 ENCOUNTER — Ambulatory Visit: Payer: Medicare Other | Admitting: Podiatry

## 2016-12-16 ENCOUNTER — Ambulatory Visit: Payer: Medicare Other | Admitting: Podiatry

## 2016-12-31 ENCOUNTER — Ambulatory Visit (INDEPENDENT_AMBULATORY_CARE_PROVIDER_SITE_OTHER): Payer: Medicare Other | Admitting: Podiatry

## 2016-12-31 DIAGNOSIS — E1151 Type 2 diabetes mellitus with diabetic peripheral angiopathy without gangrene: Secondary | ICD-10-CM

## 2016-12-31 DIAGNOSIS — M79675 Pain in left toe(s): Secondary | ICD-10-CM

## 2016-12-31 DIAGNOSIS — B351 Tinea unguium: Secondary | ICD-10-CM | POA: Diagnosis not present

## 2016-12-31 NOTE — Progress Notes (Signed)
Patient ID: Mario Mills, male   DOB: 01/04/1951, 66 y.o.   MRN: [id number] DiComplaint:  Visit Type: Patient returns to my office for continued preventative foot care services. Complaint: Patient states" my nails have grown long and thick and become painful to walk and wear shoes" Patient has been diagnosed with DM with vascular complications. He presents for preventative foot care services. No changes to ROS  Podiatric Exam: Vascular: dorsalis pedis and posterior tibial pulses are palpable bilateral. Capillary return is immediate. Temperature gradient is WNL. Skin turgor WNL  Sensorium: Normal Semmes Weinstein monofilament test. Normal tactile sensation bilaterally. Nail Exam: Pt has thick disfigured discolored nails with subungual debris noted bilateral entire nail hallux through fifth toenails Ulcer Exam: There is no evidence of ulcer or pre-ulcerative changes or infection. Orthopedic Exam: Muscle tone and strength are WNL. No limitations in general ROM. No crepitus or effusions noted. Foot type and digits show no abnormalities. Bony prominences are unremarkable. Skin: No Porokeratosis. No infection or ulcers  Diagnosis:  Tinea unguium, Pain in right toe, pain in left toes  Treatment & Plan Procedures and Treatment: Consent by patient was obtained for treatment procedures. The patient understood the discussion of treatment and procedures well. All questions were answered thoroughly reviewed. Debridement of mycotic and hypertrophic toenails, 1 through 5 bilateral and clearing of subungual debris. No ulceration, no infection noted.  Return Visit-Office Procedure: Patient instructed to return to the office for a follow up visit 3 months for continued evaluation and treatment.abetic foot and nail care     Gardiner Barefoot DPM

## 2017-03-31 ENCOUNTER — Encounter: Payer: Self-pay | Admitting: Podiatry

## 2017-03-31 ENCOUNTER — Ambulatory Visit (INDEPENDENT_AMBULATORY_CARE_PROVIDER_SITE_OTHER): Payer: Medicare Other | Admitting: Podiatry

## 2017-03-31 DIAGNOSIS — E1151 Type 2 diabetes mellitus with diabetic peripheral angiopathy without gangrene: Secondary | ICD-10-CM | POA: Diagnosis not present

## 2017-03-31 DIAGNOSIS — B351 Tinea unguium: Secondary | ICD-10-CM | POA: Diagnosis not present

## 2017-03-31 DIAGNOSIS — M79675 Pain in left toe(s): Secondary | ICD-10-CM

## 2017-03-31 NOTE — Progress Notes (Addendum)
Patient ID: Mario Mills, male   DOB: Jun 02, 1950, 66 y.o.   MRN: [id number] DiComplaint:  Visit Type: Patient returns to my office for continued preventative foot care services. Complaint: Patient states" my nails have grown long and thick and become painful to walk and wear shoes" Patient has been diagnosed with DM with vascular complications. He presents for preventative foot care services. No changes to ROS  Podiatric Exam: Vascular: deferred due to unna boots. Sensorium: Deferred due to unna boots. Nail Exam: Pt has thick disfigured discolored nails with subungual debris noted bilateral entire nail hallux through fifth toenails Ulcer Exam: There is no evidence of ulcer or pre-ulcerative changes or infection. Orthopedic Exam: Muscle tone and strength are WNL. No limitations in general ROM. No crepitus or effusions noted. Foot type and digits show no abnormalities. Bony prominences are unremarkable. Skin: No Porokeratosis. No infection or ulcers  Diagnosis:  Tinea unguium, Pain in right toe, pain in left toes  Treatment & Plan Procedures and Treatment: Consent by patient was obtained for treatment procedures. The patient understood the discussion of treatment and procedures well. All questions were answered thoroughly reviewed. Debridement of mycotic and hypertrophic toenails, 1 through 5 bilateral and clearing of subungual debris. No ulceration, no infection noted.  Return Visit-Office Procedure: Patient instructed to return to the office for a follow up visit 3 months for continued evaluation and treatment.abetic foot and nail care     Gardiner Barefoot DPM

## 2017-04-11 ENCOUNTER — Inpatient Hospital Stay (HOSPITAL_COMMUNITY)
Admission: EM | Admit: 2017-04-11 | Discharge: 2017-04-14 | DRG: 292 | Disposition: A | Discharge status: Home / Self Care | Payer: Medicare Other | Attending: Internal Medicine | Admitting: Internal Medicine

## 2017-04-11 ENCOUNTER — Emergency Department (HOSPITAL_COMMUNITY): Payer: Medicare Other

## 2017-04-11 ENCOUNTER — Other Ambulatory Visit: Payer: Self-pay

## 2017-04-11 ENCOUNTER — Encounter (HOSPITAL_COMMUNITY): Payer: Self-pay | Admitting: Emergency Medicine

## 2017-04-11 DIAGNOSIS — I83229 Varicose veins of left lower extremity with both ulcer of unspecified site and inflammation: Secondary | ICD-10-CM | POA: Diagnosis present

## 2017-04-11 DIAGNOSIS — E785 Hyperlipidemia, unspecified: Secondary | ICD-10-CM | POA: Diagnosis present

## 2017-04-11 DIAGNOSIS — I480 Paroxysmal atrial fibrillation: Secondary | ICD-10-CM | POA: Diagnosis present

## 2017-04-11 DIAGNOSIS — L97929 Non-pressure chronic ulcer of unspecified part of left lower leg with unspecified severity: Secondary | ICD-10-CM

## 2017-04-11 DIAGNOSIS — Z91128 Patient's intentional underdosing of medication regimen for other reason: Secondary | ICD-10-CM

## 2017-04-11 DIAGNOSIS — Z9119 Patient's noncompliance with other medical treatment and regimen: Secondary | ICD-10-CM

## 2017-04-11 DIAGNOSIS — Z7982 Long term (current) use of aspirin: Secondary | ICD-10-CM

## 2017-04-11 DIAGNOSIS — I10 Essential (primary) hypertension: Secondary | ICD-10-CM | POA: Diagnosis present

## 2017-04-11 DIAGNOSIS — E1151 Type 2 diabetes mellitus with diabetic peripheral angiopathy without gangrene: Secondary | ICD-10-CM | POA: Diagnosis present

## 2017-04-11 DIAGNOSIS — T447X6A Underdosing of beta-adrenoreceptor antagonists, initial encounter: Secondary | ICD-10-CM | POA: Diagnosis present

## 2017-04-11 DIAGNOSIS — Z91199 Patient's noncompliance with other medical treatment and regimen due to unspecified reason: Secondary | ICD-10-CM

## 2017-04-11 DIAGNOSIS — I5033 Acute on chronic diastolic (congestive) heart failure: Secondary | ICD-10-CM | POA: Diagnosis present

## 2017-04-11 DIAGNOSIS — G4733 Obstructive sleep apnea (adult) (pediatric): Secondary | ICD-10-CM | POA: Diagnosis present

## 2017-04-11 DIAGNOSIS — Z96652 Presence of left artificial knee joint: Secondary | ICD-10-CM | POA: Diagnosis present

## 2017-04-11 DIAGNOSIS — Z87891 Personal history of nicotine dependence: Secondary | ICD-10-CM

## 2017-04-11 DIAGNOSIS — I509 Heart failure, unspecified: Secondary | ICD-10-CM | POA: Insufficient documentation

## 2017-04-11 DIAGNOSIS — E11622 Type 2 diabetes mellitus with other skin ulcer: Secondary | ICD-10-CM | POA: Diagnosis present

## 2017-04-11 DIAGNOSIS — I8311 Varicose veins of right lower extremity with inflammation: Secondary | ICD-10-CM | POA: Diagnosis present

## 2017-04-11 DIAGNOSIS — N179 Acute kidney failure, unspecified: Secondary | ICD-10-CM

## 2017-04-11 DIAGNOSIS — Z6841 Body Mass Index (BMI) 40.0 and over, adult: Secondary | ICD-10-CM | POA: Diagnosis not present

## 2017-04-11 DIAGNOSIS — I83029 Varicose veins of left lower extremity with ulcer of unspecified site: Secondary | ICD-10-CM

## 2017-04-11 DIAGNOSIS — L97928 Non-pressure chronic ulcer of unspecified part of left lower leg with other specified severity: Secondary | ICD-10-CM | POA: Diagnosis present

## 2017-04-11 DIAGNOSIS — X58XXXA Exposure to other specified factors, initial encounter: Secondary | ICD-10-CM | POA: Diagnosis present

## 2017-04-11 DIAGNOSIS — K219 Gastro-esophageal reflux disease without esophagitis: Secondary | ICD-10-CM | POA: Diagnosis present

## 2017-04-11 DIAGNOSIS — I110 Hypertensive heart disease with heart failure: Principal | ICD-10-CM | POA: Diagnosis present

## 2017-04-11 DIAGNOSIS — I83019 Varicose veins of right lower extremity with ulcer of unspecified site: Secondary | ICD-10-CM | POA: Diagnosis present

## 2017-04-11 DIAGNOSIS — Z7984 Long term (current) use of oral hypoglycemic drugs: Secondary | ICD-10-CM

## 2017-04-11 DIAGNOSIS — Z7901 Long term (current) use of anticoagulants: Secondary | ICD-10-CM | POA: Diagnosis not present

## 2017-04-11 DIAGNOSIS — L97919 Non-pressure chronic ulcer of unspecified part of right lower leg with unspecified severity: Secondary | ICD-10-CM

## 2017-04-11 LAB — URINALYSIS, ROUTINE W REFLEX MICROSCOPIC
BILIRUBIN URINE: NEGATIVE
Bacteria, UA: NONE SEEN
GLUCOSE, UA: NEGATIVE mg/dL
HGB URINE DIPSTICK: NEGATIVE
KETONES UR: NEGATIVE mg/dL
LEUKOCYTES UA: NEGATIVE
NITRITE: NEGATIVE
PROTEIN: 100 mg/dL — AB
Specific Gravity, Urine: 1.006 (ref 1.005–1.030)
Squamous Epithelial / LPF: NONE SEEN
pH: 5 (ref 5.0–8.0)

## 2017-04-11 LAB — BASIC METABOLIC PANEL
Anion gap: 6 (ref 5–15)
BUN: 21 mg/dL — ABNORMAL HIGH (ref 6–20)
CO2: 29 mmol/L (ref 22–32)
Calcium: 8.7 mg/dL — ABNORMAL LOW (ref 8.9–10.3)
Chloride: 106 mmol/L (ref 101–111)
Creatinine, Ser: 1.32 mg/dL — ABNORMAL HIGH (ref 0.61–1.24)
GFR calc Af Amer: >60 mL/min (ref >60)
GFR calc non Af Amer: 54 mL/min — ABNORMAL LOW (ref >60)
Glucose, Bld: 125 mg/dL — ABNORMAL HIGH (ref 65–99)
Potassium: 3.7 mmol/L (ref 3.5–5.1)
Sodium: 141 mmol/L (ref 135–145)

## 2017-04-11 LAB — GLUCOSE, CAPILLARY
Glucose-Capillary: 96 mg/dL (ref 65–99)
Glucose-Capillary: 185 mg/dL — ABNORMAL HIGH (ref 65–99)

## 2017-04-11 LAB — CBC
HCT: 30.7 % — ABNORMAL LOW (ref 39.0–52.0)
Hemoglobin: 9.7 g/dL — ABNORMAL LOW (ref 13.0–17.0)
MCH: 30.2 pg (ref 26.0–34.0)
MCHC: 31.6 g/dL (ref 30.0–36.0)
MCV: 95.6 fL (ref 78.0–100.0)
Platelets: 364 10*3/uL (ref 150–400)
RBC: 3.21 MIL/uL — ABNORMAL LOW (ref 4.22–5.81)
RDW: 16.6 % — ABNORMAL HIGH (ref 11.5–15.5)
WBC: 8.6 10*3/uL (ref 4.0–10.5)

## 2017-04-11 LAB — I-STAT TROPONIN, ED: Troponin i, poc: 0.02 ng/mL (ref 0.00–0.08)

## 2017-04-11 LAB — MRSA PCR SCREENING: MRSA BY PCR: POSITIVE — AB

## 2017-04-11 LAB — BRAIN NATRIURETIC PEPTIDE: B Natriuretic Peptide: 298.7 pg/mL — ABNORMAL HIGH (ref 0.0–100.0)

## 2017-04-11 MED ORDER — SODIUM CHLORIDE 0.9% FLUSH
3.0000 mL | Freq: Two times a day (BID) | INTRAVENOUS | Status: DC
  Administered 2017-04-11 – 2017-04-14 (×6): 3 mL via INTRAVENOUS

## 2017-04-11 MED ORDER — PNEUMOCOCCAL VAC POLYVALENT 25 MCG/0.5ML IJ INJ
0.5000 mL | INJECTION | INTRAMUSCULAR | Status: DC
  Filled 2017-04-11: qty 0.5

## 2017-04-11 MED ORDER — DILTIAZEM HCL ER COATED BEADS 180 MG PO CP24
360.0000 mg | ORAL_CAPSULE | Freq: Every day | ORAL | Status: DC
  Administered 2017-04-12 – 2017-04-14 (×3): 360 mg via ORAL
  Filled 2017-04-11 (×3): qty 2

## 2017-04-11 MED ORDER — DIAZEPAM 5 MG PO TABS: 5.0000 mg | ORAL_TABLET | Freq: Every day | ORAL | Status: DC

## 2017-04-11 MED ORDER — MORPHINE SULFATE (PF) 4 MG/ML IV SOLN
4.0000 mg | Freq: Once | INTRAVENOUS | Status: AC
  Administered 2017-04-11: 4 mg via INTRAVENOUS
  Filled 2017-04-11: qty 1

## 2017-04-11 MED ORDER — INSULIN ASPART 100 UNIT/ML ~~LOC~~ SOLN: 0.0000 [IU] | Freq: Every day | SUBCUTANEOUS | Status: DC

## 2017-04-11 MED ORDER — ATORVASTATIN CALCIUM 40 MG PO TABS
40.0000 mg | ORAL_TABLET | Freq: Every day | ORAL | Status: DC
  Administered 2017-04-11 – 2017-04-13 (×3): 40 mg via ORAL
  Filled 2017-04-11 (×3): qty 1

## 2017-04-11 MED ORDER — IPRATROPIUM-ALBUTEROL 0.5-2.5 (3) MG/3ML IN SOLN
3.0000 mL | Freq: Once | RESPIRATORY_TRACT | Status: AC
  Administered 2017-04-11: 3 mL via RESPIRATORY_TRACT
  Filled 2017-04-11: qty 3

## 2017-04-11 MED ORDER — CARVEDILOL 12.5 MG PO TABS
12.5000 mg | ORAL_TABLET | Freq: Two times a day (BID) | ORAL | Status: DC
  Administered 2017-04-11 – 2017-04-14 (×7): 12.5 mg via ORAL
  Filled 2017-04-11 (×7): qty 1

## 2017-04-11 MED ORDER — BIOTENE DRY MOUTH MT LIQD: 15.0000 mL | OROMUCOSAL | Status: DC | PRN

## 2017-04-11 MED ORDER — DIAZEPAM 5 MG PO TABS
5.0000 mg | ORAL_TABLET | Freq: Every day | ORAL | Status: DC
  Administered 2017-04-11 – 2017-04-13 (×3): 5 mg via ORAL
  Filled 2017-04-11 (×3): qty 1

## 2017-04-11 MED ORDER — CHLORHEXIDINE GLUCONATE CLOTH 2 % EX PADS
6.0000 | MEDICATED_PAD | Freq: Every day | CUTANEOUS | Status: DC
  Administered 2017-04-12 – 2017-04-14 (×3): 6 via TOPICAL

## 2017-04-11 MED ORDER — HYDROMORPHONE HCL 2 MG PO TABS
2.0000 mg | ORAL_TABLET | Freq: Four times a day (QID) | ORAL | Status: DC | PRN
  Administered 2017-04-11: 4 mg via ORAL
  Administered 2017-04-12: 2 mg via ORAL
  Administered 2017-04-12: 4 mg via ORAL
  Administered 2017-04-12: 2 mg via ORAL
  Administered 2017-04-13 – 2017-04-14 (×2): 4 mg via ORAL
  Administered 2017-04-14: 2 mg via ORAL
  Filled 2017-04-11: qty 2
  Filled 2017-04-11: qty 1
  Filled 2017-04-11 (×2): qty 2
  Filled 2017-04-11 (×3): qty 1
  Filled 2017-04-11: qty 2

## 2017-04-11 MED ORDER — INSULIN ASPART 100 UNIT/ML ~~LOC~~ SOLN
0.0000 [IU] | Freq: Three times a day (TID) | SUBCUTANEOUS | Status: DC
  Administered 2017-04-12: 3 [IU] via SUBCUTANEOUS
  Administered 2017-04-12: 4 [IU] via SUBCUTANEOUS
  Administered 2017-04-13: 3 [IU] via SUBCUTANEOUS
  Administered 2017-04-13: 4 [IU] via SUBCUTANEOUS
  Administered 2017-04-14: 7 [IU] via SUBCUTANEOUS
  Administered 2017-04-14: 4 [IU] via SUBCUTANEOUS
  Administered 2017-04-14: 3 [IU] via SUBCUTANEOUS

## 2017-04-11 MED ORDER — POTASSIUM CHLORIDE CRYS ER 20 MEQ PO TBCR: 20.0000 meq | EXTENDED_RELEASE_TABLET | Freq: Two times a day (BID) | ORAL | Status: DC | PRN

## 2017-04-11 MED ORDER — MUPIROCIN 2 % EX OINT
1.0000 "application " | TOPICAL_OINTMENT | Freq: Two times a day (BID) | CUTANEOUS | Status: DC
  Administered 2017-04-12 – 2017-04-14 (×6): 1 via NASAL
  Filled 2017-04-11: qty 22

## 2017-04-11 MED ORDER — HEPARIN SODIUM (PORCINE) 5000 UNIT/ML IJ SOLN
5000.0000 [IU] | Freq: Three times a day (TID) | INTRAMUSCULAR | Status: DC
  Administered 2017-04-11 – 2017-04-14 (×9): 5000 [IU] via SUBCUTANEOUS
  Filled 2017-04-11 (×9): qty 1

## 2017-04-11 MED ORDER — ASPIRIN 325 MG PO TABS
325.0000 mg | ORAL_TABLET | Freq: Every day | ORAL | Status: DC
  Administered 2017-04-12 – 2017-04-14 (×3): 325 mg via ORAL
  Filled 2017-04-11 (×3): qty 1

## 2017-04-11 MED ORDER — ONDANSETRON HCL 4 MG/2ML IJ SOLN: 4.0000 mg | Freq: Four times a day (QID) | INTRAMUSCULAR | Status: DC | PRN

## 2017-04-11 MED ORDER — FUROSEMIDE 10 MG/ML IJ SOLN
60.0000 mg | Freq: Two times a day (BID) | INTRAMUSCULAR | Status: DC
  Administered 2017-04-11 – 2017-04-14 (×6): 60 mg via INTRAVENOUS
  Filled 2017-04-11 (×2): qty 6
  Filled 2017-04-11: qty 8
  Filled 2017-04-11 (×3): qty 6

## 2017-04-11 MED ORDER — SODIUM CHLORIDE 0.9% FLUSH: 3.0000 mL | INTRAVENOUS | Status: DC | PRN

## 2017-04-11 MED ORDER — DICLOFENAC SODIUM 75 MG PO TBEC
75.0000 mg | DELAYED_RELEASE_TABLET | Freq: Two times a day (BID) | ORAL | Status: DC | PRN
  Filled 2017-04-11: qty 1

## 2017-04-11 MED ORDER — TAMSULOSIN HCL 0.4 MG PO CAPS
0.4000 mg | ORAL_CAPSULE | Freq: Every day | ORAL | Status: DC
  Administered 2017-04-11 – 2017-04-13 (×3): 0.4 mg via ORAL
  Filled 2017-04-11 (×3): qty 1

## 2017-04-11 MED ORDER — HYDRALAZINE HCL 20 MG/ML IJ SOLN
10.0000 mg | Freq: Four times a day (QID) | INTRAMUSCULAR | Status: DC | PRN
  Administered 2017-04-12 – 2017-04-14 (×5): 10 mg via INTRAVENOUS
  Filled 2017-04-11 (×5): qty 1

## 2017-04-11 MED ORDER — SODIUM CHLORIDE 0.9 % IV SOLN: 250.0000 mL | INTRAVENOUS | Status: DC | PRN

## 2017-04-11 MED ORDER — HYDROXYZINE HCL 25 MG PO TABS
25.0000 mg | ORAL_TABLET | Freq: Two times a day (BID) | ORAL | Status: DC | PRN
  Administered 2017-04-12 – 2017-04-13 (×2): 25 mg via ORAL
  Filled 2017-04-11 (×2): qty 1

## 2017-04-11 MED ORDER — INSULIN DETEMIR 100 UNIT/ML ~~LOC~~ SOLN
10.0000 [IU] | Freq: Every day | SUBCUTANEOUS | Status: DC
  Administered 2017-04-11 – 2017-04-13 (×3): 10 [IU] via SUBCUTANEOUS
  Filled 2017-04-11 (×4): qty 0.1

## 2017-04-11 MED ORDER — ACETAMINOPHEN 325 MG PO TABS: 650.0000 mg | ORAL_TABLET | ORAL | Status: DC | PRN

## 2017-04-11 NOTE — Progress Notes (Signed)
CPAP placed in pts. room, has own hose and nasal mask, humidifier filled, currently on room air, made aware to notify when ready to be placed on, RT to monitor.

## 2017-04-11 NOTE — ED Notes (Signed)
I have just given report to Katharine Look, RN on [facility] and will transport now.

## 2017-04-11 NOTE — ED Notes (Signed)
ED TO INPATIENT HANDOFF REPORT  Name/Age/Gender Mario Mills 66 y.o. male  Code Status Code Status History    Date Active Date Inactive Code Status Order ID Comments User Context   05/31/2015 15:26 06/11/2015 18:53 Full Code [id number]  Gaynelle Arabian, MD Inpatient      Home/SNF/Other Home  Chief Complaint shortness of breath/chest tightness/excess body fluid  Level of Care/Admitting Diagnosis ED Disposition    ED Disposition Condition [facility] Area: [facility] [[id number]]  Level of Care: Telemetry [5]  Admit to tele based on following criteria: Acute CHF  Diagnosis: Acute exacerbation of CHF (congestive heart failure) [facility]) [[id number]]  Admitting Physician: Maida Sale  Attending Physician: Geradine Girt [[id number]]  Estimated length of stay: past midnight tomorrow  Certification:: I certify this patient will need inpatient services for at least 2 midnights  PT Class (Do Not Modify): Inpatient [101]  PT Acc Code (Do Not Modify): Private [1]       Medical History Past Medical History:  Diagnosis Date  . Acute blood loss anemia   . Arthritis   . Chronic anticoagulation    Eliquis  . Chronic diastolic heart failure ([place])   . Chronic low back pain with sciatica   . Chronic low back pain with sciatica   . Constipation   . DM (diabetes mellitus) type II controlled peripheral vascular disorder   . DOE (dyspnea on exertion)    WITH MINIMAL EXERTION  . Dysrhythmia   . Falls   . GERD (gastroesophageal reflux disease)   . Heart murmur   . History of Doppler ultrasound 12/2012   LE VENOUS REFLUX demostrated on L (vein stripping on R w/MRSA); h/o venous stasis bilat   . History of urinary tract infection   . Hyperlipidemia   . Hypertension   . Hypokalemia   . Numbness and tingling    feet bilat   . Obstructive sleep apnea    CPAP  . Paroxysmal atrial fibrillation (HCC)   . Peripheral arterial disease ([place])   . Primary  osteoarthritis of left knee   . Urinary incontinence   . Venous insufficiency   . Venous stasis ulcer ([place])     Allergies No Known Allergies  IV Location/Drains/Wounds Patient Lines/Drains/Airways Status   Active Line/Drains/Airways    Name:   Placement date:   Placement time:   Site:   Days:   Peripheral IV 04/11/17 Left Antecubital   04/11/17    1325    Antecubital   less than 1          Labs/Imaging Results for orders placed or performed during the hospital encounter of 04/11/17 (from the past 48 hour(s))  Basic metabolic panel     Status: Abnormal   Collection Time: 04/11/17  1:21 PM  Result Value Ref Range   Sodium 141 135 - 145 mmol/L   Potassium 3.7 3.5 - 5.1 mmol/L   Chloride 106 101 - 111 mmol/L   CO2 29 22 - 32 mmol/L   Glucose, Bld 125 (H) 65 - 99 mg/dL   BUN 21 (H) 6 - 20 mg/dL   Creatinine, Ser 1.32 (H) 0.61 - 1.24 mg/dL   Calcium 8.7 (L) 8.9 - 10.3 mg/dL   GFR calc non Af Amer 54 (L) >60 mL/min   GFR calc Af Amer >60 >60 mL/min    Comment: (NOTE) The eGFR has been calculated using the CKD EPI equation. This calculation has not been validated  in all clinical situations. eGFR's persistently <60 mL/min signify possible Chronic Kidney Disease.    Anion gap 6 5 - 15  CBC     Status: Abnormal   Collection Time: 04/11/17  1:21 PM  Result Value Ref Range   WBC 8.6 4.0 - 10.5 K/uL   RBC 3.21 (L) 4.22 - 5.81 MIL/uL   Hemoglobin 9.7 (L) 13.0 - 17.0 g/dL   HCT 30.7 (L) 39.0 - 52.0 %   MCV 95.6 78.0 - 100.0 fL   MCH 30.2 26.0 - 34.0 pg   MCHC 31.6 30.0 - 36.0 g/dL   RDW 16.6 (H) 11.5 - 15.5 %   Platelets 364 150 - 400 K/uL  Brain natriuretic peptide     Status: Abnormal   Collection Time: 04/11/17  1:21 PM  Result Value Ref Range   B Natriuretic Peptide 298.7 (H) 0.0 - 100.0 pg/mL  I-stat troponin, ED     Status: None   Collection Time: 04/11/17  1:32 PM  Result Value Ref Range   Troponin i, poc 0.02 0.00 - 0.08 ng/mL   Comment 3            Comment: Due  to the release kinetics of cTnI, a negative result within the first hours of the onset of symptoms does not rule out myocardial infarction with certainty. If myocardial infarction is still suspected, repeat the test at appropriate intervals.    Dg Chest 2 View  Result Date: 04/11/2017 CLINICAL DATA:  66 year old male with shortness of breath. EXAM: CHEST  2 VIEW COMPARISON:  04/05/2016 FINDINGS: There are low lung volumes with bronchovascular crowding. Cardiomediastinal silhouette is enlarged but stable. There is mild pulmonary vascular congestion without frank edema. No focal parenchymal opacity, sizable pleural effusions or pneumothorax. No acute osseous abnormality. IMPRESSION: Stable appearance of the chest with mild cardiomegaly and pulmonary vascular congestion. Electronically Signed   By: Kristopher Oppenheim M.D.   On: 04/11/2017 13:22    Pending Labs Unresulted Labs (From admission, onward)   Start     Ordered   04/11/17 1346  Urinalysis, Routine w reflex microscopic  STAT,   STAT     04/11/17 1345   Signed and Held  Basic metabolic panel  Daily,   R     Signed and Held      Vitals/Pain Today's Vitals   04/11/17 1224 04/11/17 1229 04/11/17 1300 04/11/17 1358  BP: (!) 180/98  (!) 179/80   Pulse: 66  64   Resp: 18     Temp: 98.1 F (36.7 C)     TempSrc: Oral     SpO2: 96%  97% 97%  Weight:  (!) 344 lb (156 kg)    Height:  6' (1.829 m)      Isolation Precautions No active isolations  Medications Medications  antiseptic oral rinse (BIOTENE) solution 15 mL (not administered)  furosemide (LASIX) injection 60 mg (not administered)  atorvastatin (LIPITOR) tablet 40 mg (not administered)  hydrOXYzine (ATARAX/VISTARIL) tablet 25 mg (not administered)  tamsulosin (FLOMAX) capsule 0.4 mg (not administered)  hydrALAZINE (APRESOLINE) injection 10 mg (not administered)  ipratropium-albuterol (DUONEB) 0.5-2.5 (3) MG/3ML nebulizer solution 3 mL (3 mLs Nebulization Given 04/11/17  1358)  morphine 4 MG/ML injection 4 mg (4 mg Intravenous Given 04/11/17 1505)    Mobility

## 2017-04-11 NOTE — ED Provider Notes (Signed)
[facility] DEPT Provider Note   CSN: [id number] Arrival date & time: 04/11/17  1213     History   Chief Complaint Chief Complaint  Patient presents with  . Shortness of Breath  . Leg Swelling    HPI Mario Mills is a 66 y.o. male with history of chronic diastolic heart failure, DM type II, GERD, paroxysmal A. fib, peripheral arterial disease, peripheral vascular disease with venous stasis ulcers presents today with chief complaint gradual onset, progressively worsening bilateral lower extremity swelling and shortness of breath.  Patient states this problem has been ongoing for 6 weeks but acutely worsened over the past 4-5 days.  Endorses DOE, shortness of breath at rest, and orthopnea.  Endorses intermittent central chest pressure which he states goes along with his shortness of breath and worsens with lying flat on his back.  Also endorses nonproductive cough.  Denies fevers but endorses chills.  He states that he has been wheezing and has used albuterol inhaler at home with mild temporary relief.  He was seen and evaluated by his primary care physician on Tuesday who instructed him to double his Lasix dose for the next 3 days.  He states that he does not typically take his Lasix as prescribed due to urinary frequency side effect.  States his legs have been weeping serous fluid and at times blood. States he was ruled out for DVT of the LE approximately 3 weeks ago at [facility].   Of note, he is also being treated for a UTI with nitrofurantoin by his PCP, started medication on Tuesday.  Denies urinary symptoms at this time. The history is provided by the patient.    Past Medical History:  Diagnosis Date  . Acute blood loss anemia   . Arthritis   . Chronic anticoagulation    Eliquis  . Chronic diastolic heart failure ([place])   . Chronic low back pain with sciatica   . Chronic low back pain with sciatica   . Constipation   . DM (diabetes mellitus)  type II controlled peripheral vascular disorder   . DOE (dyspnea on exertion)    WITH MINIMAL EXERTION  . Dysrhythmia   . Falls   . GERD (gastroesophageal reflux disease)   . Heart murmur   . History of Doppler ultrasound 12/2012   LE VENOUS REFLUX demostrated on L (vein stripping on R w/MRSA); h/o venous stasis bilat   . History of urinary tract infection   . Hyperlipidemia   . Hypertension   . Hypokalemia   . Numbness and tingling    feet bilat   . Obstructive sleep apnea    CPAP  . Paroxysmal atrial fibrillation (HCC)   . Peripheral arterial disease ([place])   . Primary osteoarthritis of left knee   . Urinary incontinence   . Venous insufficiency   . Venous stasis ulcer ([place])     Patient Active Problem List   Diagnosis Date Noted  . Acute exacerbation of CHF (congestive heart failure) ([place]) 04/11/2017  . Aortic stenosis, mild 06/01/2016  . Chronic diastolic CHF (congestive heart failure), NYHA class 2 ([place]) 06/26/2015  . Paroxysmal atrial fibrillation (HCC)   . Chronic anticoagulation   . Acute on chronic diastolic (congestive) heart failure ([place])   . Aspiration pneumonitis ([place]) 06/06/2015  . Pulmonary vascular congestion 06/02/2015  . Postoperative anemia due to acute blood loss 06/02/2015  . Hypertensive urgency 05/31/2015  . Acute respiratory failure with hypoxia ([place]) 05/31/2015  . S/P left knee  arthroscopy 05/31/2015  . DM (diabetes mellitus) type II controlled peripheral vascular disorder ([place]) 05/31/2015  . Morbid obesity due to excess calories ([place]) 05/31/2015  . Bradycardia with 41 - 50 beats per minute 05/31/2015  . Sepsis, unspecified organism ([place]) 05/31/2015  . Venous stasis ulcers of both lower extremities ([place]) 03/28/2014  . Essential hypertension 12/22/2012  . Obstructive sleep apnea 12/22/2012    Past Surgical History:  Procedure Laterality Date  . COLON RESECTION  2007  . COLON SURGERY    . TONSILLECTOMY    . TOTAL KNEE ARTHROPLASTY Left  05/31/2015   Procedure: TOTAL LEFT  KNEE ARTHROPLASTY;  Surgeon: Gaynelle Arabian, MD;  Location: [facility] ORS;  Service: Orthopedics;  Laterality: Left;  . TRANSTHORACIC ECHOCARDIOGRAM  07/02/2005   mod concentric LVH; LV borderline dilated; LA mild-mod dilated; mild MR & TR  . VARICOSE VEIN SURGERY     R leg       Home Medications    Prior to Admission medications   Medication Sig Start Date End Date Taking? Authorizing Provider  aspirin 500 MG tablet Take 500 mg by mouth every 6 (six) hours as needed for pain.   Yes [provider]  acetaminophen (TYLENOL) 325 MG tablet Take 2 tablets (650 mg total) by mouth every 6 (six) hours as needed for mild pain, fever or headache (or Fever >/= 101). 06/11/15   Perkins, Alexzandrew L, PA-C  atorvastatin (LIPITOR) 20 MG tablet Take 1 tablet (20 mg total) by mouth at bedtime. 06/11/15   Perkins, Alexzandrew L, PA-C  carvedilol (COREG) 12.5 MG tablet Take 12.5 mg by mouth 2 (two) times daily with a meal.    [provider]  diazepam (VALIUM) 5 MG tablet Take 5 mg by mouth daily. Take at [facility] 04/24/15   [provider]  diclofenac (VOLTAREN) 75 MG EC tablet TAKE 1 TABLET BY MOUTH TWICE A DAY AS NEEDED 11/02/15   [provider]  diltiazem (CARDIZEM CD) 360 MG 24 hr capsule Take 1 capsule (360 mg total) by mouth daily. 06/11/15   Perkins, Alexzandrew L, PA-C  furosemide (LASIX) 40 MG tablet Take 1 tablet (40 mg total) by mouth daily. 06/01/16   Erlene Quan, PA-C  glimepiride (AMARYL) 4 MG tablet Take 4 mg by mouth daily before breakfast. Reported on 09/24/2015    [provider]  HYDROmorphone (DILAUDID) 2 MG tablet Take 2-4 mg by mouth. One (1) tablet po at 2PM PRN for pain and before therapy. Take 2 tabs po at 8AM and 8PM, hold for sedation    [provider]  LEVEMIR FLEXTOUCH 100 UNIT/ML Pen Inject 20 Units into the skin at bedtime. 05/10/15   [provider]  lisinopril-hydrochlorothiazide  (PRINZIDE,ZESTORETIC) 20-25 MG tablet Take 1 tablet by mouth daily.    [provider]  metFORMIN (GLUCOPHAGE) 1000 MG tablet Take 1,000 mg by mouth 2 (two) times daily with a meal.    [provider]  potassium chloride SA (K-DUR,KLOR-CON) 20 MEQ tablet Take 20 mEq by mouth 2 (two) times daily as needed (take with lasix).     [provider]  tolterodine (DETROL LA) 4 MG 24 hr capsule Take 4 mg by mouth. 01/16/16   [provider]  triamcinolone cream (KENALOG) 0.1 % Apply 1 application topically daily as needed. 05/01/15   [provider]    Family History Family History  Problem Relation Age of Onset  . Pancreatic cancer Father   . Cancer Father   .  Heart Problems Mother   . Hypertension Mother   . Diabetes Brother   . Hypertension Brother   . Colon cancer Neg Hx     Social History Social History   Tobacco Use  . Smoking status: Former Smoker    Packs/day: 1.00    Years: 35.00    Pack years: 35.00    Types: Cigarettes    Last attempt to quit: 07/25/2005    Years since quitting: 11.7  . Smokeless tobacco: Never Used  Substance Use Topics  . Alcohol use: Yes    Alcohol/week: 0.0 oz    Types: 1 Standard drinks or equivalent per week    Comment: rarely   . Drug use: No     Allergies   Patient has no known allergies.   Review of Systems Review of Systems  Constitutional: Positive for chills. Negative for fever.  Respiratory: Positive for cough and shortness of breath.   Cardiovascular: Positive for leg swelling.  Gastrointestinal: Negative for abdominal pain, nausea and vomiting.  Genitourinary: Negative for dysuria, frequency, hematuria and urgency.  All other systems reviewed and are negative.    Physical Exam Updated Vital Signs BP (!) 180/98 (BP Location: Right Arm)   Pulse 66   Temp 98.1 F (36.7 C) (Oral)   Resp 18   Ht 6' (1.829 m)   Wt (!) 156 kg (344 lb)   SpO2 97%   BMI 46.65 kg/m   Physical Exam    Constitutional: He appears well-developed and well-nourished. No distress.  Obese, resting in bed  HENT:  Head: Normocephalic and atraumatic.  Eyes: Conjunctivae are normal. Right eye exhibits no discharge. Left eye exhibits no discharge.  Neck: Normal range of motion. Neck supple. No JVD present. No tracheal deviation present.  Cardiovascular: Normal rate, regular rhythm and intact distal pulses.  2+ radial and DP/PT pulses bl   Pulmonary/Chest: Effort normal. He has decreased breath sounds. He has wheezes.  Globally diminished breath sounds,  mild scattered expiratory wheezing in the posterior lung, mildly tachypneic, speaking in full sentences without difficulty, equal rise and fall of chest  Abdominal: Soft. Bowel sounds are normal. He exhibits no distension. There is no tenderness.  Musculoskeletal:       Right lower leg: He exhibits tenderness and edema.       Left lower leg: He exhibits tenderness and edema.  Bilateral lower extremities with 3+ pitting edema as well as chronic venous stasis changes.  Skin is dark and lichenified, some lesions weeping serosanguineous fluid noted.  He has some bulla superior to the ankles bilaterally. Tender to palpation.  Neurological: He is alert.  Skin: Skin is warm and dry. No erythema.  Psychiatric: He has a normal mood and affect. His behavior is normal.  Nursing note and vitals reviewed.    ED Treatments / Results  Labs (all labs ordered are listed, but only abnormal results are displayed) Labs Reviewed  BASIC METABOLIC PANEL - Abnormal; Notable for the following components:      Result Value   Glucose, Bld 125 (*)    BUN 21 (*)    Creatinine, Ser 1.32 (*)    Calcium 8.7 (*)    GFR calc non Af Amer 54 (*)    All other components within normal limits  CBC - Abnormal; Notable for the following components:   RBC 3.21 (*)    Hemoglobin 9.7 (*)    HCT 30.7 (*)    RDW 16.6 (*)    All other  components within normal limits  BRAIN  NATRIURETIC PEPTIDE - Abnormal; Notable for the following components:   B Natriuretic Peptide 298.7 (*)    All other components within normal limits  URINALYSIS, ROUTINE W REFLEX MICROSCOPIC  I-STAT TROPONIN, ED    EKG  EKG Interpretation  Date/Time:  Sunday April 11 2017 12:22:50 EST Ventricular Rate:  65 PR Interval:    QRS Duration: 111 QT Interval:  394 QTC Calculation: 410 R Axis:   101 Text Interpretation:  Sinus rhythm Right axis deviation Borderline T abnormalities, diffuse leads Minimal ST elevation, lateral leads Baseline wander in lead(s) V2 No significant change since last tracing in january 2017 Confirmed by Merrily Pew [phone]) on 04/11/2017 2:34:10 PM       Radiology Dg Chest 2 View  Result Date: 04/11/2017 CLINICAL DATA:  66 year old male with shortness of breath. EXAM: CHEST  2 VIEW COMPARISON:  04/05/2016 FINDINGS: There are low lung volumes with bronchovascular crowding. Cardiomediastinal silhouette is enlarged but stable. There is mild pulmonary vascular congestion without frank edema. No focal parenchymal opacity, sizable pleural effusions or pneumothorax. No acute osseous abnormality. IMPRESSION: Stable appearance of the chest with mild cardiomegaly and pulmonary vascular congestion. Electronically Signed   By: Kristopher Oppenheim M.D.   On: 04/11/2017 13:22    Procedures Procedures (including critical care time)  Medications Ordered in ED Medications  antiseptic oral rinse (BIOTENE) solution 15 mL (not administered)  furosemide (LASIX) injection 60 mg (not administered)  ipratropium-albuterol (DUONEB) 0.5-2.5 (3) MG/3ML nebulizer solution 3 mL (3 mLs Nebulization Given 04/11/17 1358)  morphine 4 MG/ML injection 4 mg (4 mg Intravenous Given 04/11/17 1505)     Initial Impression / Assessment and Plan / ED Course  I have reviewed the triage vital signs and the nursing notes.  Pertinent labs & imaging results that were available during my care of the  patient were reviewed by me and considered in my medical decision making (see chart for details).     Patient with worsening bilateral lower extremity edema and shortness of breath.  Afebrile, hypertensive while in the ED but SPO2 saturations stable.  Chest x-ray shows pulmonary vascular congestion and mild cardiomegaly.Troponin is negative, low suspicion of ACS or MI.  No leukocytosis.  Slight bump in creatinine.  BNP elevated at nearly 300.  Mild wheezing, gave DuoNeb without significant relief of symptoms.  Suspect wheezing due to volume overload.  He has failed outpatient management of his CHF. Spoke with Dr. Eliseo Squires who agrees to assume care of patient and bring him into the hospital for further management. Patient seen and evaluated Dr. Dayna Barker who agrees with assessment and plan at this time.  Final Clinical Impressions(s) / ED Diagnoses   Final diagnoses:  Acute on chronic diastolic congestive heart failure [facility])    ED Discharge Orders    None       Renita Papa, PA-C 04/11/17 1611    Mesner, Corene Cornea, MD 04/17/17 [phone]

## 2017-04-11 NOTE — ED Notes (Signed)
Bed approved for 1444. RN may call report to Katharine Look at [phone] at 1615.

## 2017-04-11 NOTE — ED Triage Notes (Signed)
Patient c/o shortness of breath related to his fluid overload. Pt has bilateral leg edema with weeping. Pt states he has not been taking his fluid pill as normal due to the inconvenience of having to void while running errands. Pt reports gaining 46 pounds in the past 6 weeks.

## 2017-04-11 NOTE — Progress Notes (Signed)
CRITICAL VALUE ALERT  Critical Value:  MRSA Positive  Date & Time Notied: 04/11/17 10:57 PM   Provider Notified: N/A  Orders Received/Actions taken: Initiated contact precautions per protocol

## 2017-04-11 NOTE — ED Provider Notes (Signed)
Medical screening examination/treatment/procedure(s) were conducted as a shared visit with non-physician practitioner(s) and myself.  I personally evaluated the patient during the encounter.  66 year old male with past medical history of heart failure the presents emergency department with approximately 40 pounds of weight gain in the last 6 weeks and increased swelling in his legs and dyspnea on exertion.  Patient has seen his primary doctor told him to double up his Lasix which did seem to help.  On my exam patient in no respiratory distress but does have mild crackles borderline anasarca with swelling in bilateral lower extremities and some mild edema in his lower abdomen as well.  Patient states his abdomen does seem distended.  Plan for general cardiac workup and likely admission for diuresis as this is likely going to take multiple days and doses.   EKG Interpretation  Date/Time:  Sunday April 11 2017 12:22:50 EST Ventricular Rate:  65 PR Interval:    QRS Duration: 111 QT Interval:  394 QTC Calculation: 410 R Axis:   101 Text Interpretation:  Sinus rhythm Right axis deviation Borderline T abnormalities, diffuse leads Minimal ST elevation, lateral leads Baseline wander in lead(s) V2 No significant change since last tracing in january 2017 Confirmed by Merrily Pew [phone]) on 04/11/2017 2:34:10 PM         Callie Facey, Corene Cornea, MD 04/17/17 [phone]

## 2017-04-11 NOTE — H&P (Addendum)
History and Physical    LATHAN GIESELMAN GUY:[id number] DOB: 1950/07/17 DOA: 04/11/2017  Referring MD/NP/PA: er PCP: Nolene Ebbs, MD Outpatient Specialists: wound care at [facility], [facility] cardiology Patient coming from: home  Chief Complaint: SOB  HPI: Mario Mills is a 66 y.o. male with medical history significant of diastolic CHF, DM and chronic LE wounds/swelling.  He does not follow a strict diet of low Na, nor does he limit his fluid intake, and he does not weight self daily.  He comes in with increasing LE edema and SOB.  He thinks he has gained 40+ pounds in the last 6 weeks. He has chest pressure along with the SOB and is unable to lay flat.  He was seen by his PCP last Tuesday who doubled his lasix at home x  3days with little improvement.  He was also given an abx for worsening renal function per patient as PCP was concern it was elevated due to infection.    IN the ER, his x ray showed pulmonary vascular congestion, troponin negative.  Labs were remarkable for elevated Cr from his baseline which is normal.  No lasix given in the ER so no UOP recorded.     Review of Systems: all systems reviewed, negative unless stated above in HPI   Past Medical History:  Diagnosis Date  . Acute blood loss anemia   . Arthritis   . Chronic anticoagulation    Eliquis  . Chronic diastolic heart failure ([place])   . Chronic low back pain with sciatica   . Chronic low back pain with sciatica   . Constipation   . DM (diabetes mellitus) type II controlled peripheral vascular disorder   . DOE (dyspnea on exertion)    WITH MINIMAL EXERTION  . Dysrhythmia   . Falls   . GERD (gastroesophageal reflux disease)   . Heart murmur   . History of Doppler ultrasound 12/2012   LE VENOUS REFLUX demostrated on L (vein stripping on R w/MRSA); h/o venous stasis bilat   . History of urinary tract infection   . Hyperlipidemia   . Hypertension   . Hypokalemia   . Numbness and tingling    feet bilat   .  Obstructive sleep apnea    CPAP  . Paroxysmal atrial fibrillation (HCC)   . Peripheral arterial disease ([place])   . Primary osteoarthritis of left knee   . Urinary incontinence   . Venous insufficiency   . Venous stasis ulcer ([place])     Past Surgical History:  Procedure Laterality Date  . COLON RESECTION  2007  . COLON SURGERY    . TONSILLECTOMY    . TOTAL KNEE ARTHROPLASTY Left 05/31/2015   Procedure: TOTAL LEFT  KNEE ARTHROPLASTY;  Surgeon: Gaynelle Arabian, MD;  Location: [facility] ORS;  Service: Orthopedics;  Laterality: Left;  . TRANSTHORACIC ECHOCARDIOGRAM  07/02/2005   mod concentric LVH; LV borderline dilated; LA mild-mod dilated; mild MR & TR  . VARICOSE VEIN SURGERY     R leg     reports that he quit smoking about 11 years ago. His smoking use included cigarettes. He has a 35.00 pack-year smoking history. he has never used smokeless tobacco. He reports that he drinks alcohol. He reports that he does not use drugs.  No Known Allergies  Family History  Problem Relation Age of Onset  . Pancreatic cancer Father   . Cancer Father   . Heart Problems Mother   . Hypertension Mother   . Diabetes Brother   .  Hypertension Brother   . Colon cancer Neg Hx      Prior to Admission medications   Medication Sig Start Date End Date Taking? Authorizing Provider  acetaminophen (TYLENOL) 325 MG tablet Take 2 tablets (650 mg total) by mouth every 6 (six) hours as needed for mild pain, fever or headache (or Fever >/= 101). 06/11/15   Perkins, Alexzandrew L, PA-C  aspirin 325 MG tablet Take 325 mg by mouth daily. Reported on 09/24/2015    [provider]  atorvastatin (LIPITOR) 20 MG tablet Take 1 tablet (20 mg total) by mouth at bedtime. 06/11/15   Perkins, Alexzandrew L, PA-C  carvedilol (COREG) 12.5 MG tablet Take 12.5 mg by mouth 2 (two) times daily with a meal.    [provider]  diazepam (VALIUM) 5 MG tablet Take 5 mg by mouth daily. Take at [facility] 04/24/15   [provider]   diclofenac (VOLTAREN) 75 MG EC tablet TAKE 1 TABLET BY MOUTH TWICE A DAY AS NEEDED 11/02/15   [provider]  diltiazem (CARDIZEM CD) 360 MG 24 hr capsule Take 1 capsule (360 mg total) by mouth daily. 06/11/15   Perkins, Alexzandrew L, PA-C  furosemide (LASIX) 40 MG tablet Take 1 tablet (40 mg total) by mouth daily. 06/01/16   Erlene Quan, PA-C  glimepiride (AMARYL) 4 MG tablet Take 4 mg by mouth daily before breakfast. Reported on 09/24/2015    [provider]  HYDROmorphone (DILAUDID) 2 MG tablet Take 2-4 mg by mouth. One (1) tablet po at 2PM PRN for pain and before therapy. Take 2 tabs po at 8AM and 8PM, hold for sedation    [provider]  LEVEMIR FLEXTOUCH 100 UNIT/ML Pen Inject 20 Units into the skin at bedtime. 05/10/15   [provider]  lisinopril-hydrochlorothiazide (PRINZIDE,ZESTORETIC) 20-25 MG tablet Take 1 tablet by mouth daily.    [provider]  metFORMIN (GLUCOPHAGE) 1000 MG tablet Take 1,000 mg by mouth 2 (two) times daily with a meal.    [provider]  potassium chloride SA (K-DUR,KLOR-CON) 20 MEQ tablet Take 20 mEq by mouth 2 (two) times daily as needed (take with lasix).     [provider]  simvastatin (ZOCOR) 40 MG tablet  11/16/15   [provider]  tolterodine (DETROL LA) 4 MG 24 hr capsule Take 4 mg by mouth. 01/16/16   [provider]  triamcinolone cream (KENALOG) 0.1 % Apply 1 application topically daily as needed. 05/01/15   [provider]    Physical Exam: Vitals:   04/11/17 1224 04/11/17 1229 04/11/17 1358  BP: (!) 180/98    Pulse: 66    Resp: 18    Temp: 98.1 F (36.7 C)    TempSrc: Oral    SpO2: 96%  97%  Weight:  (!) 156 kg (344 lb)   Height:  6' (1.829 m)       Constitutional: NAD, in bed watching football Vitals:   04/11/17 1224 04/11/17 1229 04/11/17 1358  BP: (!) 180/98    Pulse: 66    Resp: 18    Temp: 98.1 F (36.7 C)    TempSrc: Oral    SpO2:  96%  97%  Weight:  (!) 156 kg (344 lb)   Height:  6' (1.829 m)    Eyes: PERRL, lids and conjunctivae normal ENMT: Mucous membranes are moist. Posterior pharynx clear of any exudate or lesions.Normal dentition.  Neck: large habitus, unable to assess JVD Respiratory: diminished, crackles at  bases, no wheezing, no increase work of breathing Cardiovascular: rrr, + LE edema with venous stasis changes and fluid filled blister on lower left leg Abdomen: no tenderness, no masses palpated. No hepatosplenomegaly. Bowel sounds positive.  Musculoskeletal: no clubbing / cyanosis. No joint deformity upper and lower extremities. Good ROM, no contractures. Normal muscle tone.  Skin: dry and peeling on LE Neurologic: CN 2-12 grossly intact. Sensation intact, DTR normal. Strength 5/5 in all 4.  Psychiatric: Normal judgment and insight. Alert and oriented x 3. Normal mood.     Labs on Admission: I have personally reviewed following labs and imaging studies  CBC: Recent Labs  Lab 04/11/17 1321  WBC 8.6  HGB 9.7*  HCT 30.7*  MCV 95.6  PLT 397   Basic Metabolic Panel: Recent Labs  Lab 04/11/17 1321  NA 141  K 3.7  CL 106  CO2 29  GLUCOSE 125*  BUN 21*  CREATININE 1.32*  CALCIUM 8.7*   GFR: Estimated Creatinine Clearance: 83.7 mL/min (A) (by C-G formula based on SCr of 1.32 mg/dL (H)). Liver Function Tests: No results for input(s): AST, ALT, ALKPHOS, BILITOT, PROT, ALBUMIN in the last 168 hours. No results for input(s): LIPASE, AMYLASE in the last 168 hours. No results for input(s): AMMONIA in the last 168 hours. Coagulation Profile: No results for input(s): INR, PROTIME in the last 168 hours. Cardiac Enzymes: No results for input(s): CKTOTAL, CKMB, CKMBINDEX, TROPONINI in the last 168 hours. BNP (last 3 results) No results for input(s): PROBNP in the last 8760 hours. HbA1C: No results for input(s): HGBA1C in the last 72 hours. CBG: No results for input(s): GLUCAP in the last 168  hours. Lipid Profile: No results for input(s): CHOL, HDL, LDLCALC, TRIG, CHOLHDL, LDLDIRECT in the last 72 hours. Thyroid Function Tests: No results for input(s): TSH, T4TOTAL, FREET4, T3FREE, THYROIDAB in the last 72 hours. Anemia Panel: No results for input(s): VITAMINB12, FOLATE, FERRITIN, TIBC, IRON, RETICCTPCT in the last 72 hours. Urine analysis:    Component Value Date/Time   COLORURINE YELLOW 06/06/2015 1616   APPEARANCEUR CLOUDY (A) 06/06/2015 1616   LABSPEC 1.015 06/06/2015 1616   PHURINE 5.0 06/06/2015 1616   GLUCOSEU NEGATIVE 06/06/2015 1616   HGBUR NEGATIVE 06/06/2015 1616   BILIRUBINUR NEGATIVE 06/06/2015 1616   KETONESUR NEGATIVE 06/06/2015 1616   PROTEINUR 100 (A) 06/06/2015 1616   NITRITE NEGATIVE 06/06/2015 1616   LEUKOCYTESUR NEGATIVE 06/06/2015 1616   Sepsis Labs: Invalid input(s): PROCALCITONIN, LACTICIDVEN No results found for this or any previous visit (from the past 240 hour(s)).   Radiological Exams on Admission: Dg Chest 2 View  Result Date: 04/11/2017 CLINICAL DATA:  66 year old male with shortness of breath. EXAM: CHEST  2 VIEW COMPARISON:  04/05/2016 FINDINGS: There are low lung volumes with bronchovascular crowding. Cardiomediastinal silhouette is enlarged but stable. There is mild pulmonary vascular congestion without frank edema. No focal parenchymal opacity, sizable pleural effusions or pneumothorax. No acute osseous abnormality. IMPRESSION: Stable appearance of the chest with mild cardiomegaly and pulmonary vascular congestion. Electronically Signed   By: Kristopher Oppenheim M.D.   On: 04/11/2017 13:22    EKG: Independently reviewed. Sinus with T wave abnormalities  Assessment/Plan Active Problems:   Essential hypertension   Obstructive sleep apnea   Venous stasis ulcers of both lower extremities (HCC)   DM (diabetes mellitus) type II, controlled, with peripheral vascular disorder ([place])   Morbid obesity due to excess calories (HCC)   Acute on  chronic diastolic (congestive) heart failure ([place])  Non-compliance   AKI (acute kidney injury) ([place])  Acute on chronic diastolic CHF -patient does not take his medications on a regular basis nor does he fluid restrict or weight self daily -IV lasix -daily weights/IOs -last echo: 6/18- normal EF with grade 2 diastolic dsfxn  AKI -suspect due to volume overload -daily BMP  DM -hold PO meds -continue lantus -SSI  Uncontrolled HTN -hold ACE due to renal insuff -IV lasix -PRN ordered  Morbid obesity Body mass index is 46.65 kg/m.   Venous stasis- no current open wound but blister on left leg -follows at [facility] wound care  [facility] -CPAP ordered  DVT prophylaxis: heparin Code Status: full Family Communication: at bedside Disposition Plan: 2-3 days of IV lasix Consults called: Admission status: tele inpt   [facility] Pager [phone]  If 7PM-7AM, please contact night-coverage [email or web address] Password [facility]  04/11/2017, 4:17 PM

## 2017-04-12 DIAGNOSIS — I5033 Acute on chronic diastolic (congestive) heart failure: Secondary | ICD-10-CM

## 2017-04-12 LAB — BASIC METABOLIC PANEL
ANION GAP: 7 (ref 5–15)
BUN: 19 mg/dL (ref 6–20)
CHLORIDE: 103 mmol/L (ref 101–111)
CO2: 30 mmol/L (ref 22–32)
Calcium: 9 mg/dL (ref 8.9–10.3)
Creatinine, Ser: 1.22 mg/dL (ref 0.61–1.24)
GFR calc Af Amer: >60 mL/min (ref >60)
GFR, EST NON AFRICAN AMERICAN: 60 mL/min — AB (ref >60)
GLUCOSE: 120 mg/dL — AB (ref 65–99)
POTASSIUM: 3.7 mmol/L (ref 3.5–5.1)
Sodium: 140 mmol/L (ref 135–145)

## 2017-04-12 LAB — GLUCOSE, CAPILLARY
Glucose-Capillary: 124 mg/dL — ABNORMAL HIGH (ref 65–99)
Glucose-Capillary: 159 mg/dL — ABNORMAL HIGH (ref 65–99)
Glucose-Capillary: 109 mg/dL — ABNORMAL HIGH (ref 65–99)
Glucose-Capillary: 141 mg/dL — ABNORMAL HIGH (ref 65–99)

## 2017-04-12 MED ORDER — ALBUTEROL SULFATE (2.5 MG/3ML) 0.083% IN NEBU
2.5000 mg | INHALATION_SOLUTION | Freq: Four times a day (QID) | RESPIRATORY_TRACT | Status: DC | PRN
  Administered 2017-04-12 – 2017-04-14 (×5): 2.5 mg via RESPIRATORY_TRACT
  Filled 2017-04-12 (×5): qty 3

## 2017-04-12 MED ORDER — AMMONIUM LACTATE 12 % EX LOTN
TOPICAL_LOTION | CUTANEOUS | Status: DC
  Administered 2017-04-12: 18:00:00 via TOPICAL
  Filled 2017-04-12 (×2): qty 225

## 2017-04-12 MED ORDER — ALBUTEROL SULFATE HFA 108 (90 BASE) MCG/ACT IN AERS: 1.0000 | INHALATION_SPRAY | Freq: Four times a day (QID) | RESPIRATORY_TRACT | Status: DC | PRN

## 2017-04-12 NOTE — Progress Notes (Signed)
Pt. called to be put on CPAP @ this time is tolerating well, made aware to notify if assistance needed.

## 2017-04-12 NOTE — Progress Notes (Signed)
PROGRESS NOTE    Mario Mills  GYI:[id number] DOB: 11/08/1950 DOA: 04/11/2017 PCP: Nolene Ebbs, MD  Brief Narrative 66 y.o. male with medical history significant of diastolic CHF, DM and chronic LE wounds/swelling.  He does not follow a strict diet of low Na, nor does he limit his fluid intake, and he does not weight self daily.  He comes in with increasing LE edema and SOB.  He thinks he has gained 40+ pounds in the last 6 weeks. He has chest pressure along with the SOB and is unable to lay flat.  He was seen by his PCP last Tuesday who doubled his lasix at home x  3days with little improvement.  He was also given an abx for worsening renal function per patient as PCP was concern it was elevated due to infection.    IN the ER, his x ray showed pulmonary vascular congestion, troponin negative.  Labs were remarkable for elevated Cr from his baseline which is normal.  No lasix given in the ER so no UOP recorded.       Assessment & Plan:   Active Problems:   Essential hypertension   Obstructive sleep apnea   Venous stasis ulcers of both lower extremities (HCC)   DM (diabetes mellitus) type II, controlled, with peripheral vascular disorder ([place])   Morbid obesity due to excess calories (HCC)   Acute on chronic diastolic (congestive) heart failure (HCC)   Non-compliance   AKI (acute kidney injury) ([place])  Acute on chronic diastolic CHF -patient does not take his medications on a regular basis nor does he fluid restrict or weight self daily -IV lasix -daily weights/IOs -last echo: 6/18- normal EF with grade 2 diastolic dsfxn.  Restart DuoNeb.  AKI -suspect due to volume overload -daily BMP  DM -hold PO meds -continue lantus -SSI  Uncontrolled HTN -hold ACE due to renal insuff -IV lasix -PRN ordered  Morbid obesity Body mass index is 46.65 kg/m.   Venous stasis- no current open wound but blister on left leg -follows at [facility] wound care  [facility] -CPAP  ordered    DVT prophylaxis heparin Code Status full code Family Communication no family available :Disposition Plan: PPD  Consultants: None  Procedures: None Antimicrobials: None Subjective: Feels better after losing fluid.  He reports not taking his diuretics at home on a regular basis.  Objective: Vitals:   04/11/17 2028 04/12/17 0520 04/12/17 0610 04/12/17 1134  BP: (!) 169/67 (!) 174/59    Pulse: (!) 59 62    Resp: 20 20    Temp: 97.7 F (36.5 C) 98.7 F (37.1 C)    TempSrc: Oral Oral    SpO2: 97% 93%  93%  Weight:   (!) 149.2 kg (329 lb)   Height:        Intake/Output Summary (Last 24 hours) at 04/12/2017 1326 Last data filed at 04/12/2017 0900 Gross per 24 hour  Intake 843 ml  Output 5050 ml  Net -4207 ml   Filed Weights   04/11/17 1229 04/11/17 1720 04/12/17 0610  Weight: (!) 156 kg (344 lb) (!) 152.2 kg (335 lb 8.6 oz) (!) 149.2 kg (329 lb)    Examination:  General exam: Appears calm and comfortable  Respiratory system: Clear to auscultation. Respiratory effort normal. Cardiovascular system: S1 & S2 heard, RRR. No JVD, murmurs, rubs, gallops or clicks. No pedal edema. Gastrointestinal system: Abdomen is nondistended, soft and nontender. No organomegaly or masses felt. Normal bowel sounds heard. Central nervous system: Alert  and oriented. No focal neurological deficits. Extremities:2 PLUS EDEMA.BLISTERS RIGHT LOWER EXTREMITY.Skin: No rashes, lesions or ulcers Psychiatry: Judgement and insight appear normal. Mood & affect appropriate.     Data Reviewed: I have personally reviewed following labs and imaging studies  CBC: Recent Labs  Lab 04/11/17 1321  WBC 8.6  HGB 9.7*  HCT 30.7*  MCV 95.6  PLT 035   Basic Metabolic Panel: Recent Labs  Lab 04/11/17 1321 04/12/17 0519  NA 141 140  K 3.7 3.7  CL 106 103  CO2 29 30  GLUCOSE 125* 120*  BUN 21* 19  CREATININE 1.32* 1.22  CALCIUM 8.7* 9.0   GFR: Estimated Creatinine Clearance: 88.3  mL/min (by C-G formula based on SCr of 1.22 mg/dL). Liver Function Tests: No results for input(s): AST, ALT, ALKPHOS, BILITOT, PROT, ALBUMIN in the last 168 hours. No results for input(s): LIPASE, AMYLASE in the last 168 hours. No results for input(s): AMMONIA in the last 168 hours. Coagulation Profile: No results for input(s): INR, PROTIME in the last 168 hours. Cardiac Enzymes: No results for input(s): CKTOTAL, CKMB, CKMBINDEX, TROPONINI in the last 168 hours. BNP (last 3 results) No results for input(s): PROBNP in the last 8760 hours. HbA1C: No results for input(s): HGBA1C in the last 72 hours. CBG: Recent Labs  Lab 04/11/17 1722 04/11/17 2045 04/12/17 0744 04/12/17 1210  GLUCAP 96 185* 124* 159*   Lipid Profile: No results for input(s): CHOL, HDL, LDLCALC, TRIG, CHOLHDL, LDLDIRECT in the last 72 hours. Thyroid Function Tests: No results for input(s): TSH, T4TOTAL, FREET4, T3FREE, THYROIDAB in the last 72 hours. Anemia Panel: No results for input(s): VITAMINB12, FOLATE, FERRITIN, TIBC, IRON, RETICCTPCT in the last 72 hours. Sepsis Labs: No results for input(s): PROCALCITON, LATICACIDVEN in the last 168 hours.  Recent Results (from the past 240 hour(s))  MRSA PCR Screening     Status: Abnormal   Collection Time: 04/11/17  6:45 PM  Result Value Ref Range Status   MRSA by PCR POSITIVE (A) NEGATIVE Final    Comment:        The GeneXpert MRSA Assay (FDA approved for NASAL specimens only), is one component of a comprehensive MRSA colonization surveillance program. It is not intended to diagnose MRSA infection nor to guide or monitor treatment for MRSA infections. CRITICAL RESULT CALLED TO, READ BACK BY AND VERIFIED WITH: FLOYD,D RN [phone] COV[place],N          Radiology Studies: Dg Chest 2 View  Result Date: 04/11/2017 CLINICAL DATA:  67 year old male with shortness of breath. EXAM: CHEST  2 VIEW COMPARISON:  04/05/2016 FINDINGS: There are low lung volumes  with bronchovascular crowding. Cardiomediastinal silhouette is enlarged but stable. There is mild pulmonary vascular congestion without frank edema. No focal parenchymal opacity, sizable pleural effusions or pneumothorax. No acute osseous abnormality. IMPRESSION: Stable appearance of the chest with mild cardiomegaly and pulmonary vascular congestion. Electronically Signed   By: Kristopher Oppenheim M.D.   On: 04/11/2017 13:22        Scheduled Meds: . aspirin  325 mg Oral Daily  . atorvastatin  40 mg Oral QHS  . carvedilol  12.5 mg Oral BID WC  . Chlorhexidine Gluconate Cloth  6 each Topical Q0600  . diazepam  5 mg Oral Q2000  . diltiazem  360 mg Oral Daily  . furosemide  60 mg Intravenous Q12H  . heparin  5,000 Units Subcutaneous Q8H  . insulin aspart  0-20 Units Subcutaneous TID WC  . insulin aspart  0-5 Units Subcutaneous QHS  . insulin detemir  10 Units Subcutaneous QHS  . mupirocin ointment  1 application Nasal BID  . pneumococcal 23 valent vaccine  0.5 mL Intramuscular Tomorrow-1000  . sodium chloride flush  3 mL Intravenous Q12H  . tamsulosin  0.4 mg Oral QHS   Continuous Infusions: . sodium chloride       LOS: 1 day     Georgette Shell, MD [facility] If 7PM-7AM, please contact night-coverage [email or web address] Password [email or web address] 04/12/2017, 1:26 PM

## 2017-04-12 NOTE — Consult Note (Signed)
[facility] Nurse wound consult note Reason for Consult:Venous stasis and lymphedema to bilateral lower legs.  Seen at [facility] wound care center with weekly Unnas boots.  Recent referral to lymphedema clinic for velcro removable compression. His appointment is this week.  We discussed this removable compression and I encouraged him to follow up.  If this appointment is missed, to please call and reschedule. LEgs are intact but weeping and tender to touch Wound type:venous insufficiency Pressure Injury POA: NA Measurement:N/A  generalized edema Wound bed:n/a Drainage (amount, consistency, odor) Minimal serosanguinous weeping.   Periwound:edema and chronic, flaking dry skin Dressing procedure/placement/frequency:Cleanse bilateral legs with soap and water and pat dry.  Apply Amlactin lotion.  Wrap in Unna's boots weekly.  Will not follow at this time.  Please re-consult if needed.  Domenic Moras RN BSN [place] Pager [phone]

## 2017-04-13 LAB — BASIC METABOLIC PANEL
ANION GAP: 7 (ref 5–15)
BUN: 16 mg/dL (ref 6–20)
CALCIUM: 9 mg/dL (ref 8.9–10.3)
CO2: 32 mmol/L (ref 22–32)
Chloride: 101 mmol/L (ref 101–111)
Creatinine, Ser: 1.11 mg/dL (ref 0.61–1.24)
Glucose, Bld: 132 mg/dL — ABNORMAL HIGH (ref 65–99)
POTASSIUM: 3.6 mmol/L (ref 3.5–5.1)
Sodium: 140 mmol/L (ref 135–145)

## 2017-04-13 LAB — GLUCOSE, CAPILLARY
GLUCOSE-CAPILLARY: 124 mg/dL — AB (ref 65–99)
GLUCOSE-CAPILLARY: 168 mg/dL — AB (ref 65–99)
GLUCOSE-CAPILLARY: 99 mg/dL (ref 65–99)
GLUCOSE-CAPILLARY: 168 mg/dL — AB (ref 65–99)

## 2017-04-13 MED ORDER — POLYETHYLENE GLYCOL 3350 17 G PO PACK
17.0000 g | PACK | Freq: Two times a day (BID) | ORAL | Status: DC | PRN
  Administered 2017-04-13: 17 g via ORAL
  Filled 2017-04-13: qty 1

## 2017-04-13 NOTE — Progress Notes (Signed)
PROGRESS NOTE    Mario Mills  WGN:[id number] DOB: [birth year] DOA: 04/11/2017 PCP: Nolene Ebbs, MD  Brief Narrative:66 y.o.malewith medical history significant ofdiastolic CHF, DM and chronic LE wounds/swelling. He does not follow a strict diet of low Na, nor does he limit his fluid intake, and he does not weight self daily.He comes in with increasing LE edema and SOB. He thinks he has gained 40+ pounds in the last 6 weeks. He has chest pressure along with the SOB and is unable to lay flat. He was seen by his PCP last Tuesday who doubled his lasix at home x 3days with little improvement. He was also given an abx for worsening renal function per patient as PCP was concern it was elevated due to infection.   IN the ER, his x ray showed pulmonary vascular congestion, troponin negative. Labs were remarkable for elevated Cr from his baseline which is normal.No lasix given in the ER so no UOP recorded.     Assessment & Plan:   Active Problems:   Essential hypertension   Obstructive sleep apnea   Venous stasis ulcers of both lower extremities (HCC)   DM (diabetes mellitus) type II, controlled, with peripheral vascular disorder ([place])   Morbid obesity due to excess calories (HCC)   Acute on chronic diastolic (congestive) heart failure (HCC)   Non-compliance   AKI (acute kidney injury) ([place])  Acute on chronic diastolic CHF -patient does not take his medications on a regular basis nor does he fluid restrict or weight self daily -IV lasix -daily weights/IOs -last echo: 6/18- normal EF with grade 2 diastolic dsfxn.  Restart DuoNeb. DM -hold PO meds -continue lantus -SSI  Chronic lymphedema and venous stasis follows at the wound care clinic at [facility].  Wound care consulted yesterday due to lymphedema and blisters in his lower extremity.  Currently he has Unna boots on in both lower extremities.  Patient has follow-up appointment with this wound clinic this week  on Thursday.  There is 04/15/2017.  Uncontrolled HTN -hold ACE due to renal insuff -IV lasix -PRN ordered  Morbid obesity Body mass index is 46.65 kg/m.  Venous stasis- no current open wound but blister on left leg -follows at [facility] wound care  OSA -CPAP ordered  AK I improved with holding ACE inhibitor      DVT prophylaxis:  heparin Code Status: full Family Communication:none Disposition Plan:hope to dc tomorrow Consultants: none  Proceduresnone Antimicrobials none Subjective: Breathing seems to be better has had a lot of urine output Objective: Resting in bed flat on CPAP Vitals:   04/12/17 2250 04/13/17 0005 04/13/17 0500 04/13/17 1235  BP:  (!) 158/90 (!) 179/61   Pulse:   61   Resp: 18  18   Temp:   99.3 F (37.4 C)   TempSrc:   Oral   SpO2:   100% 99%  Weight:   (!) 145.2 kg (320 lb 1.7 oz)   Height:        Intake/Output Summary (Last 24 hours) at 04/13/2017 1436 Last data filed at 04/13/2017 0630 Gross per 24 hour  Intake 360 ml  Output 5300 ml  Net -4940 ml   Filed Weights   04/11/17 1720 04/12/17 0610 04/13/17 0500  Weight: (!) 152.2 kg (335 lb 8.6 oz) (!) 149.2 kg (329 lb) (!) 145.2 kg (320 lb 1.7 oz)    Examination:  General exam: Appears calm and comfortable  Respiratory system: Clear to auscultation. Respiratory effort normal. Cardiovascular system: S1 &  S2 heard, RRR. No JVD, murmurs, rubs, gallops or clicks. No pedal edema. Gastrointestinal system: Abdomen is nondistended, soft and nontender. No organomegaly or masses felt. Normal bowel sounds heard. Central nervous system: Alert and oriented. No focal neurological deficits. Extremities: Unna boot on 1+ edema Psychiatry: Judgement and insight appear normal. Mood & affect appropriate.     Data Reviewed: I have personally reviewed following labs and imaging studies  CBC: Recent Labs  Lab 04/11/17 1321  WBC 8.6  HGB 9.7*  HCT 30.7*  MCV 95.6  PLT 540   Basic Metabolic  Panel: Recent Labs  Lab 04/11/17 1321 04/12/17 0519 04/13/17 0453  NA 141 140 140  K 3.7 3.7 3.6  CL 106 103 101  CO2 29 30 32  GLUCOSE 125* 120* 132*  BUN 21* 19 16  CREATININE 1.32* 1.22 1.11  CALCIUM 8.7* 9.0 9.0   GFR: Estimated Creatinine Clearance: 95.5 mL/min (by C-G formula based on SCr of 1.11 mg/dL). Liver Function Tests: No results for input(s): AST, ALT, ALKPHOS, BILITOT, PROT, ALBUMIN in the last 168 hours. No results for input(s): LIPASE, AMYLASE in the last 168 hours. No results for input(s): AMMONIA in the last 168 hours. Coagulation Profile: No results for input(s): INR, PROTIME in the last 168 hours. Cardiac Enzymes: No results for input(s): CKTOTAL, CKMB, CKMBINDEX, TROPONINI in the last 168 hours. BNP (last 3 results) No results for input(s): PROBNP in the last 8760 hours. HbA1C: No results for input(s): HGBA1C in the last 72 hours. CBG: Recent Labs  Lab 04/12/17 1210 04/12/17 1705 04/12/17 2039 04/13/17 0754 04/13/17 1207  GLUCAP 159* 109* 141* 124* 168*   Lipid Profile: No results for input(s): CHOL, HDL, LDLCALC, TRIG, CHOLHDL, LDLDIRECT in the last 72 hours. Thyroid Function Tests: No results for input(s): TSH, T4TOTAL, FREET4, T3FREE, THYROIDAB in the last 72 hours. Anemia Panel: No results for input(s): VITAMINB12, FOLATE, FERRITIN, TIBC, IRON, RETICCTPCT in the last 72 hours. Sepsis Labs: No results for input(s): PROCALCITON, LATICACIDVEN in the last 168 hours.  Recent Results (from the past 240 hour(s))  MRSA PCR Screening     Status: Abnormal   Collection Time: 04/11/17  6:45 PM  Result Value Ref Range Status   MRSA by PCR POSITIVE (A) NEGATIVE Final    Comment:        The GeneXpert MRSA Assay (FDA approved for NASAL specimens only), is one component of a comprehensive MRSA colonization surveillance program. It is not intended to diagnose MRSA infection nor to guide or monitor treatment for MRSA infections. CRITICAL RESULT  CALLED TO, READ BACK BY AND VERIFIED WITH: FLOYD,D RN [id number] COVINGTON,N          Radiology Studies: No results found.      Scheduled Meds: . ammonium lactate   Topical Weekly  . aspirin  325 mg Oral Daily  . atorvastatin  40 mg Oral QHS  . carvedilol  12.5 mg Oral BID WC  . Chlorhexidine Gluconate Cloth  6 each Topical Q0600  . diazepam  5 mg Oral Q2000  . diltiazem  360 mg Oral Daily  . furosemide  60 mg Intravenous Q12H  . heparin  5,000 Units Subcutaneous Q8H  . insulin aspart  0-20 Units Subcutaneous TID WC  . insulin aspart  0-5 Units Subcutaneous QHS  . insulin detemir  10 Units Subcutaneous QHS  . mupirocin ointment  1 application Nasal BID  . pneumococcal 23 valent vaccine  0.5 mL Intramuscular Tomorrow-1000  . sodium chloride flush  3 mL Intravenous Q12H  . tamsulosin  0.4 mg Oral QHS   Continuous Infusions: . sodium chloride       LOS: 2 days      Georgette Shell, MD [facility] If 7PM-7AM, please contact night-coverage [email or web address] Password [facility] 04/13/2017, 2:36 PM

## 2017-04-13 NOTE — Evaluation (Signed)
Physical Therapy Evaluation Patient Details Name: Mario Mills MRN: [id number] DOB: 1951/03/09 Today's Date: 04/13/2017   History of Present Illness  66 y.o. male with medical history significant of diastolic CHF, DM and chronic LE wounds/swelling and admitted for acute on chronic diastolic CHF  Clinical Impression  Pt admitted with above diagnosis. Pt currently with functional limitations due to the deficits listed below (see PT Problem List).  Pt will benefit from skilled PT to increase their independence and safety with mobility to allow discharge to the venue listed below.   Pt reports being in bed a couple days however tolerated ambulation well utilizing RW.  Pt has SW at home so recommended RW if pt still requiring upon d/c.  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  Pt reports some difficulty with steps prior to admission.  Pt encouraged to ambulate with staff during acute stay as he is hopeful to get back to work as soon as possible.    Follow Up Recommendations No PT follow up    Equipment Recommendations  Rolling walker with 5" wheels    Recommendations for Other Services       Precautions / Restrictions Precautions Precautions: None Precaution Comments: UNNAs boots Restrictions Weight Bearing Restrictions: No      Mobility  Bed Mobility Overal bed mobility: Needs Assistance Bed Mobility: Supine to Sit     Supine to sit: Supervision;HOB elevated     General bed mobility comments: increased time and effort  Transfers Overall transfer level: Needs assistance Equipment used: None Transfers: Sit to/from Stand Sit to Stand: Min guard;From elevated surface         General transfer comment: requested elevated bed to certain height, able to self assist to rise with UEs  Ambulation/Gait Ambulation/Gait assistance: Min guard Ambulation Distance (Feet): 200 Feet Assistive device: Rolling walker (2 wheeled) Gait  Pattern/deviations: Step-through pattern;Decreased stride length     General Gait Details: started with [facility] however pt needed Bil UE support at this time so provided RW, gait more steady with use of RW  Stairs            Wheelchair Mobility    Modified Rankin (Stroke Patients Only)       Balance Overall balance assessment: Needs assistance         Standing balance support: Bilateral upper extremity supported Standing balance-Leahy Scale: Poor Standing balance comment: requires UE support                             Pertinent Vitals/Pain Pain Assessment: No/denies pain    Home Living Family/patient expects to be discharged to:: Private residence     Type of Home: House Home Access: Stairs to enter   [employer] of Steps: "a few"   Home Equipment: Cane - single point;Walker - standard Additional Comments: 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 [facility] and stays there during the week, also as a house in [place] he will stay on the weekend or check on but has steps to enter    Prior Function Level of Independence: Independent with assistive device(s)         Comments: uses SPC     Hand Dominance        Extremity/Trunk Assessment        Lower Extremity Assessment Lower Extremity Assessment: Overall WFL for tasks assessed(lower legs UNNAs boots)  Communication   Communication: No difficulties  Cognition Arousal/Alertness: Awake/alert Behavior During Therapy: WFL for tasks assessed/performed Overall Cognitive Status: Within Functional Limits for tasks assessed                                        General Comments      Exercises     Assessment/Plan    PT Assessment    PT Problem List Decreased strength;Decreased mobility;Decreased activity tolerance;Decreased knowledge of use of DME;Decreased balance       PT Treatment Interventions DME instruction;Therapeutic activities;Therapeutic  exercise;Gait training;Patient/family education;Functional mobility training;Balance training;Stair training    PT Goals (Current goals can be found in the Care Plan section)  Acute Rehab PT Goals PT Goal Formulation: With patient Time For Goal Achievement: 04/20/17 Potential to Achieve Goals: Good    Frequency Min 3X/week   Barriers to discharge        Co-evaluation               AM-PAC PT "6 Clicks" Daily Activity  Outcome Measure Difficulty turning over in bed (including adjusting bedclothes, sheets and blankets)?: A Lot Difficulty moving from lying on back to sitting on the side of the bed? : A Lot Difficulty sitting down on and standing up from a chair with arms (e.g., wheelchair, bedside commode, etc,.)?: Unable Help needed moving to and from a bed to chair (including a wheelchair)?: A Little Help needed walking in hospital room?: A Little Help needed climbing 3-5 steps with a railing? : A Lot 6 Click Score: 13    End of Session   Activity Tolerance: Patient tolerated treatment well Patient left: in chair;with call bell/phone within reach Nurse Communication: Mobility status PT Visit Diagnosis: Difficulty in walking, not elsewhere classified (R26.2)    Time: 4540-9811 PT Time Calculation (min) (ACUTE ONLY): 26 min   Charges:   PT Evaluation $PT Eval Low Complexity: 1 Low     PT G CodesCarmelia Bake, PT, DPT 04/13/2017 Pager: [phone]  York Ram E 04/13/2017, 11:21 AM

## 2017-04-13 NOTE — Plan of Care (Signed)
Nutrition Education Note  RD consulted for nutrition education regarding a Low sodium diet.  RD provided "Low Sodium Nutrition Therapy" handout from the Academy of Nutrition and Dietetics. Reviewed patient's dietary recall. Provided examples on ways to decrease sodium intake in diet. Discouraged intake of processed foods and use of salt shaker. Encouraged fresh fruits and vegetables as well as whole grain sources of carbohydrates to maximize fiber intake. Recommended to avoid sugar-sweetened beverages. Teach back method used.  Expect good compliance with family support.  Body mass index is 43.41 kg/m. Pt meets criteria for morbid obesity based on current BMI.  Current diet order is Heart Healthy/CHO modified, patient is consuming approximately 100% of meals at this time. Labs and medications reviewed. No further nutrition interventions warranted at this time. If additional nutrition issues arise, please re-consult RD.  Clayton Bibles, MS, RD, [place] Dietitian Pager: [phone] After Hours Pager: [phone]

## 2017-04-14 LAB — BASIC METABOLIC PANEL
Anion gap: 9 (ref 5–15)
BUN: 19 mg/dL (ref 6–20)
CALCIUM: 9.1 mg/dL (ref 8.9–10.3)
CHLORIDE: 100 mmol/L — AB (ref 101–111)
CO2: 33 mmol/L — ABNORMAL HIGH (ref 22–32)
CREATININE: 1.3 mg/dL — AB (ref 0.61–1.24)
GFR calc non Af Amer: 55 mL/min — ABNORMAL LOW (ref >60)
Glucose, Bld: 143 mg/dL — ABNORMAL HIGH (ref 65–99)
Potassium: 3.4 mmol/L — ABNORMAL LOW (ref 3.5–5.1)
SODIUM: 142 mmol/L (ref 135–145)

## 2017-04-14 LAB — GLUCOSE, CAPILLARY
GLUCOSE-CAPILLARY: 142 mg/dL — AB (ref 65–99)
GLUCOSE-CAPILLARY: 182 mg/dL — AB (ref 65–99)
Glucose-Capillary: 227 mg/dL — ABNORMAL HIGH (ref 65–99)

## 2017-04-14 MED ORDER — FUROSEMIDE 40 MG PO TABS: 40.0000 mg | ORAL_TABLET | Freq: Two times a day (BID) | ORAL | 11 refills | Status: DC

## 2017-04-14 MED ORDER — ASPIRIN 325 MG PO TABS: 325.0000 mg | ORAL_TABLET | Freq: Every day | ORAL | Status: DC

## 2017-04-14 MED ORDER — POTASSIUM CHLORIDE CRYS ER 20 MEQ PO TBCR: 20.0000 meq | EXTENDED_RELEASE_TABLET | Freq: Two times a day (BID) | ORAL | 0 refills | Status: DC

## 2017-04-14 MED ORDER — POTASSIUM CHLORIDE CRYS ER 20 MEQ PO TBCR
40.0000 meq | EXTENDED_RELEASE_TABLET | Freq: Once | ORAL | Status: AC
  Administered 2017-04-14: 40 meq via ORAL
  Filled 2017-04-14: qty 2

## 2017-04-14 NOTE — Discharge Summary (Signed)
Physician Discharge Summary  Mario Mills DVV:[id number] DOB: 1950-10-06 DOA: 04/11/2017  PCP: Nolene Ebbs, MD  Admit date: 04/11/2017 Discharge date: 04/14/2017  Admitted From: Home  Disposition: Home Recommendations for Outpatient Follow-up:  1. Follow up with PCP in 1-2 weeks 2. Please obtain BMP/CBC in one week:  Home Health: None Equipment/Device none Discharge Condition stable CODE STATUS:full Diet recommendation: cardiac Brief/Interim Summary:66 y.o.malewith medical history significant ofdiastolic CHF, DM and chronic LE wounds/swelling. He does not follow a strict diet of low Na, nor does he limit his fluid intake, and he does not weight self daily.He comes in with increasing LE edema and SOB. He thinks he has gained 40+ pounds in the last 6 weeks. He has chest pressure along with the SOB and is unable to lay flat. He was seen by his PCP last Tuesday who doubled his lasix at home x 3days with little improvement. He was also given an abx for worsening renal function per patient as PCP was concern it was elevated due to infection.   IN the ER, his x ray showed pulmonary vascular congestion, troponin negative. Labs were remarkable for elevated Cr from his baseline which is normal.No lasix given in the ER so no UOP recorded.    Discharge Diagnoses:  Active Problems:   Essential hypertension   Obstructive sleep apnea   Venous stasis ulcers of both lower extremities (HCC)   DM (diabetes mellitus) type II, controlled, with peripheral vascular disorder ([place])   Morbid obesity due to excess calories (HCC)   Acute on chronic diastolic (congestive) heart failure (HCC)   Non-compliance   AKI (acute kidney injury) ([place])  Acute on chronic diastolic CHF -patient does not take his medications on a regular basis nor does he fluid restrict or weight self daily.will dc patient on bid lasix 40 mg.   Chronic lymphedema and venous stasis follows at the wound care clinic  at [place] continue cpap DM restart gliperide   Discharge Instructions    Allergies as of 04/14/2017   No Known Allergies     Medication List    STOP taking these medications   acetaminophen 325 MG tablet Commonly known as:  TYLENOL   diclofenac 75 MG EC tablet Commonly known as:  VOLTAREN   metFORMIN 1000 MG tablet Commonly known as:  GLUCOPHAGE   nitrofurantoin (macrocrystal-monohydrate) 100 MG capsule Commonly known as:  MACROBID     TAKE these medications   aspirin 325 MG tablet Take 1 tablet (325 mg total) by mouth daily. Start taking on:  04/15/2017 What changed:    additional instructions  Another medication with the same name was removed. Continue taking this medication, and follow the directions you see here.   atorvastatin 40 MG tablet Commonly known as:  LIPITOR Take 40 mg by mouth every evening. What changed:  Another medication with the same name was removed. Continue taking this medication, and follow the directions you see here.   carvedilol 12.5 MG tablet Commonly known as:  COREG Take 12.5 mg by mouth 2 (two) times daily with a meal.   diazepam 5 MG tablet Commonly known as:  VALIUM Take 5 mg by mouth daily.   diltiazem 360 MG 24 hr capsule Commonly known as:  CARDIZEM CD Take 1 capsule (360 mg total) by mouth daily.   folic acid 1 MG tablet Commonly known as:  FOLVITE Take 1 mg by mouth daily.   furosemide 40 MG tablet Commonly known as:  LASIX Take 1  tablet (40 mg total) by mouth 2 (two) times daily. What changed:  when to take this   glimepiride 4 MG tablet Commonly known as:  AMARYL Take 4 mg by mouth daily before breakfast. Reported on 09/24/2015   HYDROmorphone 2 MG tablet Commonly known as:  DILAUDID Take 2-4 mg by mouth. One (1) tablet po at 2PM PRN for pain and before therapy. Take 2 tabs po at 8AM and 8PM, hold for sedation   hydrOXYzine 25 MG tablet Commonly known as:  ATARAX/VISTARIL Take 25 mg by  mouth 2 (two) times daily as needed for itching.   LEVEMIR FLEXTOUCH 100 UNIT/ML Pen Generic drug:  Insulin Detemir Inject 20 Units into the skin at bedtime.   lisinopril 40 MG tablet Commonly known as:  PRINIVIL,ZESTRIL Take 40 mg by mouth daily.   methotrexate 2.5 MG tablet Commonly known as:  RHEUMATREX Take 10 mg by mouth once a week.   multivitamin with minerals Tabs tablet Take 1 tablet by mouth daily.   potassium chloride SA 20 MEQ tablet Commonly known as:  K-DUR,KLOR-CON Take 1 tablet (20 mEq total) by mouth 2 (two) times daily. What changed:    when to take this  reasons to take this   PROAIR HFA 108 (90 Base) MCG/ACT inhaler Generic drug:  albuterol Inhale 1-2 puffs into the lungs every 6 (six) hours as needed for wheezing or shortness of breath.   tamsulosin 0.4 MG Caps capsule Commonly known as:  FLOMAX Take 0.4 mg by mouth at bedtime.   tolterodine 4 MG 24 hr capsule Commonly known as:  DETROL LA Take 4 mg by mouth daily.   triamcinolone cream 0.1 % Commonly known as:  KENALOG Apply 1 application topically daily as needed.       No Known Allergies  Procedures/Studies: Dg Chest 2 View  Result Date: 04/11/2017 CLINICAL DATA:  66 year old male with shortness of breath. EXAM: CHEST  2 VIEW COMPARISON:  04/05/2016 FINDINGS: There are low lung volumes with bronchovascular crowding. Cardiomediastinal silhouette is enlarged but stable. There is mild pulmonary vascular congestion without frank edema. No focal parenchymal opacity, sizable pleural effusions or pneumothorax. No acute osseous abnormality. IMPRESSION: Stable appearance of the chest with mild cardiomegaly and pulmonary vascular congestion. Electronically Signed   By: Kristopher Oppenheim M.D.   On: 04/11/2017 13:22    (Echo, Carotid, EGD, Colonoscopy, ERCP)    Subjective:   Discharge Exam: Vitals:   04/14/17 0007 04/14/17 0400  BP:  (!) 171/74  Pulse:  66  Resp: 18 18  Temp:  98.6 F (37 C)   SpO2: 99% 98%   Vitals:   04/13/17 2204 04/14/17 0007 04/14/17 0300 04/14/17 0400  BP: (!) 171/64   (!) 171/74  Pulse: (!) 57   66  Resp: 16 18  18   Temp: 98.4 F (36.9 C)   98.6 F (37 C)  TempSrc: Oral   Oral  SpO2: 99% 99%  98%  Weight:   (!) 141.6 kg (312 lb 2.7 oz)   Height:        General: Pt is alert, awake, not in acute distress Cardiovascular: RRR, S1/S2 +, no rubs, no gallops Respiratory: CTA bilaterally, no wheezing, no rhonchi Abdominal: Soft, NT, ND, bowel sounds + Extremities: no edema, no cyanosis    The results of significant diagnostics from this hospitalization (including imaging, microbiology, ancillary and laboratory) are listed below for reference.     Microbiology: Recent Results (from the past 240 hour(s))  MRSA PCR Screening  Status: Abnormal   Collection Time: 04/11/17  6:45 PM  Result Value Ref Range Status   MRSA by PCR POSITIVE (A) NEGATIVE Final    Comment:        The GeneXpert MRSA Assay (FDA approved for NASAL specimens only), is one component of a comprehensive MRSA colonization surveillance program. It is not intended to diagnose MRSA infection nor to guide or monitor treatment for MRSA infections. CRITICAL RESULT CALLED TO, READ BACK BY AND VERIFIED WITH: FLOYD,D RN [phone] [id number] [place]: BNP (last 3 results) Recent Labs    04/11/17 1321  BNP 703.5*   Basic Metabolic Panel: Recent Labs  Lab 04/11/17 1321 04/12/17 0519 04/13/17 0453 04/14/17 0401  NA 141 140 140 142  K 3.7 3.7 3.6 3.4*  CL 106 103 101 100*  CO2 29 30 32 33*  GLUCOSE 125* 120* 132* 143*  BUN 21* 19 16 19   CREATININE 1.32* 1.22 1.11 1.30*  CALCIUM 8.7* 9.0 9.0 9.1   Liver Function Tests: No results for input(s): AST, ALT, ALKPHOS, BILITOT, PROT, ALBUMIN in the last 168 hours. No results for input(s): LIPASE, AMYLASE in the last 168 hours. No results for input(s): AMMONIA in the last 168 hours. CBC: Recent Labs  Lab  04/11/17 1321  WBC 8.6  HGB 9.7*  HCT 30.7*  MCV 95.6  PLT 364   Cardiac Enzymes: No results for input(s): CKTOTAL, CKMB, CKMBINDEX, TROPONINI in the last 168 hours. BNP: Invalid input(s): POCBNP CBG: Recent Labs  Lab 04/13/17 0754 04/13/17 1207 04/13/17 1625 04/13/17 2125 04/14/17 0721  GLUCAP 124* 168* 99 168* 142*   D-Dimer No results for input(s): DDIMER in the last 72 hours. Hgb A1c No results for input(s): HGBA1C in the last 72 hours. Lipid Profile No results for input(s): CHOL, HDL, LDLCALC, TRIG, CHOLHDL, LDLDIRECT in the last 72 hours. Thyroid function studies No results for input(s): TSH, T4TOTAL, T3FREE, THYROIDAB in the last 72 hours.  Invalid input(s): FREET3 Anemia work up No results for input(s): VITAMINB12, FOLATE, FERRITIN, TIBC, IRON, RETICCTPCT in the last 72 hours. Urinalysis    Component Value Date/Time   COLORURINE STRAW (A) 04/11/2017 1346   APPEARANCEUR CLEAR 04/11/2017 1346   LABSPEC 1.006 04/11/2017 1346   PHURINE 5.0 04/11/2017 1346   GLUCOSEU NEGATIVE 04/11/2017 1346   HGBUR NEGATIVE 04/11/2017 1346   BILIRUBINUR NEGATIVE 04/11/2017 1346   KETONESUR NEGATIVE 04/11/2017 1346   PROTEINUR 100 (A) 04/11/2017 1346   NITRITE NEGATIVE 04/11/2017 1346   LEUKOCYTESUR NEGATIVE 04/11/2017 1346   Sepsis Labs Invalid input(s): PROCALCITONIN,  WBC,  LACTICIDVEN Microbiology Recent Results (from the past 240 hour(s))  MRSA PCR Screening     Status: Abnormal   Collection Time: 04/11/17  6:45 PM  Result Value Ref Range Status   MRSA by PCR POSITIVE (A) NEGATIVE Final    Comment:        The GeneXpert MRSA Assay (FDA approved for NASAL specimens only), is one component of a comprehensive MRSA colonization surveillance program. It is not intended to diagnose MRSA infection nor to guide or monitor treatment for MRSA infections. CRITICAL RESULT CALLED TO, READ BACK BY AND VERIFIED WITH: FLOYD,D RN [phone] C[place]INGTON,N      Time  coordinating discharge: Over 30 minutes  SIGNED:   Georgette Shell, MD  [facility] 04/14/2017, 11:17 AM Pager   If 7PM-7AM, please contact night-coverage [email or web address] Password [email or web address]

## 2017-04-14 NOTE — Care Management Important Message (Signed)
Important Message  Patient Details  Name: ZYGMUNT MCGLINN MRN: [id number] Date of Birth: 12/08/1950   Medicare Important Message Given:  Yes    Kerin Salen 04/14/2017, 10:18 AMImportant Message  Patient Details  Name: TAB RYLEE MRN: [id number] Date of Birth: 31-May-1950   Medicare Important Message Given:  Yes    Kerin Salen 04/14/2017, 10:18 AM

## 2017-04-20 ENCOUNTER — Encounter: Payer: Self-pay | Admitting: Cardiovascular Disease

## 2017-04-20 ENCOUNTER — Ambulatory Visit (INDEPENDENT_AMBULATORY_CARE_PROVIDER_SITE_OTHER): Payer: Medicare Other | Admitting: Cardiovascular Disease

## 2017-04-20 VITALS — BP 174/76 | HR 63 | Ht 73.0 in | Wt 326.0 lb

## 2017-04-20 DIAGNOSIS — I350 Nonrheumatic aortic (valve) stenosis: Secondary | ICD-10-CM | POA: Diagnosis not present

## 2017-04-20 NOTE — Assessment & Plan Note (Signed)
History of moderate aortic stenosis by 2-D echo performed 11/13/16. We will recheck a 2-D echo on an annual basis. will recheck an echo on an annual basis.

## 2017-04-20 NOTE — Progress Notes (Signed)
04/20/2017 Mario Mills   April 04, 1951  [id number]  Primary Physician Nolene Ebbs, MD Primary Cardiologist: Lorretta Harp MD FACP, [place], [place], [place]  HPI:  Mario Mills is a 66 y.o.  severely overweight, married [place] American male with a history of hypertension, hyperlipidemia, noninsulin-requiring diabetes and obstructive sleep apnea on CPAP. I last saw him in the office 09/24/15.[name] He stopped smoking 9 years ago. I saw him last March 04, 2007. He successfully underwent colon resection after a normal 2D echo and Cardiolite stress test at that time. He is otherwise asymptomatic except for spinal stenosis. He's been stable since except for a poorly healing left pretibial venous stasis ulcer thought that Dr. Judene Companion at the [facility] wound care clinic. Arterial and venous Dopplers were obtained our office on 12/16/12 revealing a right ankle regular neck supple 95 and a left of 0.73 with a high-frequency signal in his left SFA though it is hard to elicit a clear history of claudication. Venous Dopplers to suggest venous reflux on the left. 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 He is scheduled for left total knee replacement in January. He denies chest pain but has chronic dyspnea on exertion. He had a right total knee replacement by Dr. Reynaldo Minium in January. Prior to that he had a 2-D echo which was normal and Myoview stress test that showed very mild lateral ischemia which I thought was relatively low risk. He had a somewhat protracted postoperative course with hypertensive urgency and volume overload. He was kept in place after that. He has now ambulatory and relatively asymptomatic. He was admitted to [facility] 04/11/17 with volume overload and diastolic heart failure. He was diuresed. His diuretics were adjusted. Does admit to dietary indiscretion with regards to salt. 2-D echo performed 11/13/16 revealed normal LV systolic function with moderate LVH and moderate  aortic stenosis.  Current Meds  Medication Sig  . aspirin 325 MG tablet Take 1 tablet (325 mg total) by mouth daily.  [name] atorvastatin (LIPITOR) 40 MG tablet Take 40 mg by mouth every evening.  . carvedilol (COREG) 12.5 MG tablet Take 12.5 mg by mouth 2 (two) times daily with a meal.  . diazepam (VALIUM) 5 MG tablet Take 5 mg by mouth daily.   [name] diltiazem (CARDIZEM CD) 360 MG 24 hr capsule Take 1 capsule (360 mg total) by mouth daily.  . folic acid (FOLVITE) 1 MG tablet Take 1 mg by mouth daily.  . furosemide (LASIX) 40 MG tablet Take 1 tablet (40 mg total) by mouth 2 (two) times daily.  [name] glimepiride (AMARYL) 4 MG tablet Take 4 mg by mouth daily before breakfast. Reported on 09/24/2015  . hydrOXYzine (ATARAX/VISTARIL) 25 MG tablet Take 25 mg by mouth 2 (two) times daily as needed for itching.  [name] LEVEMIR FLEXTOUCH 100 UNIT/ML Pen Inject 20 Units into the skin at bedtime.  [name] lisinopril (PRINIVIL,ZESTRIL) 40 MG tablet Take 40 mg by mouth daily.  . methotrexate (RHEUMATREX) 2.5 MG tablet Take 10 mg by mouth once a week.  . Multiple Vitamin (MULTIVITAMIN WITH MINERALS) TABS tablet Take 1 tablet by mouth daily.  . potassium chloride SA (K-DUR,KLOR-CON) 20 MEQ tablet Take 1 tablet (20 mEq total) by mouth 2 (two) times daily. (Patient taking differently: Take 20 mEq by mouth daily. )  . PROAIR HFA 108 (90 Base) MCG/ACT inhaler Inhale 1-2 puffs into the lungs every 6 (six) hours as needed for wheezing or shortness of breath.  [name]  tamsulosin (FLOMAX) 0.4 MG CAPS capsule Take 0.4 mg by mouth at bedtime.  . tolterodine (DETROL LA) 4 MG 24 hr capsule Take 4 mg by mouth daily.   [name] triamcinolone cream (KENALOG) 0.1 % Apply 1 application topically daily as needed.     No Known Allergies  Social History   Socioeconomic History  . Marital status: Married    Spouse name: Not on file  . Number of children: 2  . Years of education: post grad  . Highest education level: Not on file  Social Needs  . Financial  resource strain: Not on file  . Food insecurity - worry: Not on file  . Food insecurity - inability: Not on file  . Transportation needs - medical: Not on file  . Transportation needs - non-medical: Not on file  Occupational History  . 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 Smoking status: Former Smoker    Packs/day: 1.00    Years: 35.00    Pack years: 35.00    Types: Cigarettes    Last attempt to quit: 07/25/2005    Years since quitting: 11.7  . Smokeless tobacco: Never Used  Substance and Sexual Activity  . Alcohol use: Yes    Alcohol/week: 0.0 oz    Types: 1 Standard drinks or equivalent per week    Comment: rarely   . Drug use: No  . Sexual activity: Not on file  Other Topics Concern  . Not on file  Social History Narrative  . Not on file     Review of Systems: General: negative for chills, fever, night sweats or weight changes.  Cardiovascular: negative for chest pain, dyspnea on exertion, edema, orthopnea, palpitations, paroxysmal nocturnal dyspnea or shortness of breath Dermatological: negative for rash Respiratory: negative for cough or wheezing Urologic: negative for hematuria Abdominal: negative for nausea, vomiting, diarrhea, bright red blood per rectum, melena, or hematemesis Neurologic: negative for visual changes, syncope, or dizziness All other systems reviewed and are otherwise negative except as noted above.    Blood pressure (!) 174/76, pulse 63, height 6\' 1"  (1.854 m), weight (!) 326 lb (147.9 kg).  General appearance: alert and no distress Neck: no adenopathy, no JVD, supple, symmetrical, trachea midline, thyroid not enlarged, symmetric, no tenderness/mass/nodules and Bilateral carotid bruits versus transmitted murmur Lungs: clear to auscultation bilaterally Heart: 2/6 systolic ejection murmur at the base consistent with aortic stenosis Extremities: Severe bilateral lower extremity edema wrapped  bilaterally Pulses: 2+ and symmetric Skin: Skin color, texture, turgor normal. No rashes or lesions Neurologic: Alert and oriented X 3, normal strength and tone. Normal symmetric reflexes. Normal coordination and gait  EKG not performed today  ASSESSMENT AND PLAN:   Essential hypertension History of essential hypertension blood pressure measured 174/76. He is on carvedilol and lisinopril as well as diltiazem. He does admit to dietary indiscretion with regard to salt and I'm not sure that he is compliant with his medications either.  Obstructive sleep apnea History of obstructive sleep apnea on CPAP.  Acute on chronic diastolic (congestive) heart failure (HCC) History of chronic diastolic heart failure with recent admission 04/11/17 for volume overload. 2-D echo performed 11/13/16 revealed normal LV systolic function with grade 2 diastolic dysfunction. He was diuresed for extreme volume overload and does admit to dietary indiscretion with regard to salt.  Aortic stenosis, mild History of moderate aortic stenosis by 2-D echo performed 11/13/16. We will recheck a 2-D echo on an annual  basis. will recheck an echo on an annual basis.      Lorretta Harp MD FACP,FACC,FAHA, [facility] 04/20/2017 12:24 PM

## 2017-04-20 NOTE — Assessment & Plan Note (Signed)
History of chronic diastolic heart failure with recent admission 04/11/17 for volume overload. 2-D echo performed 11/13/16 revealed normal LV systolic function with grade 2 diastolic dysfunction. He was diuresed for extreme volume overload and does admit to dietary indiscretion with regard to salt.

## 2017-04-20 NOTE — Assessment & Plan Note (Signed)
History of essential hypertension blood pressure measured 174/76. He is on carvedilol and lisinopril as well as diltiazem. He does admit to dietary indiscretion with regard to salt and I'm not sure that he is compliant with his medications either.

## 2017-04-20 NOTE — Assessment & Plan Note (Signed)
History of obstructive sleep apnea on CPAP. 

## 2017-04-20 NOTE — Patient Instructions (Addendum)
Medication Instructions: Your physician recommends that you continue on your current medications as directed. Please refer to the Current Medication list given to you today.  Testing: Your physician has requested that you have an echocardiogram in June 2019. Echocardiography is a painless test that uses sound waves to create images of your heart. It provides your doctor with information about the size and shape of your heart and how well your heart's chambers and valves are working. This procedure takes approximately one hour. There are no restrictions for this procedure.  Follow-Up: We request that you follow-up in: 1 month with Kerin Ransom, PA and in 3 months with Dr Andria Rhein will receive a reminder letter in the mail two months in advance. If you don't receive a letter, please call our office to schedule the follow-up appointment.  If you need a refill on your cardiac medications before your next appointment, please call your pharmacy.

## 2017-04-27 ENCOUNTER — Other Ambulatory Visit (HOSPITAL_COMMUNITY): Payer: Medicare Other

## 2017-05-20 ENCOUNTER — Telehealth: Payer: Self-pay | Admitting: Cardiovascular Disease

## 2017-05-20 NOTE — Telephone Encounter (Signed)
Mr Sipriano was admitted with CHF at the end of November. Dr Gwenlyn Found wanted an updated echo after this. He should be scheduled for an OP echo -not urgent but in the next few weeks- and then he can see me in follow up afterwards. We'll reset his annual echo based on this ( he will not need an echo in June).   Kerin Ransom PA-C 05/20/2017 11:06 AM

## 2017-05-20 NOTE — Telephone Encounter (Signed)
Informed patient of advice/recommendations. Appt for tomorrow cancelled. Pt aware I have sent msg to scheduler pool to contact patient and arrange echo and f/u in next few weeks.

## 2017-05-20 NOTE — Telephone Encounter (Signed)
Patient calling states that he was supposed to have an EKG and would like to r/s. Patient would like to know if he needs to postpone  His appt tomorrow 05-21-17 with Rosalyn Gess

## 2017-05-20 NOTE — Telephone Encounter (Signed)
Spoke to patient. Reports he's doing well, no new concerns. He was seen on 12/4 by Dr. Gwenlyn Found and was scheduled for an echocardiogram on 12/10, had to cancel d/t inclement weather. He wanted to know if this was needed prior to his 1 month f/u w Lurena Joiner. Scheduled to see [facility] tomorrow. I see on his AVS where he was recommended to have an echo in June of 2019 and am not sure if the echo was prematurely scheduled in December, or if it was needed as well - no notes mention it. Pt does not recall when echo was scheduled either. He just wants to make sure it is needed prior to the f/u appt. Informed him I would route for advice and return his call.

## 2017-05-21 ENCOUNTER — Ambulatory Visit: Payer: Medicare Other | Admitting: Cardiology

## 2017-05-23 ENCOUNTER — Inpatient Hospital Stay (HOSPITAL_COMMUNITY)
Admission: EM | Admit: 2017-05-23 | Discharge: 2017-05-26 | DRG: 292 | Disposition: A | Discharge status: Home / Self Care | Payer: Medicare Other | Attending: Internal Medicine | Admitting: Internal Medicine

## 2017-05-23 ENCOUNTER — Telehealth: Payer: Self-pay | Admitting: Nurse Practitioner

## 2017-05-23 ENCOUNTER — Emergency Department (HOSPITAL_COMMUNITY): Payer: Medicare Other

## 2017-05-23 ENCOUNTER — Encounter (HOSPITAL_COMMUNITY): Payer: Self-pay | Admitting: Emergency Medicine

## 2017-05-23 ENCOUNTER — Other Ambulatory Visit: Payer: Self-pay

## 2017-05-23 DIAGNOSIS — Z79899 Other long term (current) drug therapy: Secondary | ICD-10-CM | POA: Diagnosis not present

## 2017-05-23 DIAGNOSIS — D649 Anemia, unspecified: Secondary | ICD-10-CM | POA: Diagnosis present

## 2017-05-23 DIAGNOSIS — E1151 Type 2 diabetes mellitus with diabetic peripheral angiopathy without gangrene: Secondary | ICD-10-CM | POA: Diagnosis present

## 2017-05-23 DIAGNOSIS — L97919 Non-pressure chronic ulcer of unspecified part of right lower leg with unspecified severity: Secondary | ICD-10-CM

## 2017-05-23 DIAGNOSIS — Z6841 Body Mass Index (BMI) 40.0 and over, adult: Secondary | ICD-10-CM

## 2017-05-23 DIAGNOSIS — R0600 Dyspnea, unspecified: Secondary | ICD-10-CM | POA: Diagnosis present

## 2017-05-23 DIAGNOSIS — G4733 Obstructive sleep apnea (adult) (pediatric): Secondary | ICD-10-CM | POA: Diagnosis present

## 2017-05-23 DIAGNOSIS — K219 Gastro-esophageal reflux disease without esophagitis: Secondary | ICD-10-CM | POA: Diagnosis present

## 2017-05-23 DIAGNOSIS — Z7982 Long term (current) use of aspirin: Secondary | ICD-10-CM | POA: Diagnosis not present

## 2017-05-23 DIAGNOSIS — Z9119 Patient's noncompliance with other medical treatment and regimen: Secondary | ICD-10-CM | POA: Diagnosis not present

## 2017-05-23 DIAGNOSIS — I350 Nonrheumatic aortic (valve) stenosis: Secondary | ICD-10-CM | POA: Diagnosis present

## 2017-05-23 DIAGNOSIS — I160 Hypertensive urgency: Secondary | ICD-10-CM | POA: Diagnosis present

## 2017-05-23 DIAGNOSIS — I5033 Acute on chronic diastolic (congestive) heart failure: Secondary | ICD-10-CM | POA: Diagnosis present

## 2017-05-23 DIAGNOSIS — Z96652 Presence of left artificial knee joint: Secondary | ICD-10-CM | POA: Diagnosis present

## 2017-05-23 DIAGNOSIS — I509 Heart failure, unspecified: Secondary | ICD-10-CM

## 2017-05-23 DIAGNOSIS — Z87891 Personal history of nicotine dependence: Secondary | ICD-10-CM

## 2017-05-23 DIAGNOSIS — L97929 Non-pressure chronic ulcer of unspecified part of left lower leg with unspecified severity: Secondary | ICD-10-CM

## 2017-05-23 DIAGNOSIS — I110 Hypertensive heart disease with heart failure: Principal | ICD-10-CM | POA: Diagnosis present

## 2017-05-23 DIAGNOSIS — Z9049 Acquired absence of other specified parts of digestive tract: Secondary | ICD-10-CM

## 2017-05-23 DIAGNOSIS — L97909 Non-pressure chronic ulcer of unspecified part of unspecified lower leg with unspecified severity: Secondary | ICD-10-CM | POA: Diagnosis present

## 2017-05-23 DIAGNOSIS — I169 Hypertensive crisis, unspecified: Secondary | ICD-10-CM | POA: Diagnosis not present

## 2017-05-23 DIAGNOSIS — Z9111 Patient's noncompliance with dietary regimen: Secondary | ICD-10-CM

## 2017-05-23 DIAGNOSIS — E785 Hyperlipidemia, unspecified: Secondary | ICD-10-CM | POA: Diagnosis present

## 2017-05-23 DIAGNOSIS — I83019 Varicose veins of right lower extremity with ulcer of unspecified site: Secondary | ICD-10-CM | POA: Diagnosis present

## 2017-05-23 DIAGNOSIS — I872 Venous insufficiency (chronic) (peripheral): Secondary | ICD-10-CM | POA: Diagnosis present

## 2017-05-23 DIAGNOSIS — I480 Paroxysmal atrial fibrillation: Secondary | ICD-10-CM | POA: Diagnosis present

## 2017-05-23 DIAGNOSIS — I878 Other specified disorders of veins: Secondary | ICD-10-CM | POA: Diagnosis present

## 2017-05-23 DIAGNOSIS — I83029 Varicose veins of left lower extremity with ulcer of unspecified site: Secondary | ICD-10-CM

## 2017-05-23 DIAGNOSIS — I361 Nonrheumatic tricuspid (valve) insufficiency: Secondary | ICD-10-CM | POA: Diagnosis not present

## 2017-05-23 DIAGNOSIS — Z7984 Long term (current) use of oral hypoglycemic drugs: Secondary | ICD-10-CM | POA: Diagnosis not present

## 2017-05-23 DIAGNOSIS — I83009 Varicose veins of unspecified lower extremity with ulcer of unspecified site: Secondary | ICD-10-CM | POA: Diagnosis present

## 2017-05-23 HISTORY — DX: Nonrheumatic aortic (valve) stenosis: I35.0

## 2017-05-23 HISTORY — DX: Personal history of other medical treatment: Z92.89

## 2017-05-23 HISTORY — DX: Iron deficiency anemia, unspecified: D50.9

## 2017-05-23 LAB — CBC WITH DIFFERENTIAL/PLATELET
BASOS PCT: 0 %
Basophils Absolute: 0 10*3/uL (ref 0.0–0.1)
Eosinophils Absolute: 0.2 10*3/uL (ref 0.0–0.7)
Eosinophils Relative: 2 %
HCT: 35.1 % — ABNORMAL LOW (ref 39.0–52.0)
Hemoglobin: 10.5 g/dL — ABNORMAL LOW (ref 13.0–17.0)
Lymphocytes Relative: 10 %
Lymphs Abs: 1.2 10*3/uL (ref 0.7–4.0)
MCH: 28.3 pg (ref 26.0–34.0)
MCHC: 29.9 g/dL — AB (ref 30.0–36.0)
MCV: 94.6 fL (ref 78.0–100.0)
MONOS PCT: 7 %
Monocytes Absolute: 0.7 10*3/uL (ref 0.1–1.0)
Neutro Abs: 9 10*3/uL — ABNORMAL HIGH (ref 1.7–7.7)
Neutrophils Relative %: 81 %
Platelets: 370 10*3/uL (ref 150–400)
RBC: 3.71 MIL/uL — ABNORMAL LOW (ref 4.22–5.81)
RDW: 15.9 % — ABNORMAL HIGH (ref 11.5–15.5)
WBC: 11.2 10*3/uL — ABNORMAL HIGH (ref 4.0–10.5)

## 2017-05-23 LAB — COMPREHENSIVE METABOLIC PANEL
ALT: 26 U/L (ref 17–63)
AST: 20 U/L (ref 15–41)
Albumin: 2.8 g/dL — ABNORMAL LOW (ref 3.5–5.0)
Alkaline Phosphatase: 79 U/L (ref 38–126)
Anion gap: 6 (ref 5–15)
BUN: 16 mg/dL (ref 6–20)
CHLORIDE: 104 mmol/L (ref 101–111)
CO2: 28 mmol/L (ref 22–32)
CREATININE: 1.14 mg/dL (ref 0.61–1.24)
Calcium: 8.7 mg/dL — ABNORMAL LOW (ref 8.9–10.3)
GFR calc non Af Amer: >60 mL/min (ref >60)
Glucose, Bld: 112 mg/dL — ABNORMAL HIGH (ref 65–99)
Potassium: 3.9 mmol/L (ref 3.5–5.1)
SODIUM: 138 mmol/L (ref 135–145)
Total Bilirubin: 0.8 mg/dL (ref 0.3–1.2)
Total Protein: 6.6 g/dL (ref 6.5–8.1)

## 2017-05-23 LAB — I-STAT TROPONIN, ED: Troponin i, poc: 0.03 ng/mL (ref 0.00–0.08)

## 2017-05-23 LAB — GLUCOSE, CAPILLARY: GLUCOSE-CAPILLARY: 184 mg/dL — AB (ref 65–99)

## 2017-05-23 LAB — MRSA PCR SCREENING: MRSA by PCR: POSITIVE — AB

## 2017-05-23 LAB — BRAIN NATRIURETIC PEPTIDE: B Natriuretic Peptide: 488.7 pg/mL — ABNORMAL HIGH (ref 0.0–100.0)

## 2017-05-23 MED ORDER — FESOTERODINE FUMARATE ER 4 MG PO TB24
4.0000 mg | ORAL_TABLET | Freq: Every day | ORAL | Status: DC
  Administered 2017-05-23 – 2017-05-26 (×4): 4 mg via ORAL
  Filled 2017-05-23 (×4): qty 1

## 2017-05-23 MED ORDER — FOLIC ACID 1 MG PO TABS
1.0000 mg | ORAL_TABLET | Freq: Every day | ORAL | Status: DC
  Administered 2017-05-23 – 2017-05-26 (×4): 1 mg via ORAL
  Filled 2017-05-23 (×4): qty 1

## 2017-05-23 MED ORDER — TAMSULOSIN HCL 0.4 MG PO CAPS
0.4000 mg | ORAL_CAPSULE | Freq: Every day | ORAL | Status: DC
  Administered 2017-05-23 – 2017-05-25 (×3): 0.4 mg via ORAL
  Filled 2017-05-23 (×3): qty 1

## 2017-05-23 MED ORDER — DIAZEPAM 5 MG PO TABS
5.0000 mg | ORAL_TABLET | Freq: Every day | ORAL | Status: DC
  Administered 2017-05-23 – 2017-05-26 (×4): 5 mg via ORAL
  Filled 2017-05-23 (×4): qty 1

## 2017-05-23 MED ORDER — SPIRONOLACTONE 25 MG PO TABS
25.0000 mg | ORAL_TABLET | Freq: Every day | ORAL | Status: DC
  Administered 2017-05-23 – 2017-05-26 (×4): 25 mg via ORAL
  Filled 2017-05-23 (×4): qty 1

## 2017-05-23 MED ORDER — HYDRALAZINE HCL 20 MG/ML IJ SOLN
10.0000 mg | INTRAMUSCULAR | Status: DC | PRN
  Administered 2017-05-24 – 2017-05-26 (×3): 10 mg via INTRAVENOUS
  Filled 2017-05-23 (×3): qty 1

## 2017-05-23 MED ORDER — ALBUTEROL SULFATE (2.5 MG/3ML) 0.083% IN NEBU: 2.5000 mg | INHALATION_SOLUTION | Freq: Four times a day (QID) | RESPIRATORY_TRACT | Status: DC | PRN

## 2017-05-23 MED ORDER — GLIMEPIRIDE 4 MG PO TABS
4.0000 mg | ORAL_TABLET | Freq: Every day | ORAL | Status: DC
  Administered 2017-05-23 – 2017-05-26 (×4): 4 mg via ORAL
  Filled 2017-05-23 (×4): qty 1

## 2017-05-23 MED ORDER — SODIUM CHLORIDE 0.9 % IV SOLN: 250.0000 mL | INTRAVENOUS | Status: DC | PRN

## 2017-05-23 MED ORDER — DICLOFENAC SODIUM 75 MG PO TBEC
75.0000 mg | DELAYED_RELEASE_TABLET | Freq: Two times a day (BID) | ORAL | Status: DC
  Administered 2017-05-23 – 2017-05-26 (×6): 75 mg via ORAL
  Filled 2017-05-23 (×7): qty 1

## 2017-05-23 MED ORDER — ONDANSETRON HCL 4 MG/2ML IJ SOLN: 4.0000 mg | Freq: Four times a day (QID) | INTRAMUSCULAR | Status: DC | PRN

## 2017-05-23 MED ORDER — ASPIRIN EC 81 MG PO TBEC: 81.0000 mg | DELAYED_RELEASE_TABLET | Freq: Every day | ORAL | Status: DC

## 2017-05-23 MED ORDER — FUROSEMIDE 10 MG/ML IJ SOLN
60.0000 mg | Freq: Once | INTRAMUSCULAR | Status: AC
  Administered 2017-05-23: 60 mg via INTRAVENOUS
  Filled 2017-05-23: qty 6

## 2017-05-23 MED ORDER — ASPIRIN 325 MG PO TABS
325.0000 mg | ORAL_TABLET | Freq: Every day | ORAL | Status: DC
Start: 2017-05-23 — End: 2017-05-23
  Administered 2017-05-23: 325 mg via ORAL
  Filled 2017-05-23: qty 1

## 2017-05-23 MED ORDER — CARVEDILOL 12.5 MG PO TABS
12.5000 mg | ORAL_TABLET | Freq: Two times a day (BID) | ORAL | Status: DC
Start: 2017-05-23 — End: 2017-05-26
  Administered 2017-05-23 – 2017-05-25 (×6): 12.5 mg via ORAL
  Filled 2017-05-23 (×7): qty 1

## 2017-05-23 MED ORDER — ACETAMINOPHEN 325 MG PO TABS
650.0000 mg | ORAL_TABLET | ORAL | Status: DC | PRN
  Administered 2017-05-25: 650 mg via ORAL
  Filled 2017-05-23: qty 2

## 2017-05-23 MED ORDER — SODIUM CHLORIDE 0.9% FLUSH: 3.0000 mL | INTRAVENOUS | Status: DC | PRN

## 2017-05-23 MED ORDER — DILTIAZEM HCL ER COATED BEADS 180 MG PO CP24
360.0000 mg | ORAL_CAPSULE | Freq: Every day | ORAL | Status: DC
  Administered 2017-05-23 – 2017-05-26 (×4): 360 mg via ORAL
  Filled 2017-05-23 (×2): qty 2
  Filled 2017-05-23: qty 1
  Filled 2017-05-23: qty 2

## 2017-05-23 MED ORDER — INSULIN DETEMIR 100 UNIT/ML ~~LOC~~ SOLN
20.0000 [IU] | Freq: Every day | SUBCUTANEOUS | Status: DC
  Administered 2017-05-23: 20 [IU] via SUBCUTANEOUS
  Filled 2017-05-23 (×2): qty 0.2

## 2017-05-23 MED ORDER — ASPIRIN EC 81 MG PO TBEC
81.0000 mg | DELAYED_RELEASE_TABLET | Freq: Every day | ORAL | Status: DC
  Administered 2017-05-24 – 2017-05-26 (×3): 81 mg via ORAL
  Filled 2017-05-23 (×3): qty 1

## 2017-05-23 MED ORDER — FUROSEMIDE 10 MG/ML IJ SOLN
80.0000 mg | Freq: Two times a day (BID) | INTRAMUSCULAR | Status: DC
  Administered 2017-05-23 – 2017-05-25 (×5): 80 mg via INTRAVENOUS
  Filled 2017-05-23 (×5): qty 8

## 2017-05-23 MED ORDER — ATORVASTATIN CALCIUM 40 MG PO TABS
40.0000 mg | ORAL_TABLET | Freq: Every evening | ORAL | Status: DC
  Administered 2017-05-23 – 2017-05-25 (×3): 40 mg via ORAL
  Filled 2017-05-23 (×3): qty 1

## 2017-05-23 MED ORDER — INSULIN ASPART 100 UNIT/ML ~~LOC~~ SOLN
0.0000 [IU] | Freq: Three times a day (TID) | SUBCUTANEOUS | Status: DC
  Administered 2017-05-25: 2 [IU] via SUBCUTANEOUS

## 2017-05-23 MED ORDER — POTASSIUM CHLORIDE CRYS ER 20 MEQ PO TBCR
20.0000 meq | EXTENDED_RELEASE_TABLET | Freq: Every day | ORAL | Status: DC
  Administered 2017-05-23: 20 meq via ORAL
  Filled 2017-05-23: qty 1

## 2017-05-23 MED ORDER — LISINOPRIL 40 MG PO TABS
40.0000 mg | ORAL_TABLET | Freq: Every day | ORAL | Status: DC
  Administered 2017-05-23 – 2017-05-26 (×4): 40 mg via ORAL
  Filled 2017-05-23 (×3): qty 1
  Filled 2017-05-23: qty 2

## 2017-05-23 MED ORDER — GLIMEPIRIDE 4 MG PO TABS
4.0000 mg | ORAL_TABLET | Freq: Every day | ORAL | Status: DC
Start: 2017-05-24 — End: 2017-05-23

## 2017-05-23 MED ORDER — SODIUM CHLORIDE 0.9% FLUSH
3.0000 mL | Freq: Two times a day (BID) | INTRAVENOUS | Status: DC
  Administered 2017-05-23 – 2017-05-25 (×5): 3 mL via INTRAVENOUS

## 2017-05-23 MED ORDER — HEPARIN SODIUM (PORCINE) 5000 UNIT/ML IJ SOLN
5000.0000 [IU] | Freq: Three times a day (TID) | INTRAMUSCULAR | Status: DC
  Administered 2017-05-23 – 2017-05-26 (×8): 5000 [IU] via SUBCUTANEOUS
  Filled 2017-05-23 (×8): qty 1

## 2017-05-23 NOTE — ED Triage Notes (Addendum)
Pt to ED -- [place], with c/o shortness of breath-- pt has had hx of CHF and recently was hospitalized with same and increased lasix to 40mg  BID.  Pt is alert/oriented x 4 - O2 sats were 90% on room air at church, increased to 98-100% on 4L/m/[place].  Pt is also being treated at [facility] for weeping edema in lower legs.

## 2017-05-23 NOTE — Telephone Encounter (Signed)
   Received call from EMT who [place]ts [place].  Pt had not yet taken bp meds this AM and he was dyspneic upon walking into church.  O2 sat 90 on RA, now 98 on 4L. BP 937 systolic.  EMS team rec tx to [facility] ED for eval and I agreed, however pt refuses transport and prefers to go home and take AM meds and see how he feels.  I reiterated that I felt pt would likely require IV medication to manage BP and potentially IV lasix for dyspnea. He refuses transport.  I will send message to office requesting appt this week.  Pt knows to call EMS or present to ED for ongoing Ss.   Murray Hodgkins, NP 05/23/2017, 8:41 AM

## 2017-05-23 NOTE — ED Notes (Signed)
MD Bensimone at bedside

## 2017-05-23 NOTE — ED Notes (Signed)
Heart healthy carb mod tray ordered

## 2017-05-23 NOTE — ED Provider Notes (Signed)
[facility]PART[facility] Provider Note   CSN: [id number] Arrival date & time: 05/23/17  0908     History   Chief Complaint Chief Complaint  Patient presents with  . Shortness of Breath    HPI Mario Mills is a 67 y.o. male.  Patient is a 67 year old male with a history of diastolic CHF, diabetes, chronic venous stasis, chronic anticoagulation on Eliquis presenting today with paramedics for shortness of breath.  Patient was recently hospitalized the beginning of December for CHF exacerbation.  At that time he was found to be fluid overloaded and received IV Lasix.  Patient did not receive an echo while hospitalized but symptoms were thought to result from patient not being compliant with a low-salt diet, not limiting his fluid intake.  Patient was diuresed and went home.  Family states over the last 2 weeks he has had slightly worsening shortness of breath.  Patient feels the swelling in his legs is about the same.  He states today he was rushing around to get to church and was short of breath during that time.  When he got to church walking into church he felt more short of breath and they called 911.  Patient denies any chest pain.  He has had a cough but no sputum production and no fever or congestion.  He denies any abdominal pain nausea or vomiting.  Patient has not taken his morning medications but states otherwise he has been taking them as prescribed.  He is still taking 40 mg of Lasix twice daily.  Based on recent phone notes they are trying to arrange an outpatient echo for the next few weeks but last echo was done in June of this year which showed moderate aortic stenosis with preserved EF of 55-60%.   The history is provided by the patient.    Past Medical History:  Diagnosis Date  . Acute blood loss anemia   . Arthritis   . Chronic anticoagulation    Eliquis  . Chronic diastolic heart failure ([place])   . Chronic low back pain with sciatica   .  Chronic low back pain with sciatica   . Constipation   . DM (diabetes mellitus) type II controlled peripheral vascular disorder   . DOE (dyspnea on exertion)    WITH MINIMAL EXERTION  . Dysrhythmia   . Falls   . GERD (gastroesophageal reflux disease)   . Heart murmur   . History of Doppler ultrasound 12/2012   LE VENOUS REFLUX demostrated on L (vein stripping on R w/MRSA); h/o venous stasis bilat   . History of urinary tract infection   . Hyperlipidemia   . Hypertension   . Hypokalemia   . Numbness and tingling    feet bilat   . Obstructive sleep apnea    CPAP  . Paroxysmal atrial fibrillation ([facility])   . Peripheral arterial disease ([place])   . Primary osteoarthritis of left knee   . Urinary incontinence   . Venous insufficiency   . Venous stasis ulcer ([place])     Patient Active Problem List   Diagnosis Date Noted  . Acute exacerbation of CHF (congestive heart failure) ([place]) 04/11/2017  . Non-compliance 04/11/2017  . AKI (acute kidney injury) ([place]) 04/11/2017  . Aortic stenosis, mild 06/01/2016  . Chronic diastolic CHF (congestive heart failure), NYHA class 2 ([place]) 06/26/2015  . Paroxysmal atrial fibrillation (H[facility])   . Chronic anticoagulation   . Acute on chronic diastolic (congestive) heart failure ([place])   .  Aspiration pneumonitis ([employer]) 06/06/2015  . Pulmonary vascular congestion 06/02/2015  . Postoperative anemia due to acute blood loss 06/02/2015  . Hypertensive urgency 05/31/2015  . Acute respiratory failure with hypoxia ([place]) 05/31/2015  . S/P left knee arthroscopy 05/31/2015  . DM (diabetes mellitus) type II, controlled, with peripheral vascular disorder ([place]) 05/31/2015  . Morbid obesity due to excess calories ([place]) 05/31/2015  . Bradycardia with 41 - 50 beats per minute 05/31/2015  . Sepsis, unspecified organism ([place]) 05/31/2015  . Venous stasis ulcers of both lower extremities ([place]) 03/28/2014  . Essential hypertension 12/22/2012  . Obstructive sleep apnea  12/22/2012    Past Surgical History:  Procedure Laterality Date  . COLON RESECTION  2007  . COLON SURGERY    . TONSILLECTOMY    . TOTAL KNEE ARTHROPLASTY Left 05/31/2015   Procedure: TOTAL LEFT  KNEE ARTHROPLASTY;  Surgeon: Gaynelle Arabian, MD;  Location: [facility];  Service: Orthopedics;  Laterality: Left;  . TRANSTHORACIC ECHOCARDIOGRAM  07/02/2005   mod concentric LVH; LV borderline dilated; LA mild-mod dilated; mild MR & TR  . VARICOSE VEIN SURGERY     R leg       Home Medications    Prior to Admission medications   Medication Sig Start Date End Date Taking? Authorizing Provider  aspirin 325 MG tablet Take 1 tablet (325 mg total) by mouth daily. 04/15/17   Georgette Shell, MD  atorvastatin (LIPITOR) 40 MG tablet Take 40 mg by mouth every evening. 04/03/17   [provider]  carvedilol (COREG) 12.5 MG tablet Take 12.5 mg by mouth 2 (two) times daily with a meal.    [provider]  diazepam (VALIUM) 5 MG tablet Take 5 mg by mouth daily.  04/24/15   [provider]  diltiazem (CARDIZEM CD) 360 MG 24 hr capsule Take 1 capsule (360 mg total) by mouth daily. 06/11/15   Perkins, Alexzandrew L, PA-C  folic acid (FOLVITE) 1 MG tablet Take 1 mg by mouth daily. 02/12/17   [provider]  furosemide (LASIX) 40 MG tablet Take 1 tablet (40 mg total) by mouth 2 (two) times daily. 04/14/17 05/14/17  Georgette Shell, MD  glimepiride (AMARYL) 4 MG tablet Take 4 mg by mouth daily before breakfast. Reported on 09/24/2015    [provider]  hydrOXYzine (ATARAX/VISTARIL) 25 MG tablet Take 25 mg by mouth 2 (two) times daily as needed for itching. 02/07/17   [provider]  LEVEMIR FLEXTOUCH 100 UNIT/ML Pen Inject 20 Units into the skin at bedtime. 05/10/15   [provider]  lisinopril (PRINIVIL,ZESTRIL) 40 MG tablet Take 40 mg by mouth daily. 04/02/17   [provider]  methotrexate (RHEUMATREX) 2.5 MG tablet Take 10 mg by mouth  once a week. 04/04/17   [provider]  Multiple Vitamin (MULTIVITAMIN WITH MINERALS) TABS tablet Take 1 tablet by mouth daily.    [provider]  potassium chloride SA (K-DUR,KLOR-CON) 20 MEQ tablet Take 1 tablet (20 mEq total) by mouth 2 (two) times daily. Patient taking differently: Take 20 mEq by mouth daily.  04/14/17 05/14/17  Georgette Shell, MD  PROAIR HFA 108 [phone] Base) MCG/ACT inhaler Inhale 1-2 puffs into the lungs every 6 (six) hours as needed for wheezing or shortness of breath. 04/02/17   [provider]  tamsulosin (FLOMAX) 0.4 MG CAPS capsule Take 0.4 mg by mouth at bedtime. 04/03/17   [provider]  tolterodine (DETROL LA) 4 MG 24 hr capsule Take 4  mg by mouth daily.  01/16/16   [provider]  triamcinolone cream (KENALOG) 0.1 % Apply 1 application topically daily as needed. 05/01/15   [provider]    Family History Family History  Problem Relation Age of Onset  . Pancreatic cancer Father   . Cancer Father   . Heart Problems Mother   . Hypertension Mother   . Diabetes Brother   . Hypertension Brother   . Colon cancer Neg Hx     Social History Social History   Tobacco Use  . Smoking status: Former Smoker    Packs/day: 1.00    Years: 35.00    Pack years: 35.00    Types: Cigarettes    Last attempt to quit: 07/25/2005    Years since quitting: 11.8  . Smokeless tobacco: Never Used  Substance Use Topics  . Alcohol use: Yes    Alcohol/week: 0.0 oz    Types: 1 Standard drinks or equivalent per week    Comment: rarely   . Drug use: No     Allergies   Patient has no known allergies.   Review of Systems Review of Systems  All other systems reviewed and are negative.    Physical Exam Updated Vital Signs BP (!) 172/64 (BP Location: Left Arm)   Pulse (!) 55   Temp 97.7 F (36.5 C) (Oral)   Resp (!) 22   Wt (!) 147.9 kg (326 lb)   SpO2 100%   BMI 43.01 kg/m   Physical Exam    Constitutional: He is oriented to person, place, and time. He appears well-developed and well-nourished. No distress.  Obese male in no acute distress able to speak in complete sentences.  HENT:  Head: Normocephalic and atraumatic.  Mouth/Throat: Oropharynx is clear and moist.  Eyes: Conjunctivae and EOM are normal. Pupils are equal, round, and reactive to light.  Neck: Normal range of motion. Neck supple.  Cardiovascular: Normal rate, regular rhythm and intact distal pulses.  Murmur heard. Pulmonary/Chest: Effort normal. No respiratory distress. He has no wheezes. He has rales in the right lower field and the left lower field.  Abdominal: Soft. He exhibits no distension. There is no tenderness. There is no rebound and no guarding.  Musculoskeletal: Normal range of motion. He exhibits no tenderness.       Right lower leg: He exhibits edema.       Left lower leg: He exhibits edema.  Legs are wrapped in [place] boots.  Mild edema noted in bilateral feet.  Capillary refill less than 3 seconds.  Neurological: He is alert and oriented to person, place, and time.  Skin: Skin is warm and dry. No rash noted. No erythema.  Psychiatric: He has a normal mood and affect. His behavior is normal.  Nursing note and vitals reviewed.    ED Treatments / Results  Labs (all labs ordered are listed, but only abnormal results are displayed) Labs Reviewed  CBC WITH DIFFERENTIAL/PLATELET - Abnormal; Notable for the following components:      Result Value   WBC 11.2 (*)    RBC 3.71 (*)    Hemoglobin 10.5 (*)    HCT 35.1 (*)    MCHC 29.9 (*)    RDW 15.9 (*)    Neutro Abs 9.0 (*)    All other components within normal limits  COMPREHENSIVE METABOLIC PANEL - Abnormal; Notable for the following components:   Glucose, Bld 112 (*)    Calcium 8.7 (*)    Albumin 2.8 (*)  All other components within normal limits  BRAIN NATRIURETIC PEPTIDE - Abnormal; Notable for the following components:   B Natriuretic  Peptide 488.7 (*)    All other components within normal limits  MRSA PCR SCREENING  BASIC METABOLIC PANEL  I-STAT TROPONIN, ED    EKG  EKG Interpretation  Date/Time:  Sunday May 23 2017 09:36:00 EST Ventricular Rate:  67 PR Interval:    QRS Duration: 95 QT Interval:  453 QTC Calculation: 479 R Axis:   35 Text Interpretation:  Sinus rhythm Consider anterior infarct Nonspecific T abnormalities, lateral leads Baseline wander in lead(s) II III aVF V2 V3 No significant change since last tracing Confirmed by Blanchie Dessert [phone]) on 05/23/2017 9:42:18 AM       Radiology Dg Chest 2 View  Result Date: 05/23/2017 CLINICAL DATA:  Acute shortness of breath today. EXAM: CHEST  2 VIEW COMPARISON:  04/11/2017 and prior chest radiographs FINDINGS: Cardiomegaly with pulmonary vascular congestion and interstitial edema noted. There is no evidence of pleural effusion or pneumothorax. No acute bony abnormality. IMPRESSION: Cardiomegaly with interstitial pulmonary edema. Electronically Signed   By: Margarette Canada M.D.   On: 05/23/2017 11:04    Procedures Procedures (including critical care time)  Medications Ordered in ED Medications - No data to display   Initial Impression / Assessment and Plan / ED Course  I have reviewed the triage vital signs and the nursing notes.  Pertinent labs & imaging results that were available during my care of the patient were reviewed by me and considered in my medical decision making (see chart for details).     Patient is an elderly male with multiple medical problems presenting today with concerns for CHF exacerbation.  Patient is currently in no acute distress.  Initially he required 4 L of oxygen for hypoxia of 90% on the scene however patient is maintaining his sats without oxygen currently.  Patient denies any infectious symptoms concerning for pneumonia, symptoms not suggestive of ACS, PE.  Patient is otherwise well-appearing.  Patient recently admitted  for CHF exacerbation.  Last echo was done in June and currently attempts were being made to get an echo within the next few weeks.  Patient is not compliant with diet or fluid restriction.  He has not taken his morning medications today.  Patient initially was refusing to come to the hospital but was encouraged by cardiology to be evaluated.  CBC, CMP, BNP, troponin, chest x-ray pending.  1:06 PM Labs and imaging consistent with CHF exacerbation.  Patient given IV Lasix.  However he still requiring some oxygen to maintain saturations and not feel short of breath.  He was given home doses of his blood pressure medications.  Will discuss with cardiology.  Final Clinical Impressions(s) / ED Diagnoses   Final diagnoses:  Acute on chronic congestive heart failure, unspecified heart failure type [facility])    ED Discharge Orders    None       Blanchie Dessert, MD 05/23/17 1949

## 2017-05-23 NOTE — ED Notes (Signed)
Re-paged Dr. Harl Bowie to [phone]

## 2017-05-23 NOTE — Progress Notes (Signed)
Patient states he wants leg wraps removed due to pain, RN will help cut them off. Patient states he get legs wrapped about every week at a lymphedema clinic in [place], [place].

## 2017-05-23 NOTE — Progress Notes (Signed)
Patient requests someone to ask about vaccine history to see if he's had the pneumonia vaccine. Patient states that when he calls, nothing gets done fast enough.

## 2017-05-23 NOTE — H&P (Signed)
History & Physical    Patient ID: Mario Mills MRN: [id number], DOB/AGE: Jan 27, 1951   Admit date: 05/23/2017   Primary Physician: Nolene Ebbs, MD Primary Cardiologist: Quay Burow, MD  Patient Profile    67 year old male with a prior history of HFpEF, hypertension, hyperlipidemia, obesity, diabetes, GERD, chronic venous stasis, moderate aortic stenosis, and reported history of paroxysmal atrial fibrillation, who presents to the emergency department with a several week history of progressive dyspnea and hypertensive urgency.  Past Medical History    Past Medical History:  Diagnosis Date  . Arthritis   . Chronic diastolic heart failure ([place])    a. 10/2016 Echo: EF 55-60%, gr2 DD, triv AI, mod dil LA, mildly dil RV/RA.  [name] Chronic low back pain with sciatica   . Constipation   . DM (diabetes mellitus) type II controlled peripheral vascular disorder   . GERD (gastroesophageal reflux disease)   . History of stress test    a. 04/2015 MV: EF 47%, mild basal inflat and mid inflat defect c/w ischemia. EF nl by echo @ that time and no further isch w/u pursued.  . Hyperlipidemia   . Hypertension   . Hypokalemia   . Microcytic anemia   . Moderate aortic stenosis    a. 10/2016 Echo: Mod AS.  . Numbness and tingling    feet bilat   . Obstructive sleep apnea    a. compliant w/ CPAP.  [name] Peripheral arterial disease ([place])   . Primary osteoarthritis of left knee   . Urinary incontinence   . Venous insufficiency    a. chronic LEE-->wound mgmt/wraps  . Venous stasis ulcer ([place])     Past Surgical History:  Procedure Laterality Date  . COLON RESECTION  2007  . COLON SURGERY    . TONSILLECTOMY    . TOTAL KNEE ARTHROPLASTY Left 05/31/2015   Procedure: TOTAL LEFT  KNEE ARTHROPLASTY;  Surgeon: Gaynelle Arabian, MD;  Location: [facility];  Service: Orthopedics;  Laterality: Left;  . TRANSTHORACIC ECHOCARDIOGRAM  07/02/2005   mod concentric LVH; LV borderline dilated; LA mild-mod dilated; mild  MR & TR  . VARICOSE VEIN SURGERY     R leg     Allergies  No Known Allergies  History of Present Illness    67 year old male with the above complex past medical history including chronic diastolic congestive heart failure with an EF of 55-6% by echocardiogram in June 2018, moderate aortic stenosis, obesity, diabetes, GERD, hypertension, hyperlipidemia, microcytic anemia, sleep apnea on CPAP, and reported history of paroxysmal atrial fibrillation by past medical history though I cannot find any notes documenting this.  He is followed by Dr. Gwenlyn Found.  He prev underwent stress testing in 04/2015, which showed a small area of Inflat ischemia and reduced LV fxn, though echo @ the same time showed nl LV fxn.  He has never required diagnostic cath.  He was admitted to [facility] in November in the setting of progressive dyspnea and volume overload despite outpatient adjustment of diuretics.  He was d/c'd home @ a wt of 312 lbs.  At f/u with Dr. Gwenlyn Found in late Nov, he was doing well.  He says that over the past 3 wks or so, he has noted progressive dyspnea on exertion and also increasing lower extremity edema.  He does have chronic lower extremity swelling in his legs are typically wrapped however he has been having swelling in his thighs and buttocks.  He does not weigh himself at home.  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  He frequently goes to fast food restaurants for his own meals though he says he never adds salt.  Over the past week, dyspnea has progressed further and this morning, he was walking into church and felt very short of breath.  As result, he called EMS.  Upon their arrival, pressures were in the 190s and he was saturating at a proximally 90% on room air.  He was placed on 4 L of oxygen.  I was actually called by the EMS staff as he was  refusing to come to the hospital.  I did recommend that he come and sometime after we hung up, he allowed the EMS staff to bring him to [facility].  Here, his blood pressure was recorded at 201/83 and subsequently to 216/76.  ECG shows sinus rhythm with nonspecific changes.  Chest x-ray shows interstitial edema.  He has been treated with IV Lasix and is responding well currently.  He is currently stable at rest.  He denies any recent history of chest pain.  Further, he denies PND, orthopnea, dizziness, syncope, or early satiety.  Home Medications    Prior to Admission medications   Medication Sig Start Date End Date Taking? Authorizing Provider  aspirin 325 MG tablet Take 1 tablet (325 mg total) by mouth daily. 04/15/17  Yes Georgette Shell, MD  atorvastatin (LIPITOR) 40 MG tablet Take 40 mg by mouth every evening. 04/03/17  Yes [provider]  carvedilol (COREG) 12.5 MG tablet Take 12.5 mg by mouth 2 (two) times daily with a meal.   Yes [provider]  diazepam (VALIUM) 5 MG tablet Take 5 mg by mouth daily.  04/24/15  Yes [provider]  diclofenac (VOLTAREN) 75 MG EC tablet Take 75 mg by mouth 2 (two) times daily.   Yes [provider]  diltiazem (CARDIZEM CD) 360 MG 24 hr capsule Take 1 capsule (360 mg total) by mouth daily. 06/11/15  Yes Perkins, Alexzandrew L, PA-C  folic acid (FOLVITE) 1 MG tablet Take 1 mg by mouth daily. 02/12/17  Yes [provider]  furosemide (LASIX) 40 MG tablet Take 1 tablet (40 mg total) by mouth 2 (two) times daily. 04/14/17 05/23/17 Yes Georgette Shell, MD  glimepiride (AMARYL) 4 MG tablet Take 4 mg by mouth daily before breakfast. Reported on 09/24/2015   Yes [provider]  hydrOXYzine (ATARAX/VISTARIL) 25 MG tablet Take 25 mg by mouth 2 (two) times daily as needed for itching. 02/07/17  Yes [provider]  LEVEMIR FLEXTOUCH 100 UNIT/ML Pen Inject 20 Units into the skin at bedtime. 05/10/15  Yes  [provider]  lisinopril (PRINIVIL,ZESTRIL) 40 MG tablet Take 40 mg by mouth daily. 04/02/17  Yes [provider]  methotrexate (RHEUMATREX) 2.5 MG tablet Take 10 mg by mouth once a week. 04/04/17  Yes [provider]  Multiple Vitamin (MULTIVITAMIN WITH MINERALS) TABS tablet Take 1 tablet by mouth daily.   Yes [provider]  potassium chloride SA (K-DUR,KLOR-CON) 20 MEQ tablet Take 1 tablet (20 mEq total) by mouth 2 (two) times daily. Patient taking differently: Take 20 mEq by mouth daily.  04/14/17 05/23/17 Yes Georgette Shell, MD  PROAIR HFA [id number] Base) MCG/ACT inhaler Inhale 1-2 puffs into the lungs every 6 (six) hours as needed for wheezing  or shortness of breath. 04/02/17  Yes [provider]  tamsulosin (FLOMAX) 0.4 MG CAPS capsule Take 0.4 mg by mouth at bedtime. 04/03/17  Yes [provider]  tolterodine (DETROL LA) 4 MG 24 hr capsule Take 4 mg by mouth daily.  01/16/16  Yes [provider]  triamcinolone cream (KENALOG) 0.1 % Apply 1 application topically daily as needed. 05/01/15  Yes [provider]    Family History    Family History  Problem Relation Age of Onset  . Pancreatic cancer Father   . Cancer Father   . Heart Problems Mother   . Hypertension Mother   . Diabetes Brother   . Hypertension Brother   . Colon cancer Neg Hx    indicated that his mother is deceased. He indicated that his father is deceased. He indicated that the status of his brother is unknown. He indicated that the status of his neg hx is unknown.   Social History    Social History   Socioeconomic History  . Marital status: Married    Spouse name: Not on file  . Number of children: 2  . Years of education: post grad  . Highest education level: Not on file  Social Needs  . Financial resource strain: Not on file  . Food insecurity - worry: Not on file  . Food insecurity - inability: Not on file  . Transportation  needs - medical: Not on file  . Transportation needs - non-medical: Not on file  Occupational History  . 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 Smoking status: Former Smoker    Packs/day: 1.00    Years: 35.00    Pack years: 35.00    Types: Cigarettes    Last attempt to quit: 07/25/2005    Years since quitting: 11.8  . Smokeless tobacco: Never Used  Substance and Sexual Activity  . Alcohol use: Yes    Alcohol/week: 0.0 oz    Types: 1 Standard drinks or equivalent per week    Comment: rarely   . Drug use: No  . Sexual activity: Not on file  Other Topics Concern  . Not on file  Social History Narrative  . Not on file     Review of Systems    General:  No chills, fever, night sweats or weight changes.  Cardiovascular:  No chest pain, +++dyspnea on exertion,  +++ LE edema, no orthopnea, palpitations, paroxysmal nocturnal dyspnea.  +++ incr abd girth. Dermatological: No rash, lesions/masses Respiratory: No cough, +++ dyspnea Urologic: No hematuria, dysuria Abdominal:   incr abd girth/bloating.  No nausea, vomiting, diarrhea, bright red blood per rectum, melena, or hematemesis Neurologic:  No visual changes, wkns, changes in mental status. All other systems reviewed and are otherwise negative except as noted above.  Physical Exam    Blood pressure (!) 183/73, pulse (!) 56, temperature 97.7 F (36.5 C), temperature source Oral, resp. rate (!) 22, weight (!) 326 lb (147.9 kg), SpO2 99 %.  General: Pleasant, NAD Psych: Normal affect. Neuro: Alert and oriented X 3. Moves all extremities spontaneously. HEENT: Normal  Neck: Supple without carotids 2+ bilaterally with radiated bruits.  JVD to jaw. Lungs:  Resp regular and unlabored, diminished breath sounds at bases. Heart: RRR no s3, s4.  2/6 systolic ejection murmur loudest at the upper sternal borders but heard throughout. s2 ok Abdomen: Obese, protuberant, nontender, BS + x 4. +  distended  Extremities: No clubbing, cyanosis  2-3+ bilateral lower extremity edema to the mid to upper thighs. Wraps in place DP/PT/Radials 1+ and equal bilaterally.  Labs    Troponin [facility] of Care Test) Recent Labs    05/23/17 0954  TROPIPOC 0.03    Lab Results  Component Value Date   WBC 11.2 (H) 05/23/2017   HGB 10.5 (L) 05/23/2017   HCT 35.1 (L) 05/23/2017   MCV 94.6 05/23/2017   PLT 370 05/23/2017    Recent Labs  Lab 05/23/17 1155  NA 138  K 3.9  CL 104  CO2 28  BUN 16  CREATININE 1.14  CALCIUM 8.7*  PROT 6.6  BILITOT 0.8  ALKPHOS 79  ALT 26  AST 20  GLUCOSE 112*     Radiology Studies    Dg Chest 2 View  Result Date: 05/23/2017 CLINICAL DATA:  Acute shortness of breath today. EXAM: CHEST  2 VIEW COMPARISON:  04/11/2017 and prior chest radiographs FINDINGS: Cardiomegaly with pulmonary vascular congestion and interstitial edema noted. There is no evidence of pleural effusion or pneumothorax. No acute bony abnormality. IMPRESSION: Cardiomegaly with interstitial pulmonary edema. Electronically Signed   By: Margarette Canada M.D.   On: 05/23/2017 11:04    ECG & Cardiac Imaging    Regular sinus rhythm, 67, anterior infarct, nonspecific ST changes.  Assessment & Plan    1.  Acute on chronic diastolic congestive heart failure/hypertensive urgency: Patient presents with a several week history of progressive dyspnea on exertion, increasing lower extremity swelling, and increasing abdominal girth in the setting of poorly controlled hypertension and dietary noncompliance.  He also has been skipping Lasix doses from time to time.  Upon arrival, blood pressure reached 216/76.  Chest x-ray shows interstitial edema.  Initial troponin normal.  Plan to admit and aggressively diurese.  Continue home doses of beta-blocker and diltiazem and we will add Spironolactone as well as as needed hydralazine with a plan to potentially add twice daily hydralazine pending response.  Repeat  echocardiogram given moderate aortic stenosis noted in June 2018.  He will need extensive counseling on the importance of compliance with diet and symptom/weight reporting.  2.  Hypertensive urgency: As above, patient hypertensive on arrival in the setting of significant volume overload.  Resume beta-blocker and diltiazem, which he takes at home.  Diurese and add spironolactone as well as as needed hydralazine for the time being.  Continue ACE inhibitor.  3.  Chronic venous insufficiency: He is followed by vascular surgery and wound clinic at [facility].  We will ask wound team to see him here.  Diurese.  4.  Obstructive sleep apnea: He is compliant with CPAP and we will continue this here.  5.  Anemia: Stable.  6.  Type 2 diabetes mellitus: Continue Levemir and add sliding scale insulin.   Signed, Murray Hodgkins, NP 05/23/2017, 4:24 PM   Patient seen and examined independently. Emeline Gins, NP note reviewed carefully - agree with his assessment and plan. I have edited the note based on my findings.   67 y/o male with morbid obesity, OSA, moderate AS and diastolic HF presents with marked volume overload in setting of poorly controlled HTN due to dietary indiscretion. Responding well to IV lasix in ER with nearly 2L out. Will admit for further diuresis and BP control. Will add spiro and can add hydralazine as needed for BP control - though may be best to use only bid given compliance issues. Will get echo to reassess EF and aortic stenosis. WOC consult. Consider Jardiance for  DM2.   HF team will assume care.   Glori Bickers, MD  4:48 PM

## 2017-05-23 NOTE — Progress Notes (Signed)
Patient states he's taken dilaudid for chronic back pain and he takes "bayer back and body" everyday for his back pain. MD notified to add a pain medication. Awaiting orders.

## 2017-05-24 ENCOUNTER — Inpatient Hospital Stay (HOSPITAL_COMMUNITY): Payer: Medicare Other

## 2017-05-24 ENCOUNTER — Other Ambulatory Visit: Payer: Self-pay

## 2017-05-24 DIAGNOSIS — I361 Nonrheumatic tricuspid (valve) insufficiency: Secondary | ICD-10-CM

## 2017-05-24 DIAGNOSIS — G4733 Obstructive sleep apnea (adult) (pediatric): Secondary | ICD-10-CM

## 2017-05-24 LAB — BASIC METABOLIC PANEL
Anion gap: 8 (ref 5–15)
BUN: 14 mg/dL (ref 6–20)
CHLORIDE: 104 mmol/L (ref 101–111)
CO2: 28 mmol/L (ref 22–32)
Calcium: 8.8 mg/dL — ABNORMAL LOW (ref 8.9–10.3)
Creatinine, Ser: 1.15 mg/dL (ref 0.61–1.24)
GFR calc non Af Amer: >60 mL/min (ref >60)
GLUCOSE: 125 mg/dL — AB (ref 65–99)
Potassium: 3.6 mmol/L (ref 3.5–5.1)
Sodium: 140 mmol/L (ref 135–145)

## 2017-05-24 LAB — GLUCOSE, CAPILLARY
GLUCOSE-CAPILLARY: 99 mg/dL (ref 65–99)
GLUCOSE-CAPILLARY: 117 mg/dL — AB (ref 65–99)
GLUCOSE-CAPILLARY: 122 mg/dL — AB (ref 65–99)
Glucose-Capillary: 126 mg/dL — ABNORMAL HIGH (ref 65–99)
Glucose-Capillary: 62 mg/dL — ABNORMAL LOW (ref 65–99)

## 2017-05-24 LAB — ECHOCARDIOGRAM COMPLETE
Height: 72 in
Weight: 4963 [oz_av]

## 2017-05-24 LAB — MAGNESIUM: MAGNESIUM: 2.1 mg/dL (ref 1.7–2.4)

## 2017-05-24 MED ORDER — MUPIROCIN 2 % EX OINT
1.0000 "application " | TOPICAL_OINTMENT | Freq: Two times a day (BID) | CUTANEOUS | Status: DC
  Administered 2017-05-24 – 2017-05-25 (×5): 1 via NASAL
  Filled 2017-05-24: qty 22

## 2017-05-24 MED ORDER — POTASSIUM CHLORIDE CRYS ER 20 MEQ PO TBCR
40.0000 meq | EXTENDED_RELEASE_TABLET | Freq: Every day | ORAL | Status: DC
  Administered 2017-05-24 – 2017-05-26 (×3): 40 meq via ORAL
  Filled 2017-05-24 (×3): qty 2

## 2017-05-24 MED ORDER — AMMONIUM LACTATE 12 % EX LOTN
TOPICAL_LOTION | CUTANEOUS | Status: DC
  Administered 2017-05-24 – 2017-05-26 (×2): via TOPICAL
  Filled 2017-05-24: qty 400

## 2017-05-24 MED ORDER — HYDRALAZINE HCL 25 MG PO TABS
25.0000 mg | ORAL_TABLET | Freq: Two times a day (BID) | ORAL | Status: DC
  Administered 2017-05-24 (×2): 25 mg via ORAL
  Filled 2017-05-24 (×2): qty 1

## 2017-05-24 MED ORDER — CHLORHEXIDINE GLUCONATE CLOTH 2 % EX PADS
6.0000 | MEDICATED_PAD | Freq: Every day | CUTANEOUS | Status: DC
  Administered 2017-05-24 – 2017-05-25 (×2): 6 via TOPICAL

## 2017-05-24 NOTE — Progress Notes (Signed)
  Echocardiogram 2D Echocardiogram has been performed.  Mario Mills T Kieley Akter 05/24/2017, 9:43 AM

## 2017-05-24 NOTE — Progress Notes (Signed)
Hypoglycemic Event  CBG: 62  Treatment: [name] crackers/peanut butter/sprite  Symptoms: non symptomatic   Follow-up CBG: 1628 CBG Result:126  Possible Reasons for Event: Not eating a lot  Comments/MD notified:yes    Mario Mills

## 2017-05-24 NOTE — Progress Notes (Signed)
Cleansed patient's legs (bilateral), with soap and water. Put Lac-hydrin lotion on both legs. Wrapped with kurlex, then ACE wrap. Patient tolerated well.

## 2017-05-24 NOTE — Clinical Social Work Note (Signed)
CSW acknowledges consult regarding medication needs. RNCM already following.  CSW signing off. Consult again if any social work needs arise.  Dayton Scrape, [place]

## 2017-05-24 NOTE — Consult Note (Signed)
[place] Nurse wound consult note Reason for Consult: Chronic lymphedema.  Skin is intact at this time.  Wound type:Chronic lymphedema Pressure Injury POA: NA Measurement:Weeping to left posterior calf noted, no nonintact lesions noted at this time.   Wound bed:Dry skin from below knees to feet Drainage (amount, consistency, odor) minimal serosanguinous weeping  Musty odor Periwound:EDema dry skin to bilateral lower legs Dressing procedure/placement/frequency:Cleanse bilateral lower legs thoroughly with soap and water.  Apply AMlactin cream to lower legs.  Wrap with kerlix and secure with ace wrap.  Change Monday WEdnesday Friday. HE is to call the lymphedema clinic for appointment after discharge to re apply multilayer specialized wraps.  Will not follow at this time.  Please re-consult if needed.  Domenic Moras RN BSN [place] Pager [phone]

## 2017-05-24 NOTE — Progress Notes (Signed)
Advanced Heart Failure Rounding Note  PCP: Dr. Jeanie Cooks Primary Cardiologist: Dr Gwenlyn Found  Subjective:    Feeling better this am. Yesterday couldn't catch breath at rest, but this morning holding conversation without any dyspnea. Denies CP or lightheadedness/dizziness.   Creatinine stable at 1.15.  Negative 5.7 L yesterday. No weight recorded. Has had low CBGs  Echo reviewed personally EF 55-60% Moderate AS. RV ok   Objective:   Weight Range: (!) 310 lb 3 oz (140.7 kg) Body mass index is 42.07 kg/m.   Vital Signs:   Temp:  [97.7 F (36.5 C)-99.1 F (37.3 C)] 98.2 F (36.8 C) (01/07 0639) Pulse Rate:  [51-77] 62 (01/07 0639) Resp:  [13-24] 18 (01/07 0639) BP: (159-216)/(55-96) 164/84 (01/07 0639) SpO2:  [91 %-100 %] 96 % (01/07 0639) Weight:  [310 lb 3 oz (140.7 kg)-326 lb (147.9 kg)] 310 lb 3 oz (140.7 kg) (01/06 1824) Last BM Date: 05/22/17  Weight change: Filed Weights   05/23/17 0929 05/23/17 1824  Weight: (!) 326 lb (147.9 kg) (!) 310 lb 3 oz (140.7 kg)   Intake/Output:   Intake/Output Summary (Last 24 hours) at 05/24/2017 0749 Last data filed at 05/24/2017 0645 Gross per 24 hour  Intake 720 ml  Output 6500 ml  Net -5780 ml     Physical Exam    General:  NAD sitting up on side of bed HEENT: Normal Neck: Supple. JVP difficult with body habitus, but appears elevated to jaw. Carotids 2+ bilat; + radiated bruits. No lymphadenopathy or thyromegaly appreciated. Cor: PMI nondisplaced. Regular rate & rhythm, slightly brady. 2/3 SEM loudest at USB, but heard throughout. s2 mildly decreased.  Lungs: Distant, diminished basilar sounds.  Abdomen: Obese, nontender, + distended. No hepatosplenomegaly. No bruits or masses. Good bowel sounds. Extremities: No cyanosis or clubbing. 2-3+ bilateral edema to thighs. Neuro: Alert & orientedx3, cranial nerves grossly intact. moves all 4 extremities w/o difficulty. Affect pleasant  Telemetry   NSR/Sinus brady, 50-60s, personally  reviewed.   EKG    No new tracings.   Labs    CBC Recent Labs    05/23/17 1155  WBC 11.2*  NEUTROABS 9.0*  HGB 10.5*  HCT 35.1*  MCV 94.6  PLT 073   Basic Metabolic Panel Recent Labs    05/23/17 1155 05/24/17 0508  NA 138 140  K 3.9 3.6  CL 104 104  CO2 28 28  GLUCOSE 112* 125*  BUN 16 14  CREATININE 1.14 1.15  CALCIUM 8.7* 8.8*   Liver Function Tests Recent Labs    05/23/17 1155  AST 20  ALT 26  ALKPHOS 79  BILITOT 0.8  PROT 6.6  ALBUMIN 2.8*   No results for input(s): LIPASE, AMYLASE in the last 72 hours. Cardiac Enzymes No results for input(s): CKTOTAL, CKMB, CKMBINDEX, TROPONINI in the last 72 hours.  BNP: BNP (last 3 results) Recent Labs    04/11/17 1321 05/23/17 1155  BNP 298.7* 488.7*    ProBNP (last 3 results) No results for input(s): PROBNP in the last 8760 hours.   D-Dimer No results for input(s): DDIMER in the last 72 hours. Hemoglobin A1C No results for input(s): HGBA1C in the last 72 hours. Fasting Lipid Panel No results for input(s): CHOL, HDL, LDLCALC, TRIG, CHOLHDL, LDLDIRECT in the last 72 hours. Thyroid Function Tests No results for input(s): TSH, T4TOTAL, T3FREE, THYROIDAB in the last 72 hours.  Invalid input(s): FREET3  Other results:   Imaging    Dg Chest 2 View  Result  Date: 05/23/2017 CLINICAL DATA:  Acute shortness of breath today. EXAM: CHEST  2 VIEW COMPARISON:  04/11/2017 and prior chest radiographs FINDINGS: Cardiomegaly with pulmonary vascular congestion and interstitial edema noted. There is no evidence of pleural effusion or pneumothorax. No acute bony abnormality. IMPRESSION: Cardiomegaly with interstitial pulmonary edema. Electronically Signed   By: Margarette Canada M.D.   On: 05/23/2017 11:04      Medications:     Scheduled Medications: . aspirin EC  81 mg Oral Daily  . atorvastatin  40 mg Oral QPM  . carvedilol  12.5 mg Oral BID WC  . Chlorhexidine Gluconate Cloth  6 each Topical Q0600  .  diazepam  5 mg Oral Daily  . diclofenac  75 mg Oral BID  . diltiazem  360 mg Oral Daily  . fesoterodine  4 mg Oral Daily  . folic acid  1 mg Oral Daily  . furosemide  80 mg Intravenous BID  . glimepiride  4 mg Oral QAC breakfast  . heparin  5,000 Units Subcutaneous Q8H  . insulin aspart  0-15 Units Subcutaneous TID WC  . insulin detemir  20 Units Subcutaneous QHS  . lisinopril  40 mg Oral Daily  . mupirocin ointment  1 application Nasal BID  . potassium chloride SA  20 mEq Oral Daily  . sodium chloride flush  3 mL Intravenous Q12H  . spironolactone  25 mg Oral Daily  . tamsulosin  0.4 mg Oral QHS     Infusions: . sodium chloride       PRN Medications:  sodium chloride, acetaminophen, albuterol, hydrALAZINE, ondansetron (ZOFRAN) IV, sodium chloride flush    Patient Profile     67 year old male with a prior history of HFpEF, hypertension, hyperlipidemia, obesity, diabetes, GERD, chronic venous stasis, moderate aortic stenosis, and reported history of paroxysmal atrial fibrillation, who presents to the emergency department with a several week history of progressive dyspnea and hypertensive urgency.    Assessment/Plan   1.  Acute on chronic diastolic congestive heart failure - remains markedly volume overloaded with great response to 80 mg IV lasix BID - Continue same. Will supp K.  - Repeat echo pending.  - Will need extensive counseling. Will ask HF nurse nagivator to see.   2.  Hypertensive urgency:  - Systolic pressure remains in 160s this am.  - Continue coreg 12.5 mg BID - Continue diltiazem 260 mg daily.  - Continue spiro 25 mg daily. - Add hydralazine 25 mg BID (for now) with ? Compliance.  - Continue prn IV hydralazine.   3.  Chronic venous insufficiency:  - WOC asked to follow here - He is followed by vascular surgery and wound clinic at [place] will improve somewhat with diuresis.   4.  Obstructive sleep apnea:  - Continue nightly  CPAP. Reports compliance at home.   5.  Anemia: - Stable. No bleeding. Hgb 10.5 05/23/17.   6.  Type 2 diabetes mellitus:  - Continue SSI while in house.   7. Aortic stenosis - moderate by echo   Medication concerns reviewed with patient and pharmacy team. Barriers identified: Compliance  Length of Stay: 1  Shirley Friar, PA-C  05/24/2017, 7:49 AM  Advanced Heart Failure Team Pager [phone] (M-F; 7a - 4p)  Please contact [facility] for night-coverage after hours (4p -7a ) and weekends on [email or web address]  Patient seen and examined with the above-signed Advanced Practice Provider and/or Housestaff. I personally reviewed laboratory data, imaging studies and relevant  notes. I independently examined the patient and formulated the important aspects of the plan. I have edited the note to reflect any of my changes or salient points. I have personally discussed the plan with the patient and/or family.  Multiple issues. Volume status improving but still overloaded. Continue IV lasix. Echo reviewed personally and EF normal with moderate AS. BP remains elevated - continue to titrate anti-HTN meds. CBGs running low so nighttime insulin held. Would consider Jardiance at d/c. Continue CPAP.   Glori Bickers, MD  10:24 PM

## 2017-05-24 NOTE — Progress Notes (Addendum)
Heart Failure Navigator Consult Note  Presentation: Per Dr Haroldine Laws: Mario Mills is a 67 year old male with a prior history of HFpEF, hypertension, hyperlipidemia, obesity, diabetes, GERD, chronic venous stasis, moderate aortic stenosis, and reported history of paroxysmal atrial fibrillation, who presents to the emergency department with a several week history of progressive dyspnea and hypertensive urgency.   Past Medical History:  Diagnosis Date  . Arthritis   . Chronic diastolic heart failure ([place])    a. 10/2016 Echo: EF 55-60%, gr2 DD, triv AI, mod dil LA, mildly dil RV/RA.  [name] Chronic low back pain with sciatica   . Constipation   . DM (diabetes mellitus) type II controlled peripheral vascular disorder   . GERD (gastroesophageal reflux disease)   . History of stress test    a. 04/2015 MV: EF 47%, mild basal inflat and mid inflat defect c/w ischemia. EF nl by echo @ that time and no further isch w/u pursued.  . Hyperlipidemia   . Hypertension   . Hypokalemia   . Microcytic anemia   . Moderate aortic stenosis    a. 10/2016 Echo: Mod AS.  . Numbness and tingling    feet bilat   . Obstructive sleep apnea    a. compliant w/ CPAP.  [name] Peripheral arterial disease ([place])   . Primary osteoarthritis of left knee   . Urinary incontinence   . Venous insufficiency    a. chronic LEE-->wound mgmt/wraps  . Venous stasis ulcer ([place])     Social History   Socioeconomic History  . Marital status: Married    Spouse name: None  . Number of children: 2  . Years of education: post grad  . Highest education level: None  Social Needs  . Financial resource strain: None  . Food insecurity - worry: None  . Food insecurity - inability: None  . Transportation needs - medical: None  . Transportation needs - non-medical: None  Occupational History  . 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 Smoking status: Former Smoker    Packs/day: 1.00   Years: 35.00    Pack years: 35.00    Types: Cigarettes    Last attempt to quit: 07/25/2005    Years since quitting: 11.8  . Smokeless tobacco: Never Used  Substance and Sexual Activity  . Alcohol use: Yes    Alcohol/week: 0.0 oz    Types: 1 Standard drinks or equivalent per week    Comment: rarely   . Drug use: No  . Sexual activity: None  Other Topics Concern  . None  Social History Narrative  . None    ECHO:Study Conclusions--1//7/19  - Left ventricle: The cavity size was mildly dilated. There was   severe concentric hypertrophy. Systolic function was normal. The   estimated ejection fraction was in the range of 60% to 65%. Wall   motion was normal; there were no regional wall motion   abnormalities. The study is not technically sufficient to allow   evaluation of LV diastolic function. - Aortic valve: There was moderate stenosis. There was mild   regurgitation. Valve area (VTI): 1.04 cm^2. Valve area (Vmax):   1.05 cm^2. Valve area (Vmean): 1.04 cm^2. - Left atrium: The atrium was moderately dilated. - Right ventricle: The cavity size was moderately dilated. Wall   thickness was normal. - Right atrium: The atrium was moderately dilated. - Pulmonary arteries: Systolic pressure could not be accurately   estimated. - Pericardium, extracardiac: A  trivial, free-flowing pericardial   effusion was identified circumferential to the heart. The fluid   had no internal echoes.   BNP    Component Value Date/Time   BNP 488.7 (H) 05/23/2017 1155    ProBNP No results found for: PROBNP   Education Assessment and Provision:  Detailed education and instructions provided on heart failure disease management including the following:  Signs and symptoms of Heart Failure When to call the physician Importance of daily weights Low sodium diet Fluid restriction Medication management Anticipated future follow-up appointments  Patient education given on each of the above  topics.  Patient acknowledges understanding and acceptance of all instructions.  I spoke with [name] regarding his hospitalization and HF diagnosis.  He tells me that he does not own a scale.  I reviewed the importance of daily weights and how they relate to signs and symptoms of HF.  I also provided a scale for him.  He tells me that he spends times living between his group home (that he owns) and his home in [place].  He spends much time working--therefore skips doses of Furosemide due to increased need to urinate.  I discouraged this practice and encouraged him to take all meds as prescribed.  I reviewed a low sodium diet and high sodium foods to avoid.  He admits that he does not " pay much attention" to sodium content currently.  He also admits that he drinks "a lot" of fluid.  I reviewed fluid restriction and the the reasons behind it.  He will follow in the AHF Clinic after discharge.  Education Materials:  "Living Better With Heart Failure" Booklet, Daily Weight Tracker Tool   High Risk Criteria for Readmission and/or Poor Patient Outcomes:  (Recommend Follow-up with Advanced Heart Failure Clinic)-- yes    EF <30%- No 60-65%  2 or more admissions in 6 months- No  Difficult social situation- No  Demonstrates medication noncompliance- yes- does not take Lasix regularly due to increased urination  Barriers of Care:  Knowledge and compliance  Discharge Planning:   Plans to return to home in [facility].  He would be a good candidate for the Paramedicine program however secondary to his group home being located in [facility] and the complexities of his schedule I will not make the referral at this time.

## 2017-05-24 NOTE — Care Management Note (Signed)
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 He continues to drive his care;  PCP: Dr. Jeanie Cooks; has private insurance with Medicare / [employer] with prescription drug coverage; [facility]. DME - use a cane as needed. He cooks a diet low in sodium; CM will continue to follow for progression of care.  Expected Discharge Date:  05/26/17               Expected Discharge Plan:  Home/Self Care   Discharge planning Services  CM Consult  Status of Service:  In process, will continue to follow  Sherrilyn Rist [phone] 05/24/2017, 10:07 AM

## 2017-05-25 DIAGNOSIS — I160 Hypertensive urgency: Secondary | ICD-10-CM

## 2017-05-25 LAB — BASIC METABOLIC PANEL
Anion gap: 8 (ref 5–15)
BUN: 15 mg/dL (ref 6–20)
CO2: 31 mmol/L (ref 22–32)
Calcium: 8.8 mg/dL — ABNORMAL LOW (ref 8.9–10.3)
Chloride: 101 mmol/L (ref 101–111)
Creatinine, Ser: 1.24 mg/dL (ref 0.61–1.24)
GFR calc Af Amer: >60 mL/min (ref >60)
GFR calc non Af Amer: 58 mL/min — ABNORMAL LOW (ref >60)
GLUCOSE: 116 mg/dL — AB (ref 65–99)
Potassium: 3.4 mmol/L — ABNORMAL LOW (ref 3.5–5.1)
Sodium: 140 mmol/L (ref 135–145)

## 2017-05-25 LAB — GLUCOSE, CAPILLARY
GLUCOSE-CAPILLARY: 110 mg/dL — AB (ref 65–99)
GLUCOSE-CAPILLARY: 133 mg/dL — AB (ref 65–99)
GLUCOSE-CAPILLARY: 82 mg/dL (ref 65–99)
Glucose-Capillary: 107 mg/dL — ABNORMAL HIGH (ref 65–99)

## 2017-05-25 MED ORDER — POTASSIUM CHLORIDE CRYS ER 20 MEQ PO TBCR
40.0000 meq | EXTENDED_RELEASE_TABLET | Freq: Once | ORAL | Status: AC
  Administered 2017-05-25: 40 meq via ORAL
  Filled 2017-05-25: qty 2

## 2017-05-25 MED ORDER — HYDRALAZINE HCL 50 MG PO TABS
50.0000 mg | ORAL_TABLET | Freq: Two times a day (BID) | ORAL | Status: DC
  Administered 2017-05-25 (×2): 50 mg via ORAL
  Filled 2017-05-25 (×3): qty 1

## 2017-05-25 MED ORDER — MAGNESIUM HYDROXIDE 400 MG/5ML PO SUSP
15.0000 mL | Freq: Once | ORAL | Status: DC
  Filled 2017-05-25: qty 30

## 2017-05-25 NOTE — Plan of Care (Signed)
  Education: Knowledge of General Education information will improve 05/25/2017 2354 - Adequate for Discharge by Tristan Schroeder, RN   Health Behavior/Discharge Planning: Ability to manage health-related needs will improve 05/25/2017 2354 - Adequate for Discharge by Tristan Schroeder, RN   Clinical Measurements: Ability to maintain clinical measurements within normal limits will improve 05/25/2017 2354 - Adequate for Discharge by Tristan Schroeder, RN

## 2017-05-25 NOTE — Evaluation (Addendum)
Physical Therapy Evaluation Patient Details Name: Mario Mills MRN: [id number] DOB: 01/31/1951 Today's Date: 05/25/2017   History of Present Illness  Pt is a 67 y.o. male admitted 05/23/17 with several week h/o progressive dyspnea and HTN urgency; worked up for CHF. Pertinent PMH includes HF, HTN, HLD, obesity, DM, GERD, chronic venous stasis, moderate aortic stenosis, a-fib, obstructive sleep apnea on nightly CPAP.    Clinical Impression  Patient evaluated by Physical Therapy with no further acute PT needs identified. PTA, pt mod indep with SPC. All education has been completed and the patient has no further questions. Today, pt is mod indep with SPC for transfers and ambulation. Encouraged to continue ambulating in hallway during hospital stay. PT is signing off. Thank you for this referral.    Follow Up Recommendations No PT follow up    Equipment Recommendations  None recommended by PT    Recommendations for Other Services       Precautions / Restrictions Precautions Precautions: None Restrictions Weight Bearing Restrictions: No      Mobility  Bed Mobility Overal bed mobility: Independent                Transfers Overall transfer level: Independent                  Ambulation/Gait Ambulation/Gait assistance: Modified independent (Device/Increase time) Ambulation Distance (Feet): 200 Feet Assistive device: Straight cane Gait Pattern/deviations: Step-through pattern;Decreased stride length        Stairs            Wheelchair Mobility    Modified Rankin (Stroke Patients Only)       Balance Overall balance assessment: Needs assistance   Sitting balance-Leahy Scale: Good       Standing balance-Leahy Scale: Fair Standing balance comment: Can static stand with no UE support; uses SPC for mobility                             Pertinent Vitals/Pain Pain Assessment: No/denies pain    Home Living Family/patient expects to be  discharged to:: Private residence Living Arrangements: Non-relatives/Friends   Type of Home: House         Home Equipment: Kasandra Knudsen - single point;Walker - standard Additional Comments: Pt 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, 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, prior to returning home to house with wife and son    Prior Function Level of Independence: Independent with assistive device(s)         Comments: uses SPC     Hand Dominance        Extremity/Trunk Assessment   Upper Extremity Assessment Upper Extremity Assessment: Overall WFL for tasks assessed    Lower Extremity Assessment Lower Extremity Assessment: Overall WFL for tasks assessed       Communication   Communication: No difficulties  Cognition Arousal/Alertness: Awake/alert Behavior During Therapy: WFL for tasks assessed/performed Overall Cognitive Status: Within Functional Limits for tasks assessed                                        General Comments      Exercises     Assessment/Plan    PT Assessment Patent does not need any further PT services  PT Problem List         PT  Treatment Interventions      PT Goals (Current goals can be found in the Care Plan section)  Acute Rehab PT Goals PT Goal Formulation: All assessment and education complete, DC therapy    Frequency     Barriers to discharge        Co-evaluation               AM-PAC PT "6 Clicks" Daily Activity  Outcome Measure Difficulty turning over in bed (including adjusting bedclothes, sheets and blankets)?: None Difficulty moving from lying on back to sitting on the side of the bed? : None Difficulty sitting down on and standing up from a chair with arms (e.g., wheelchair, bedside commode, etc,.)?: None Help needed moving to and from a bed to chair (including a wheelchair)?: None Help needed walking in hospital room?: None Help needed climbing 3-5 steps with a  railing? : None 6 Click Score: 24    End of Session Equipment Utilized During Treatment: Gait belt Activity Tolerance: Patient tolerated treatment well Patient left: in bed;with call bell/phone within reach Nurse Communication: Mobility status PT Visit Diagnosis: Other abnormalities of gait and mobility (R26.89)    Time: 4562-5638 PT Time Calculation (min) (ACUTE ONLY): 24 min   Charges:   PT Evaluation $PT Eval Moderate Complexity: 1 Mod PT Treatments $Gait Training: 8-22 mins   PT G Codes:       Mabeline Caras, PT, DPT Acute Rehab Services  Pager: [place] 05/25/2017, 10:01 AM

## 2017-05-25 NOTE — Plan of Care (Signed)
  Health Behavior/Discharge Planning: Ability to manage health-related needs will improve 05/25/2017 0033 - Progressing by Tristan Schroeder, RN   Clinical Measurements: Cardiovascular complication will be avoided 05/25/2017 0033 - Progressing by Tristan Schroeder, RN

## 2017-05-25 NOTE — Progress Notes (Signed)
Visit made to patients room to place on Cpap .Mario Mills Pt ask for me to add water to cpap and check setting and he will administer when he is ready for bed.  I advised patient to have RN call RT if he has any issues.

## 2017-05-25 NOTE — Progress Notes (Signed)
Advanced Heart Failure Rounding Note  PCP: Dr. Jeanie Cooks Primary Cardiologist: Dr Gwenlyn Found  Subjective:    Swelling and breathing continue to improve. Denies lightheadedness or dizziness but didn't get up much yesterday. PT consulted. Denies CP. Anxious to get home.  He agrees that he would have a very hard time taking hydralazine 3 times a day.   Creatinine stable at 1.24. K 3.4. Negative 3.1 L and down another 6 lbs.  Echo 05/24/17 LVEF 55-60% Moderate AS. RV ok   Objective:   Weight Range: 298 lb 3.2 oz (135.3 kg) Body mass index is 40.44 kg/m.   Vital Signs:   Temp:  [98.2 F (36.8 C)-98.6 F (37 C)] 98.6 F (37 C) (01/08 0544) Pulse Rate:  [51-68] 58 (01/08 0544) Resp:  [16-20] 16 (01/08 0544) BP: (145-198)/(64-93) 183/64 (01/08 0650) SpO2:  [94 %-98 %] 97 % (01/08 0544) Weight:  [298 lb 3.2 oz (135.3 kg)] 298 lb 3.2 oz (135.3 kg) (01/08 0544) Last BM Date: 05/22/17  Weight change: Filed Weights   05/23/17 1824 05/24/17 0639 05/25/17 0544  Weight: (!) 310 lb 3 oz (140.7 kg) (!) 304 lb (137.9 kg) 298 lb 3.2 oz (135.3 kg)   Intake/Output:   Intake/Output Summary (Last 24 hours) at 05/25/2017 0730 Last data filed at 05/25/2017 2330 Gross per 24 hour  Intake 963 ml  Output 4075 ml  Net -3112 ml     Physical Exam    General: NAD. Wearing CPAP from overnight. HEENT: Normal Neck: Supple. JVP difficult with body habitus but appears elevated. Carotids 2+ bilat; no bruits. No thyromegaly or nodule noted. Cor: PMI nondisplaced. RRR, 2/3 SEM loudest at USB, heard throughout, s2 mildly decreased Lungs: Distant, diminished basilar sounds.  Abdomen: Obese, non-tender, ++distended, no HSM. No bruits or masses. +BS  Extremities: No cyanosis or clubbing. Chronic venous stasis changes. 2+ edema into thighs. Improving.  Neuro: Alert & orientedx3, cranial nerves grossly intact. moves all 4 extremities w/o difficulty. Affect pleasant    Telemetry   Sinus Brady/ NSR 50-60s,  personally reviewed.   EKG    No new tracings.   Labs    CBC Recent Labs    05/23/17 1155  WBC 11.2*  NEUTROABS 9.0*  HGB 10.5*  HCT 35.1*  MCV 94.6  PLT 076   Basic Metabolic Panel Recent Labs    05/24/17 0508 05/25/17 0616  NA 140 140  K 3.6 3.4*  CL 104 101  CO2 28 31  GLUCOSE 125* 116*  BUN 14 15  CREATININE 1.15 1.24  CALCIUM 8.8* 8.8*  MG 2.1  --    Liver Function Tests Recent Labs    05/23/17 1155  AST 20  ALT 26  ALKPHOS 79  BILITOT 0.8  PROT 6.6  ALBUMIN 2.8*   No results for input(s): LIPASE, AMYLASE in the last 72 hours. Cardiac Enzymes No results for input(s): CKTOTAL, CKMB, CKMBINDEX, TROPONINI in the last 72 hours.  BNP: BNP (last 3 results) Recent Labs    04/11/17 1321 05/23/17 1155  BNP 298.7* 488.7*    ProBNP (last 3 results) No results for input(s): PROBNP in the last 8760 hours.   D-Dimer No results for input(s): DDIMER in the last 72 hours. Hemoglobin A1C No results for input(s): HGBA1C in the last 72 hours. Fasting Lipid Panel No results for input(s): CHOL, HDL, LDLCALC, TRIG, CHOLHDL, LDLDIRECT in the last 72 hours. Thyroid Function Tests No results for input(s): TSH, T4TOTAL, T3FREE, THYROIDAB in the last 72 hours.  Invalid input(s): FREET3  Other results:   Imaging    No results found.   Medications:     Scheduled Medications: . ammonium lactate   Topical Once per day on Mon Wed Fri  . aspirin EC  81 mg Oral Daily  . atorvastatin  40 mg Oral QPM  . carvedilol  12.5 mg Oral BID WC  . Chlorhexidine Gluconate Cloth  6 each Topical Q0600  . diazepam  5 mg Oral Daily  . diclofenac  75 mg Oral BID  . diltiazem  360 mg Oral Daily  . fesoterodine  4 mg Oral Daily  . folic acid  1 mg Oral Daily  . furosemide  80 mg Intravenous BID  . glimepiride  4 mg Oral QAC breakfast  . heparin  5,000 Units Subcutaneous Q8H  . hydrALAZINE  25 mg Oral BID  . insulin aspart  0-15 Units Subcutaneous TID WC  .  lisinopril  40 mg Oral Daily  . magnesium hydroxide  15 mL Oral Once  . mupirocin ointment  1 application Nasal BID  . potassium chloride SA  40 mEq Oral Daily  . sodium chloride flush  3 mL Intravenous Q12H  . spironolactone  25 mg Oral Daily  . tamsulosin  0.4 mg Oral QHS    Infusions: . sodium chloride      PRN Medications: sodium chloride, acetaminophen, albuterol, hydrALAZINE, ondansetron (ZOFRAN) IV, sodium chloride flush    Patient Profile     67 year old male with a prior history of HFpEF, hypertension, hyperlipidemia, obesity, diabetes, GERD, chronic venous stasis, moderate aortic stenosis, and reported history of paroxysmal atrial fibrillation, who presents to the emergency department with a several week history of progressive dyspnea and hypertensive urgency.    Assessment/Plan   1.  Acute on chronic diastolic congestive heart failure - Echo 05/24/17 LVEF 55-60% Moderate AS. RV ok  - Volume status remains elevated but with continued good response to lasix 80 mg IV BID.  - Continue IV lasix at least today.  - Supp K. Will give extra 40 meq today.  - HF navigator has seen for counseling. Does not live in [place].   2.  Hypertensive urgency:  - Systolic pressures remain elevated into 160-180s. - Continue coreg 12.5 mg BID - Continue diltiazem 260 mg daily.  - Continue spiro 25 mg daily. - Continue lisinopril 40 mg daily - Increase hydralazine to 75 mg BID with poor compliance Compliance. - May need to consider clonidine or doxazosin - Continue prn IV hydralazine.  - Pt had Renal US in 05/2016 with medical renal disease. No mention of RAS.   3.  Chronic venous insufficiency:  - Appreciate WOC care.  - He [facility].    4.  Obstructive sleep apnea:  - Continue CPAP qhs.   5.  Anemia: - Stable. No bleeding.   6.  Type 2 diabetes mellitus:  - Continue SSI while in house.   7. Aortic  stenosis - Moderate by echo 05/24/17.   8. Deconditioning - PT consulted  9. Non-consulted - Lives in group home in [place]. Outside of Paramedicine range.   Continue IV diuresis at least today. Pt anxious about going home and would like to go tomorrow if stabilized. Suspect he will need at least 1-2 more days of diuresis.   Medication concerns reviewed with patient and pharmacy team. Barriers identified: Compliance  Length of Stay: 2  Shirley Friar, PA-C  05/25/2017, 7:30 AM  Advanced Heart Failure Team Pager [phone] (M-F; 7a - 4p)  Please contact [facility] for night-coverage after hours (4p -7a ) and weekends on [email or web address]  Patient seen and examined with the above-signed Advanced Practice Provider and/or Housestaff. I personally reviewed laboratory data, imaging studies and relevant notes. I independently examined the patient and formulated the important aspects of the plan. I have edited the note to reflect any of my changes or salient points. I have personally discussed the plan with the patient and/or family.  Continues to diurese well. Still with volume on board. BP still markedly elevated. Continue IV diurese. Continue to titrate anti-HTN meds. Hopefully home tomorrow or Thursday.  Glori Bickers, MD  9:09 AM

## 2017-05-25 NOTE — Plan of Care (Signed)
  Education: Knowledge of General Education information will improve 05/25/2017 0750 - Progressing by Lavonna Rua, RN

## 2017-05-26 LAB — BASIC METABOLIC PANEL
Anion gap: 8 (ref 5–15)
BUN: 19 mg/dL (ref 6–20)
CALCIUM: 8.8 mg/dL — AB (ref 8.9–10.3)
CHLORIDE: 100 mmol/L — AB (ref 101–111)
CO2: 31 mmol/L (ref 22–32)
Creatinine, Ser: 1.37 mg/dL — ABNORMAL HIGH (ref 0.61–1.24)
GFR calc non Af Amer: 51 mL/min — ABNORMAL LOW (ref >60)
GFR, EST AFRICAN AMERICAN: 60 mL/min — AB (ref >60)
Glucose, Bld: 145 mg/dL — ABNORMAL HIGH (ref 65–99)
Potassium: 3.6 mmol/L (ref 3.5–5.1)
Sodium: 139 mmol/L (ref 135–145)

## 2017-05-26 LAB — GLUCOSE, CAPILLARY: Glucose-Capillary: 106 mg/dL — ABNORMAL HIGH (ref 65–99)

## 2017-05-26 MED ORDER — HYDRALAZINE HCL 50 MG PO TABS
75.0000 mg | ORAL_TABLET | Freq: Two times a day (BID) | ORAL | Status: DC
  Administered 2017-05-26: 11:00:00 75 mg via ORAL

## 2017-05-26 MED ORDER — SPIRONOLACTONE 25 MG PO TABS: 25.0000 mg | ORAL_TABLET | Freq: Every day | ORAL | 6 refills | Status: DC

## 2017-05-26 MED ORDER — ASPIRIN 81 MG PO TBEC: 81.0000 mg | DELAYED_RELEASE_TABLET | Freq: Every day | ORAL | 6 refills | Status: DC

## 2017-05-26 MED ORDER — TORSEMIDE 20 MG PO TABS: 20.0000 mg | ORAL_TABLET | Freq: Every day | ORAL | Status: DC

## 2017-05-26 MED ORDER — TORSEMIDE 20 MG PO TABS
40.0000 mg | ORAL_TABLET | Freq: Every day | ORAL | Status: DC
  Administered 2017-05-26: 40 mg via ORAL
  Filled 2017-05-26: qty 2

## 2017-05-26 MED ORDER — POTASSIUM CHLORIDE CRYS ER 20 MEQ PO TBCR: 40.0000 meq | EXTENDED_RELEASE_TABLET | Freq: Every day | ORAL | 3 refills | Status: DC

## 2017-05-26 MED ORDER — HYDRALAZINE HCL 25 MG PO TABS: 75.0000 mg | ORAL_TABLET | Freq: Two times a day (BID) | ORAL | 6 refills | Status: DC

## 2017-05-26 MED ORDER — TORSEMIDE 20 MG PO TABS: ORAL_TABLET | ORAL | 6 refills | Status: DC

## 2017-05-26 NOTE — Progress Notes (Addendum)
Benefit check in progress for [name] [phone]   RE: Benefit check  Received: Today  Message Contents  Memory Argue CMA        # 1. S/W  KESHA @ [employer] # [phone]    TORSEMIDE 40 MG - NO   1. TORSEMIDE 20 MG BID  COVER- YES  CO-PAY- $ 9.50  TIER- 2 DRUG  PRIOR APPROVAL- NO   DEMADEX: NONE FORMULARY   PRE[facility]ACY : [facility]

## 2017-05-26 NOTE — Discharge Summary (Signed)
Advanced Heart Failure Discharge Note  Discharge Summary   Patient ID: Mario Mills MRN: [id number], DOB/AGE: 10-01-1950 67 y.o. Admit date: 05/23/2017 D/C date:     05/26/2017   Primary Discharge Diagnoses:  1. Acute on chronic diastolic congestive heart failure 2. Hypertensive urgency 3. Chronic venous insufficiency 4. Obstructive sleep apnea 5. Anemia 6. Type 2 diabetes mellitus 7. Aortic stenosis, noiderate 8. Deconditioning 9. Non-compliance  Hospital Course:   Mario Mills is a 67 y.o. male with a prior history of HFpEF, hypertension, hyperlipidemia, obesity, diabetes, GERD, chronic venous stasis, moderate aortic stenosis, and reported history of paroxysmal atrial fibrillation, who presents to the emergency department with a several week history of progressive dyspnea and hypertensive urgency.  Pt presented to [facility]/23/2017 with progressive SOB. Found to have marked volume overload despite adjustment of outpatient diuretics. Pt also noted to have hypertensive urgency with systolic BP > 536 on arrival.   Treated with IV lasix with great response.  Arlyce Harman and hydralazine added to his home medications.   Pt continued to diurese on 80 mg IV lasix BID.  There was some question about compliance at home.  Overall pt diuresed 28 lbs down.     Hospital course additionally complicated by on-going hypertensive urgency.  Improved with med titration, but he continued to have pressures in 160-180s, so hydralazine increased again on day of discharge.  Home lasix transitioned to torsemide for discharge.   Pt examined am of 05/26/17 and thought stable for discharge with very close follow up as below for follow up labs and continued BP medication titration. Discussed alarm symptoms of hypertension with patient. Pt knows to come to ED with any worsening symptoms.   Discharge Weight Range: 292 lbs Discharge Vitals: Blood pressure (!) 181/67, pulse 61, temperature 98.4 F (36.9 C),  temperature source Oral, resp. rate 18, height 6' (1.829 m), weight 292 lb (132.5 kg), SpO2 97 %.  Labs: Lab Results  Component Value Date   WBC 11.2 (H) 05/23/2017   HGB 10.5 (L) 05/23/2017   HCT 35.1 (L) 05/23/2017   MCV 94.6 05/23/2017   PLT 370 05/23/2017    Recent Labs  Lab 05/23/17 1155  05/26/17 0417  NA 138   < > 139  K 3.9   < > 3.6  CL 104   < > 100*  CO2 28   < > 31  BUN 16   < > 19  CREATININE 1.14   < > 1.37*  CALCIUM 8.7*   < > 8.8*  PROT 6.6  --   --   BILITOT 0.8  --   --   ALKPHOS 79  --   --   ALT 26  --   --   AST 20  --   --   GLUCOSE 112*   < > 145*   < > = values in this interval not displayed.   No results found for: CHOL, HDL, LDLCALC, TRIG BNP (last 3 results) Recent Labs    04/11/17 1321 05/23/17 1155  BNP 298.7* 488.7*    ProBNP (last 3 results) No results for input(s): PROBNP in the last 8760 hours.   Diagnostic Studies/Procedures   No results found.  Discharge Medications   Allergies as of 05/26/2017   No Known Allergies     Medication List    STOP taking these medications   aspirin 325 MG tablet Replaced by:  aspirin 81 MG EC tablet   furosemide 40 MG tablet Commonly known  as:  LASIX     TAKE these medications   aspirin 81 MG EC tablet Take 1 tablet (81 mg total) by mouth daily. Replaces:  aspirin 325 MG tablet   atorvastatin 40 MG tablet Commonly known as:  LIPITOR Take 40 mg by mouth every evening.   carvedilol 12.5 MG tablet Commonly known as:  COREG Take 12.5 mg by mouth 2 (two) times daily with a meal.   diazepam 5 MG tablet Commonly known as:  VALIUM Take 5 mg by mouth daily.   diclofenac 75 MG EC tablet Commonly known as:  VOLTAREN Take 75 mg by mouth 2 (two) times daily.   diltiazem 360 MG 24 hr capsule Commonly known as:  CARDIZEM CD Take 1 capsule (360 mg total) by mouth daily.   folic acid 1 MG tablet Commonly known as:  FOLVITE Take 1 mg by mouth daily.   glimepiride 4 MG  tablet Commonly known as:  AMARYL Take 4 mg by mouth daily before breakfast. Reported on 09/24/2015   hydrALAZINE 25 MG tablet Commonly known as:  APRESOLINE Take 3 tablets (75 mg total) by mouth 2 (two) times daily.   hydrOXYzine 25 MG tablet Commonly known as:  ATARAX/VISTARIL Take 25 mg by mouth 2 (two) times daily as needed for itching.   LEVEMIR FLEXTOUCH 100 UNIT/ML Pen Generic drug:  Insulin Detemir Inject 20 Units into the skin at bedtime.   lisinopril 40 MG tablet Commonly known as:  PRINIVIL,ZESTRIL Take 40 mg by mouth daily.   methotrexate 2.5 MG tablet Commonly known as:  RHEUMATREX Take 10 mg by mouth once a week.   multivitamin with minerals Tabs tablet Take 1 tablet by mouth daily.   potassium chloride SA 20 MEQ tablet Commonly known as:  K-DUR,KLOR-CON Take 2 tablets (40 mEq total) by mouth daily. What changed:    how much to take  when to take this   PROAIR HFA 108 (90 Base) MCG/ACT inhaler Generic drug:  albuterol Inhale 1-2 puffs into the lungs every 6 (six) hours as needed for wheezing or shortness of breath.   spironolactone 25 MG tablet Commonly known as:  ALDACTONE Take 1 tablet (25 mg total) by mouth daily.   tamsulosin 0.4 MG Caps capsule Commonly known as:  FLOMAX Take 0.4 mg by mouth at bedtime.   tolterodine 4 MG 24 hr capsule Commonly known as:  DETROL LA Take 4 mg by mouth daily.   torsemide 20 MG tablet Commonly known as:  DEMADEX Take 2 tablets (40 mg total) every morning and 1 tablet (20 mg total) every evening.   triamcinolone cream 0.1 % Commonly known as:  KENALOG Apply 1 application topically daily as needed.       Disposition   The patient will be discharged in stable condition to home. Discharge Instructions    Diet - low sodium heart healthy   Complete by:  As directed    Increase activity slowly   Complete by:  As directed      Follow-up Information    [facility]. Go on 05/31/2017.   Specialty:  Cardiology Why:  1100 AM for post hospital follow up. The code for parking is 9000. Leisure centre manager thru [employer] off of [address], underground parking on your right. Can also park in lower ED lot and enter blue awning.  Contact information: [address] [id number] [place]       Nolene Ebbs, MD. Go on  06/23/2017.   Specialty:  Internal Medicine Why:  10:00am Contact information: [place] [id number] [phone]             Duration of Discharge Encounter: Greater than 35 minutes   Signed, Shirley Friar, PA-C 05/26/2017, 2:27 PM  Patient seen and examined with the above-signed Advanced Practice Provider and/or Housestaff. I personally reviewed laboratory data, imaging studies and relevant notes. I independently examined the patient and formulated the important aspects of the plan. I have edited the note to reflect any of my changes or salient points. I have personally discussed the plan with the patient and/or family.  He is improved with diuresis. BP improving. Echo with normal EF and moderate AS. Lasix switched to torsemide. Reinforced need for daily weights and reviewed use of sliding scale diuretics.  I will see him back next week to assess volume status, BP and labs.   Glori Bickers, MD  11:01 PM

## 2017-05-26 NOTE — Progress Notes (Signed)
Pt is not ready to be placed on CPAP at this time. Pt will notified RN when ready.

## 2017-05-26 NOTE — Progress Notes (Signed)
Advanced Heart Failure Rounding Note  PCP: Dr. Jeanie Cooks Primary Cardiologist: Dr Gwenlyn Found  Subjective:    Feeling better. Swelling much improved. Wants to go home today.   Creatinine relatively stable at 1.37. Negative 1.8 L and down another 6 lbs.   Echo 05/24/17 LVEF 55-60% Moderate AS. RV ok   Objective:   Weight Range: 292 lb (132.5 kg) Body mass index is 39.6 kg/m.   Vital Signs:   Temp:  [98.4 F (36.9 C)] 98.4 F (36.9 C) (01/09 0531) Pulse Rate:  [52-64] 61 (01/09 0531) Resp:  [18] 18 (01/08 2127) BP: (134-181)/(57-76) 181/67 (01/09 0531) SpO2:  [96 %-98 %] 97 % (01/09 0531) Weight:  [292 lb (132.5 kg)] 292 lb (132.5 kg) (01/09 0531) Last BM Date: 05/22/17  Weight change: Filed Weights   05/24/17 0639 05/25/17 0544 05/26/17 0531  Weight: (!) 304 lb (137.9 kg) 298 lb 3.2 oz (135.3 kg) 292 lb (132.5 kg)   Intake/Output:   Intake/Output Summary (Last 24 hours) at 05/26/2017 0744 Last data filed at 05/26/2017 0530 Gross per 24 hour  Intake 1350 ml  Output 3200 ml  Net -1850 ml     Physical Exam    General: NAD HEENT: Normal Neck: Supple. JVP difficult, appears ~ 7-8 cm. Carotids 2+ bilat; no bruits. No thyromegaly or nodule noted. Cor: PMI nondisplaced. RRR, 2/3 SEM USB, heard throughout, slightly decreased s2.  Lungs: Distant, diminished bibasilar sounds Abdomen: Obese, non-tender, ++distended, no HSM. No bruits or masses. +BS  Extremities: No cyanosis or clubbing. + Chronic venous stasis changes. Trace to 1+ edema over unna boots.  Neuro: Alert & orientedx3, cranial nerves grossly intact. moves all 4 extremities w/o difficulty. Affect pleasant    Telemetry   Sinus Brady/NSR 50-60s, personally reviewed.   EKG    No new tracings.   Labs    CBC Recent Labs    05/23/17 1155  WBC 11.2*  NEUTROABS 9.0*  HGB 10.5*  HCT 35.1*  MCV 94.6  PLT 696   Basic Metabolic Panel Recent Labs    05/24/17 0508 05/25/17 0616 05/26/17 0417  NA 140 140  139  K 3.6 3.4* 3.6  CL 104 101 100*  CO2 28 31 31   GLUCOSE 125* 116* 145*  BUN 14 15 19   CREATININE 1.15 1.24 1.37*  CALCIUM 8.8* 8.8* 8.8*  MG 2.1  --   --    Liver Function Tests Recent Labs    05/23/17 1155  AST 20  ALT 26  ALKPHOS 79  BILITOT 0.8  PROT 6.6  ALBUMIN 2.8*   No results for input(s): LIPASE, AMYLASE in the last 72 hours. Cardiac Enzymes No results for input(s): CKTOTAL, CKMB, CKMBINDEX, TROPONINI in the last 72 hours.  BNP: BNP (last 3 results) Recent Labs    04/11/17 1321 05/23/17 1155  BNP 298.7* 488.7*    ProBNP (last 3 results) No results for input(s): PROBNP in the last 8760 hours.   D-Dimer No results for input(s): DDIMER in the last 72 hours. Hemoglobin A1C No results for input(s): HGBA1C in the last 72 hours. Fasting Lipid Panel No results for input(s): CHOL, HDL, LDLCALC, TRIG, CHOLHDL, LDLDIRECT in the last 72 hours. Thyroid Function Tests No results for input(s): TSH, T4TOTAL, T3FREE, THYROIDAB in the last 72 hours.  Invalid input(s): FREET3  Other results:   Imaging    No results found.   Medications:     Scheduled Medications: . ammonium lactate   Topical Once per day on Mon  Wed Fri  . aspirin EC  81 mg Oral Daily  . atorvastatin  40 mg Oral QPM  . carvedilol  12.5 mg Oral BID WC  . Chlorhexidine Gluconate Cloth  6 each Topical Q0600  . diazepam  5 mg Oral Daily  . diclofenac  75 mg Oral BID  . diltiazem  360 mg Oral Daily  . fesoterodine  4 mg Oral Daily  . folic acid  1 mg Oral Daily  . furosemide  80 mg Intravenous BID  . glimepiride  4 mg Oral QAC breakfast  . heparin  5,000 Units Subcutaneous Q8H  . hydrALAZINE  50 mg Oral BID  . insulin aspart  0-15 Units Subcutaneous TID WC  . lisinopril  40 mg Oral Daily  . magnesium hydroxide  15 mL Oral Once  . mupirocin ointment  1 application Nasal BID  . potassium chloride SA  40 mEq Oral Daily  . sodium chloride flush  3 mL Intravenous Q12H  . spironolactone   25 mg Oral Daily  . tamsulosin  0.4 mg Oral QHS    Infusions: . sodium chloride      PRN Medications: sodium chloride, acetaminophen, albuterol, hydrALAZINE, ondansetron (ZOFRAN) IV, sodium chloride flush    Patient Profile     67 year old male with a prior history of HFpEF, hypertension, hyperlipidemia, obesity, diabetes, GERD, chronic venous stasis, moderate aortic stenosis, and reported history of paroxysmal atrial fibrillation, who presents to the emergency department with a several week history of progressive dyspnea and hypertensive urgency.    Assessment/Plan   1.  Acute on chronic diastolic congestive heart failure - Echo 05/24/17 LVEF 55-60% Moderate AS. RV ok  - Volume status continues to improve. Wants to go home today.  - Transition over to torsemide 40 mg q am and 20 mg qpm for now - Continue potassium 40 meq daily.  - HF navigator has seen for counseling. [place].   2.  Hypertensive urgency:  - Systolic pressures remain elevated into 160-180s this am.  - Continue coreg 12.5 mg BID - Continue diltiazem 360 mg daily.  - Continue spiro 25 mg daily. - Continue lisinopril 40 mg daily - Increase hydralazine to 75 mg BID. (BID with poor compliance.) - May need to consider clonidine or doxazosin - Continue prn IV hydralazine.  - Pt had Renal US in 05/2016 with medical renal disease. No mention of RAS.   3.  Chronic venous insufficiency:  - Appreciate WOC care.  - He is followed by vascular surgery and wound clinic at [facility].  No change.   4.  Obstructive sleep apnea:  - Continue CPAP qhs. No change.   5.  Anemia: - Stable this admit. No bleeding.   6.  Type 2 diabetes mellitus:  - Continue SSI while in house. No change.   7. Aortic stenosis - Moderate by echo 05/24/17. No change.   8. Deconditioning - PT has seen and recommends no follow up.   9. Non-compliance - Lives in group home in [place]. Outside of  Paramedicine range.  - Will need close follow up in HF clinic on discharge.   Will discuss optimal regimen for home with MD.  Home today with very close follow up.   Medication concerns reviewed with patient and pharmacy team. Barriers identified: Compliance  Length of Stay: 3  Shirley Friar, PA-C  05/26/2017, 7:44 AM  Advanced Heart Failure Team Pager [phone] (M-F; 7a - 4p)  Please contact [facility] for night-coverage after hours (4p -7a ) and weekends on [email or web address]   Patient seen and examined with the above-signed Advanced Practice Provider and/or Housestaff. I personally reviewed laboratory data, imaging studies and relevant notes. I independently examined the patient and formulated the important aspects of the plan. I have edited the note to reflect any of my changes or salient points. I have personally discussed the plan with the patient and/or family.  Much improved. Spencer for d/c today on above regimen. I will see him back on Monday to reassess volume status and BP control. Reinforced need for daily weights and reviewed use of sliding scale diuretics.  Glori Bickers, MD  9:16 AM

## 2017-05-26 NOTE — Progress Notes (Signed)
Orthopedic Tech Progress Note Patient Details:  Mario Mills August 27, 1950 [id number]  Ortho Devices Type of Ortho Device: Haematologist Ortho Device/Splint Location: bilateral Ortho Device/Splint Interventions: Application   Post Interventions Patient Tolerated: Well Instructions Provided: Care of device   Hildred Priest 05/26/2017, 10:37 AM

## 2017-05-26 NOTE — Plan of Care (Signed)
  Education: Knowledge of General Education information will improve 05/26/2017 0729 - Adequate for Discharge by Lavonna Rua, RN   Health Behavior/Discharge Planning: Ability to manage health-related needs will improve 05/26/2017 0729 - Adequate for Discharge by Lavonna Rua, RN   Clinical Measurements: Ability to maintain clinical measurements within normal limits will improve 05/26/2017 0729 - Adequate for Discharge by Lavonna Rua, RN Will remain free from infection 05/26/2017 0729 - Adequate for Discharge by Lavonna Rua, RN Diagnostic test results will improve 05/26/2017 0729 - Adequate for Discharge by Lavonna Rua, RN Respiratory complications will improve 05/26/2017 0729 - Adequate for Discharge by Lavonna Rua, RN Cardiovascular complication will be avoided 05/26/2017 5625 - Adequate for Discharge by Lavonna Rua, RN   Activity: Risk for activity intolerance will decrease 05/26/2017 0729 - Adequate for Discharge by Lavonna Rua, RN   Nutrition: Adequate nutrition will be maintained 05/26/2017 0729 - Adequate for Discharge by Lavonna Rua, RN   Coping: Level of anxiety will decrease 05/26/2017 0729 - Adequate for Discharge by Lavonna Rua, RN   Elimination: Will not experience complications related to bowel motility 05/26/2017 0729 - Adequate for Discharge by Lavonna Rua, RN Will not experience complications related to urinary retention 05/26/2017 0729 - Adequate for Discharge by Lavonna Rua, RN   Pain Managment: General experience of comfort will improve 05/26/2017 0729 - Adequate for Discharge by Lavonna Rua, RN   Safety: Ability to remain free from injury will improve 05/26/2017 0729 - Adequate for Discharge by Lavonna Rua, RN   Skin Integrity: Risk for impaired skin integrity will decrease 05/26/2017 0729 - Adequate for Discharge by Lavonna Rua, RN   Education: Ability to demonstrate management of disease process will improve 05/26/2017 0729 -  Adequate for Discharge by Lavonna Rua, RN Ability to verbalize understanding of medication therapies will improve 05/26/2017 0729 - Adequate for Discharge by Lavonna Rua, RN   Activity: Capacity to carry out activities will improve 05/26/2017 0729 - Adequate for Discharge by Lavonna Rua, RN   Cardiac: Ability to achieve and maintain adequate cardiopulmonary perfusion will improve 05/26/2017 0729 - Adequate for Discharge by Lavonna Rua, RN

## 2017-05-26 NOTE — Progress Notes (Signed)
Patient will discharge to home today. Mario Mills will provide transport home. All discharge instructions, meds, rx and follow up appt reviewed with pt. Stressed need to comply with meds and follow up appts, exercise and diet.

## 2017-05-26 NOTE — Progress Notes (Signed)
I stopped in to speak with Mario Mills again.  I reinforced the need for compliance with his medications specifically the Torsemide.  He denies any issues getting the new medications.  He acknowledges understanding and says that he will "work on it".  He travels back and forth from his group home in [place] to his home in [place] stays quite busy. He also denies any issues with transportation to appointments.  He has an appointment scheduled with [facility] on January 14th.

## 2017-05-31 ENCOUNTER — Ambulatory Visit (HOSPITAL_COMMUNITY)
Admission: RE | Admit: 2017-05-31 | Discharge: 2017-05-31 | Disposition: A | Discharge status: Home / Self Care | Payer: Medicare Other | Source: Ambulatory Visit | Attending: Internal Medicine | Admitting: Internal Medicine

## 2017-05-31 VITALS — BP 118/50 | HR 72 | Wt 299.0 lb

## 2017-05-31 DIAGNOSIS — E785 Hyperlipidemia, unspecified: Secondary | ICD-10-CM | POA: Diagnosis not present

## 2017-05-31 DIAGNOSIS — Z7982 Long term (current) use of aspirin: Secondary | ICD-10-CM | POA: Diagnosis not present

## 2017-05-31 DIAGNOSIS — L97929 Non-pressure chronic ulcer of unspecified part of left lower leg with unspecified severity: Secondary | ICD-10-CM

## 2017-05-31 DIAGNOSIS — E669 Obesity, unspecified: Secondary | ICD-10-CM | POA: Diagnosis not present

## 2017-05-31 DIAGNOSIS — I5032 Chronic diastolic (congestive) heart failure: Secondary | ICD-10-CM | POA: Diagnosis present

## 2017-05-31 DIAGNOSIS — I83019 Varicose veins of right lower extremity with ulcer of unspecified site: Secondary | ICD-10-CM | POA: Diagnosis not present

## 2017-05-31 DIAGNOSIS — Z794 Long term (current) use of insulin: Secondary | ICD-10-CM | POA: Insufficient documentation

## 2017-05-31 DIAGNOSIS — Z79899 Other long term (current) drug therapy: Secondary | ICD-10-CM | POA: Insufficient documentation

## 2017-05-31 DIAGNOSIS — Z9114 Patient's other noncompliance with medication regimen: Secondary | ICD-10-CM | POA: Insufficient documentation

## 2017-05-31 DIAGNOSIS — Z9119 Patient's noncompliance with other medical treatment and regimen: Secondary | ICD-10-CM | POA: Insufficient documentation

## 2017-05-31 DIAGNOSIS — G4733 Obstructive sleep apnea (adult) (pediatric): Secondary | ICD-10-CM

## 2017-05-31 DIAGNOSIS — I350 Nonrheumatic aortic (valve) stenosis: Secondary | ICD-10-CM

## 2017-05-31 DIAGNOSIS — I10 Essential (primary) hypertension: Secondary | ICD-10-CM

## 2017-05-31 DIAGNOSIS — E1151 Type 2 diabetes mellitus with diabetic peripheral angiopathy without gangrene: Secondary | ICD-10-CM | POA: Insufficient documentation

## 2017-05-31 DIAGNOSIS — L97919 Non-pressure chronic ulcer of unspecified part of right lower leg with unspecified severity: Secondary | ICD-10-CM | POA: Diagnosis not present

## 2017-05-31 DIAGNOSIS — Z87891 Personal history of nicotine dependence: Secondary | ICD-10-CM | POA: Insufficient documentation

## 2017-05-31 DIAGNOSIS — I83029 Varicose veins of left lower extremity with ulcer of unspecified site: Secondary | ICD-10-CM

## 2017-05-31 DIAGNOSIS — I110 Hypertensive heart disease with heart failure: Secondary | ICD-10-CM | POA: Diagnosis not present

## 2017-05-31 DIAGNOSIS — I872 Venous insufficiency (chronic) (peripheral): Secondary | ICD-10-CM | POA: Insufficient documentation

## 2017-05-31 LAB — BASIC METABOLIC PANEL
ANION GAP: 8 (ref 5–15)
BUN: 23 mg/dL — ABNORMAL HIGH (ref 6–20)
CHLORIDE: 107 mmol/L (ref 101–111)
CO2: 25 mmol/L (ref 22–32)
CREATININE: 1.6 mg/dL — AB (ref 0.61–1.24)
Calcium: 8.7 mg/dL — ABNORMAL LOW (ref 8.9–10.3)
GFR calc non Af Amer: 43 mL/min — ABNORMAL LOW (ref >60)
GFR, EST AFRICAN AMERICAN: 49 mL/min — AB (ref >60)
Glucose, Bld: 135 mg/dL — ABNORMAL HIGH (ref 65–99)
POTASSIUM: 4.5 mmol/L (ref 3.5–5.1)
SODIUM: 140 mmol/L (ref 135–145)

## 2017-05-31 MED ORDER — SPIRONOLACTONE 25 MG PO TABS: 25.0000 mg | ORAL_TABLET | Freq: Every day | ORAL | 6 refills | Status: DC

## 2017-05-31 MED ORDER — TORSEMIDE 20 MG PO TABS: 40.0000 mg | ORAL_TABLET | Freq: Two times a day (BID) | ORAL | 6 refills | Status: DC

## 2017-05-31 NOTE — Patient Instructions (Signed)
Increase Torsemide to 40 mg (2 tabs) Twice daily   Start Spironolactone 25 mg daily, if you haven't already  Labs today  Your physician recommends that you weigh, daily, at the same time every day, and in the same amount of clothing. Please record your daily weights on the handout provided and bring it to your next appointment.  IF YOUR WEIGHT IS 301 LB OR MORE CALL OUR OFFICE.  You have been referred to Paramedic Program, they will call you and arrange a time to meet  Your physician recommends that you schedule a follow-up appointment in: 2 weeks

## 2017-05-31 NOTE — Progress Notes (Signed)
ADVANCED HF CLINIC NOTE   HPI:  MARJORIE LUSSIER is a 67 y.o. male with a prior history of HFpEF, hypertension, hyperlipidemia, obesity,OSA,  diabetes, GERD, chronic venous stasis (followed in [facility] lymphedema clinic), moderate aortic stenosis, psoriasis and questionable h/o paroxysmal atrial fibrillation.  Admitted to [facility] on 05/23/2017 with progressive SOB. Found to have marked volume overload despite adjustment of outpatient diuretics. Pt also noted to have hypertensive urgency with systolic BP > 643 on arrival. There was some question about compliance at home.  Overall pt diuresed 28 lbs down.    Echo EF 60-65% Normal RV  Moderate AS mean gradient 62mmH  Discharged on 1/9. Weight on d/c 292. Discharged on torsemide 40/20. No power for 2 days so eating out a lot. Weight up to 299. Has not been weighing recently. + worsening edema. Had a fall on his cane the other day and then fell to floor. Was carrying bags in both hands and tripped. Didn't hit his head. Gets SOB on mild to moderate exertion. Denies orthopnea or PND.     ROS: All systems negative except as listed in HPI, PMH and Problem List.  SH:  Social History   Socioeconomic History  . Marital status: Married    Spouse name: Not on file  . Number of children: 2  . Years of education: post grad  . Highest education level: Not on file  Social Needs  . Financial resource strain: Not on file  . Food insecurity - worry: Not on file  . Food insecurity - inability: Not on file  . Transportation needs - medical: Not on file  . Transportation needs - non-medical: Not on file  Occupational History  . 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 Smoking status: Former Smoker    Packs/day: 1.00    Years: 35.00    Pack years: 35.00    Types: Cigarettes    Last attempt to quit: 07/25/2005    Years since quitting: 11.8  . Smokeless tobacco: Never Used  Substance and Sexual Activity    . Alcohol use: Yes    Alcohol/week: 0.0 oz    Types: 1 Standard drinks or equivalent per week    Comment: rarely   . Drug use: No  . Sexual activity: Not on file  Other Topics Concern  . Not on file  Social History Narrative  . Not on file    FH:  Family History  Problem Relation Age of Onset  . Pancreatic cancer Father   . Cancer Father   . Heart Problems Mother   . Hypertension Mother   . Diabetes Brother   . Hypertension Brother   . Colon cancer Neg Hx     Past Medical History:  Diagnosis Date  . Arthritis   . Chronic diastolic heart failure ([place])    a. 10/2016 Echo: EF 55-60%, gr2 DD, triv AI, mod dil LA, mildly dil RV/RA.  [name] Chronic low back pain with sciatica   . Constipation   . DM (diabetes mellitus) type II controlled peripheral vascular disorder   . GERD (gastroesophageal reflux disease)   . History of stress test    a. 04/2015 MV: EF 47%, mild basal inflat and mid inflat defect c/w ischemia. EF nl by echo @ that time and no further isch w/u pursued.  . Hyperlipidemia   . Hypertension   . Hypokalemia   . Microcytic anemia   . Moderate aortic stenosis  a. 10/2016 Echo: Mod AS.  . Numbness and tingling    feet bilat   . Obstructive sleep apnea    a. compliant w/ CPAP.  [name] Peripheral arterial disease ([place])   . Primary osteoarthritis of left knee   . Urinary incontinence   . Venous insufficiency    a. chronic LEE-->wound mgmt/wraps  . Venous stasis ulcer ([place])     Current Outpatient Medications  Medication Sig Dispense Refill  . aspirin EC 81 MG EC tablet Take 1 tablet (81 mg total) by mouth daily. 30 tablet 6  . atorvastatin (LIPITOR) 40 MG tablet Take 40 mg by mouth every evening.  2  . carvedilol (COREG) 12.5 MG tablet Take 12.5 mg by mouth 2 (two) times daily with a meal.    . diazepam (VALIUM) 5 MG tablet Take 5 mg by mouth daily.   2  . diclofenac (VOLTAREN) 75 MG EC tablet Take 75 mg by mouth 2 (two) times daily.    [name] diltiazem (CARDIZEM CD)  360 MG 24 hr capsule Take 1 capsule (360 mg total) by mouth daily. 30 capsule 0  . folic acid (FOLVITE) 1 MG tablet Take 1 mg by mouth daily.    [name] glimepiride (AMARYL) 4 MG tablet Take 4 mg by mouth daily before breakfast. Reported on 09/24/2015    . hydrALAZINE (APRESOLINE) 25 MG tablet Take 3 tablets (75 mg total) by mouth 2 (two) times daily. 180 tablet 6  . hydrOXYzine (ATARAX/VISTARIL) 25 MG tablet Take 25 mg by mouth 2 (two) times daily as needed for itching.  2  . LEVEMIR FLEXTOUCH 100 UNIT/ML Pen Inject 20 Units into the skin at bedtime.  5  . lisinopril (PRINIVIL,ZESTRIL) 40 MG tablet Take 40 mg by mouth daily.  2  . methotrexate (RHEUMATREX) 2.5 MG tablet Take 10 mg by mouth once a week.    . Multiple Vitamin (MULTIVITAMIN WITH MINERALS) TABS tablet Take 1 tablet by mouth daily.    . potassium chloride SA (K-DUR,KLOR-CON) 20 MEQ tablet Take 2 tablets (40 mEq total) by mouth daily. 60 tablet 3  . PROAIR HFA 108 (90 Base) MCG/ACT inhaler Inhale 1-2 puffs into the lungs every 6 (six) hours as needed for wheezing or shortness of breath.    . spironolactone (ALDACTONE) 25 MG tablet Take 1 tablet (25 mg total) by mouth daily. 30 tablet 6  . tamsulosin (FLOMAX) 0.4 MG CAPS capsule Take 0.4 mg by mouth at bedtime.  5  . tolterodine (DETROL LA) 4 MG 24 hr capsule Take 4 mg by mouth daily.     [name] torsemide (DEMADEX) 20 MG tablet Take 2 tablets (40 mg total) every morning and 1 tablet (20 mg total) every evening. 100 tablet 6  . triamcinolone cream (KENALOG) 0.1 % Apply 1 application topically daily as needed.  1   No current facility-administered medications for this encounter.     Vitals:   05/31/17 1117  BP: (!) 118/50  Pulse: 72  SpO2: 97%  Weight: 299 lb (135.6 kg)    PHYSICAL EXAM:  General:  Obese male SOB with mild exertion  HEENT: normal Neck: supple. JVP hard to see looks 9-10. Carotids 2+ bilaterally; no bruits. No lymphadenopathy or thryomegaly appreciated. Cor: PMI normal.  Regular rate & rhythm. 2/6 AS with mildly reduced s2 Lungs: clear Abdomen: obese  soft, nontender, nondistended. No hepatosplenomegaly. No bruits or masses. Good bowel sounds. Extremities: no cyanosis, clubbing, rash, 2+ edema with compression hose Neuro: alert & orientedx3, cranial  nerves grossly intact. Moves all 4 extremities w/o difficulty. Affect pleasant.   ASSESSMENT & PLAN: 1. Chronic diastolic congestive heart failure - Echo 05/24/17 LVEF 60-65% Moderate AS. RV ok  - Volume status climbing again after recent 28 pound inpatient diuresis - NYHA II-III - Continues to struggle greatly with compliance  - Increase torsemide to 40 bid.Reinforced need for daily weights and reviewed use of sliding scale diuretics. - Has not stared spiro yet. Will start.  - Continue potassium 40 meq daily.  - Will try again to enroll in Paramedicine but may not live in their jurisdiction.    2. Aortic stenosis - Moderate by echo 05/24/17 mean gradient 82mmHg. Continue to follow - Repeat echo 6 months .   3. Hypertension, severe - Much improved.  - Continue coreg 12.5 mg BID - Continue diltiazem 360 mg daily.  - Continue spiro 25 mg daily. - Continue lisinopril 40 mg daily - Continue hydralazine to 75 mg BID. (BID with poor compliance.) - Pt had Renal US in 05/2016 with medical renal disease. No mention of RAS.   4. Chronic venous insufficiency:  - He is followed by lymphedema clinic as well as vascular surgery and wound clinic at [facility]. Has f/u later this week. Continue wraps.   5. Obstructive sleep apnea:  - Continue CPAP qhs. No change.   6. Type 2 diabetes mellitus:  - Consider Jardiance  7. Non-compliance - Stressed need for better compliance with meds and diet. Will see if we can get Paramedicine to him .  8. ? PAF - not confirmed. If we can get confirmation will need AC.   9. PAD - followed at [facility]. Has bilateral LE disease - continue ASA, statin - consider w/u for  CAD  Total time spent 45 minutes. Over half that time spent discussing above.   Glori Bickers, MD  10:22 PM

## 2017-06-01 ENCOUNTER — Telehealth (HOSPITAL_COMMUNITY): Payer: Self-pay | Admitting: *Deleted

## 2017-06-01 DIAGNOSIS — I5032 Chronic diastolic (congestive) heart failure: Secondary | ICD-10-CM

## 2017-06-01 NOTE — Telephone Encounter (Signed)
-----   Message from Jolaine Artist, MD sent at 05/31/2017 11:15 PM EST ----- Repeat BMET 1 week.

## 2017-06-01 NOTE — Telephone Encounter (Signed)
Notes recorded by Scarlette Calico, RN on 06/01/2017 at 4:39 PM EST Pt aware, bmet sch for 1/22

## 2017-06-02 ENCOUNTER — Telehealth (HOSPITAL_COMMUNITY): Payer: Self-pay | Admitting: Surgery

## 2017-06-02 NOTE — Telephone Encounter (Signed)
I called Mario Mills regarding enrollment into HF Community Paramedicine Program.  He agrees to arrange appointments with [facility] at his home in [place].  I have sent all appropriate paperwork via secure email to the paramedic team.

## 2017-06-04 ENCOUNTER — Ambulatory Visit: Payer: Medicare Other | Admitting: Cardiology

## 2017-06-07 ENCOUNTER — Encounter (HOSPITAL_COMMUNITY): Payer: Medicare Other

## 2017-06-08 ENCOUNTER — Other Ambulatory Visit (HOSPITAL_COMMUNITY): Payer: Self-pay

## 2017-06-08 ENCOUNTER — Telehealth (HOSPITAL_COMMUNITY): Payer: Self-pay

## 2017-06-08 ENCOUNTER — Ambulatory Visit (HOSPITAL_COMMUNITY)
Admission: RE | Admit: 2017-06-08 | Discharge: 2017-06-08 | Disposition: A | Discharge status: Home / Self Care | Payer: Medicare Other | Source: Ambulatory Visit | Attending: Cardiology | Admitting: Cardiology

## 2017-06-08 DIAGNOSIS — I5032 Chronic diastolic (congestive) heart failure: Secondary | ICD-10-CM | POA: Diagnosis not present

## 2017-06-08 LAB — BASIC METABOLIC PANEL
ANION GAP: 14 (ref 5–15)
BUN: 33 mg/dL — ABNORMAL HIGH (ref 6–20)
CALCIUM: 9.1 mg/dL (ref 8.9–10.3)
CHLORIDE: 99 mmol/L — AB (ref 101–111)
CO2: 25 mmol/L (ref 22–32)
CREATININE: 1.76 mg/dL — AB (ref 0.61–1.24)
GFR calc Af Amer: 44 mL/min — ABNORMAL LOW (ref >60)
GFR calc non Af Amer: 38 mL/min — ABNORMAL LOW (ref >60)
GLUCOSE: 139 mg/dL — AB (ref 65–99)
Potassium: 4.8 mmol/L (ref 3.5–5.1)
Sodium: 138 mmol/L (ref 135–145)

## 2017-06-08 NOTE — Progress Notes (Signed)
Paramedicine Encounter    Patient ID: Mario Mills, male    DOB: October 23, 1950, 67 y.o.   MRN: [id number]   Patient Care Team: Nolene Ebbs, MD as PCP - General (Internal Medicine) Lorretta Harp, MD as PCP - Cardiology (Cardiology)  Patient Active Problem List   Diagnosis Date Noted  . Acute on chronic diastolic CHF (congestive heart failure) ([place]) 05/23/2017  . Acute exacerbation of CHF (congestive heart failure) ([place]) 04/11/2017  . Non-compliance 04/11/2017  . AKI (acute kidney injury) ([place]) 04/11/2017  . Aortic stenosis 06/01/2016  . Chronic diastolic CHF (congestive heart failure), NYHA class 2 ([place]) 06/26/2015  . Paroxysmal atrial fibrillation (H[facility])   . Chronic anticoagulation   . Acute on chronic diastolic (congestive) heart failure ([place])   . Aspiration pneumonitis ([place]) 06/06/2015  . Pulmonary vascular congestion 06/02/2015  . Postoperative anemia due to acute blood loss 06/02/2015  . Hypertensive urgency 05/31/2015  . Acute respiratory failure with hypoxia ([place]) 05/31/2015  . S/P left knee arthroscopy 05/31/2015  . DM (diabetes mellitus) type II, controlled, with peripheral vascular disorder ([place]) 05/31/2015  . Morbid obesity due to excess calories ([place]) 05/31/2015  . Bradycardia with 41 - 50 beats per minute 05/31/2015  . Sepsis, unspecified organism ([place]) 05/31/2015  . Venous stasis ulcers of both lower extremities ([place]) 03/28/2014  . Essential hypertension 12/22/2012  . Obstructive sleep apnea 12/22/2012    Current Outpatient Medications:  .  aspirin EC 81 MG EC tablet, Take 1 tablet (81 mg total) by mouth daily., Disp: 30 tablet, Rfl: 6 .  atorvastatin (LIPITOR) 40 MG tablet, Take 40 mg by mouth every evening., Disp: , Rfl: 2 .  carvedilol (COREG) 12.5 MG tablet, Take 12.5 mg by mouth 2 (two) times daily with a meal., Disp: , Rfl:  .  diazepam (VALIUM) 5 MG tablet, Take 5 mg by mouth daily. , Disp: , Rfl: 2 .  diclofenac (VOLTAREN) 75 MG EC tablet, Take 75 mg  by mouth 2 (two) times daily., Disp: , Rfl:  .  diltiazem (CARDIZEM CD) 360 MG 24 hr capsule, Take 1 capsule (360 mg total) by mouth daily., Disp: 30 capsule, Rfl: 0 .  folic acid (FOLVITE) 1 MG tablet, Take 1 mg by mouth daily., Disp: , Rfl:  .  glimepiride (AMARYL) 4 MG tablet, Take 4 mg by mouth daily before breakfast. Reported on 09/24/2015, Disp: , Rfl:  .  hydrALAZINE (APRESOLINE) 25 MG tablet, Take 3 tablets (75 mg total) by mouth 2 (two) times daily., Disp: 180 tablet, Rfl: 6 .  hydrOXYzine (ATARAX/VISTARIL) 25 MG tablet, Take 25 mg by mouth 2 (two) times daily as needed for itching., Disp: , Rfl: 2 .  LEVEMIR FLEXTOUCH 100 UNIT/ML Pen, Inject 20 Units into the skin at bedtime., Disp: , Rfl: 5 .  lisinopril (PRINIVIL,ZESTRIL) 40 MG tablet, Take 40 mg by mouth daily., Disp: , Rfl: 2 .  methotrexate (RHEUMATREX) 2.5 MG tablet, Take 10 mg by mouth once a week., Disp: , Rfl:  .  Multiple Vitamin (MULTIVITAMIN WITH MINERALS) TABS tablet, Take 1 tablet by mouth daily., Disp: , Rfl:  .  potassium chloride SA (K-DUR,KLOR-CON) 20 MEQ tablet, Take 2 tablets (40 mEq total) by mouth daily., Disp: 60 tablet, Rfl: 3 .  PROAIR HFA 108 (90 Base) MCG/ACT inhaler, Inhale 1-2 puffs into the lungs every 6 (six) hours as needed for wheezing or shortness of breath., Disp: , Rfl:  .  spironolactone (ALDACTONE) 25 MG tablet, Take 1 tablet (25 mg  total) by mouth daily., Disp: 30 tablet, Rfl: 6 .  tamsulosin (FLOMAX) 0.4 MG CAPS capsule, Take 0.4 mg by mouth at bedtime., Disp: , Rfl: 5 .  tolterodine (DETROL LA) 4 MG 24 hr capsule, Take 4 mg by mouth daily. , Disp: , Rfl:  .  torsemide (DEMADEX) 20 MG tablet, Take 2 tablets (40 mg total) by mouth 2 (two) times daily., Disp: 120 tablet, Rfl: 6 .  triamcinolone cream (KENALOG) 0.1 %, Apply 1 application topically daily as needed., Disp: , Rfl: 1 No Known Allergies    Social History   Socioeconomic History  . Marital status: Married    Spouse name: Not on file  .  Number of children: 2  . Years of education: post grad  . Highest education level: Not on file  Social Needs  . Financial resource strain: Not on file  . Food insecurity - worry: Not on file  . Food insecurity - inability: Not on file  . Transportation needs - medical: Not on file  . Transportation needs - non-medical: Not on file  Occupational History  . 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 Smoking status: Former Smoker    Packs/day: 1.00    Years: 35.00    Pack years: 35.00    Types: Cigarettes    Last attempt to quit: 07/25/2005    Years since quitting: 11.8  . Smokeless tobacco: Never Used  Substance and Sexual Activity  . Alcohol use: Yes    Alcohol/week: 0.0 oz    Types: 1 Standard drinks or equivalent per week    Comment: rarely   . Drug use: No  . Sexual activity: Not on file  Other Topics Concern  . Not on file  Social History Narrative  . Not on file    Physical Exam      Future Appointments  Date Time Provider [place]  06/15/2017 12:00 PM [facility] PA/NP [facility] None  06/16/2017  1:30 PM Erlene Quan, PA-C [facility]  06/30/2017  2:00 PM Gardiner Barefoot, DPM [facility]  07/20/2017 11:15 AM Lorretta Harp, MD [facility]    There were no vitals taken for this visit.  Morning weight- 287.4 lb   Mr Corales was seen at the clinic today and reported feeling well. This was our first meeting so most of the time was spent getting acquainted. He brought his medications and pillbox which he had already filled. He if currently out of metformin and glimepiride. He contacted the pharmacy to see if he had refills but was unsure which pharmacy had these prescriptions. He will be at the clinic again next week for a visit with the provider and we will revisit that medication issue.   Time spent with patient: 32 minutes  Jacquiline Doe, EMT 06/08/17  ACTION: Next visit planned  for 1 week

## 2017-06-08 NOTE — Telephone Encounter (Signed)
I called Mario Mills to schedule an appointment. He stated he was actually on his way to an appointment at the [facility]. I requested he bring his medications and pillbox for [place] to go over. He agreed and said he would see me there.

## 2017-06-15 ENCOUNTER — Ambulatory Visit (HOSPITAL_COMMUNITY)
Admission: RE | Admit: 2017-06-15 | Discharge: 2017-06-15 | Disposition: A | Discharge status: Home / Self Care | Payer: Medicare Other | Source: Ambulatory Visit | Attending: Cardiology | Admitting: Cardiology

## 2017-06-15 ENCOUNTER — Other Ambulatory Visit (HOSPITAL_COMMUNITY): Payer: Self-pay

## 2017-06-15 VITALS — BP 142/60 | HR 69 | Wt 294.4 lb

## 2017-06-15 DIAGNOSIS — N183 Chronic kidney disease, stage 3 (moderate): Secondary | ICD-10-CM | POA: Insufficient documentation

## 2017-06-15 DIAGNOSIS — Z794 Long term (current) use of insulin: Secondary | ICD-10-CM | POA: Diagnosis not present

## 2017-06-15 DIAGNOSIS — I350 Nonrheumatic aortic (valve) stenosis: Secondary | ICD-10-CM | POA: Diagnosis not present

## 2017-06-15 DIAGNOSIS — E1151 Type 2 diabetes mellitus with diabetic peripheral angiopathy without gangrene: Secondary | ICD-10-CM | POA: Diagnosis not present

## 2017-06-15 DIAGNOSIS — Z87891 Personal history of nicotine dependence: Secondary | ICD-10-CM | POA: Insufficient documentation

## 2017-06-15 DIAGNOSIS — Z79899 Other long term (current) drug therapy: Secondary | ICD-10-CM | POA: Insufficient documentation

## 2017-06-15 DIAGNOSIS — Z7982 Long term (current) use of aspirin: Secondary | ICD-10-CM | POA: Diagnosis not present

## 2017-06-15 DIAGNOSIS — I5032 Chronic diastolic (congestive) heart failure: Secondary | ICD-10-CM | POA: Diagnosis not present

## 2017-06-15 DIAGNOSIS — I872 Venous insufficiency (chronic) (peripheral): Secondary | ICD-10-CM | POA: Diagnosis not present

## 2017-06-15 DIAGNOSIS — G4733 Obstructive sleep apnea (adult) (pediatric): Secondary | ICD-10-CM | POA: Insufficient documentation

## 2017-06-15 DIAGNOSIS — I480 Paroxysmal atrial fibrillation: Secondary | ICD-10-CM | POA: Diagnosis not present

## 2017-06-15 DIAGNOSIS — I10 Essential (primary) hypertension: Secondary | ICD-10-CM | POA: Diagnosis not present

## 2017-06-15 DIAGNOSIS — E1122 Type 2 diabetes mellitus with diabetic chronic kidney disease: Secondary | ICD-10-CM | POA: Diagnosis not present

## 2017-06-15 DIAGNOSIS — I130 Hypertensive heart and chronic kidney disease with heart failure and stage 1 through stage 4 chronic kidney disease, or unspecified chronic kidney disease: Secondary | ICD-10-CM | POA: Insufficient documentation

## 2017-06-15 LAB — BASIC METABOLIC PANEL
ANION GAP: 10 (ref 5–15)
BUN: 45 mg/dL — ABNORMAL HIGH (ref 6–20)
CHLORIDE: 102 mmol/L (ref 101–111)
CO2: 25 mmol/L (ref 22–32)
Calcium: 9 mg/dL (ref 8.9–10.3)
Creatinine, Ser: 1.74 mg/dL — ABNORMAL HIGH (ref 0.61–1.24)
GFR calc Af Amer: 45 mL/min — ABNORMAL LOW (ref >60)
GFR calc non Af Amer: 39 mL/min — ABNORMAL LOW (ref >60)
GLUCOSE: 158 mg/dL — AB (ref 65–99)
POTASSIUM: 5.3 mmol/L — AB (ref 3.5–5.1)
Sodium: 137 mmol/L (ref 135–145)

## 2017-06-15 NOTE — Progress Notes (Signed)
ADVANCED HF CLINIC NOTE  HF MD: Dr Haroldine Laws PCP: Dr Hans Eden   HPI: Mario Mills is a 67 y.o. male with a prior history of HFpEF, hypertension, hyperlipidemia, obesity,OSA,  diabetes, GERD, chronic venous stasis (followed in [facility] lymphedema clinic), moderate aortic stenosis, psoriasis and questionable h/o paroxysmal atrial fibrillation.  Admitted to [facility] on 05/23/2017 with progressive SOB. Found to have marked volume overload despite adjustment of outpatient diuretics. Pt also noted to have hypertensive urgency with systolic BP > 250 on arrival. There was some question about compliance at home.  Overall pt diuresed 28 lbs down.  Discharged on 1/9. Weight on d/c 292. Discharged on torsemide 40/20.   Today he returns for HF follow up. Last visit torsemide was increased to 40 mg twice a day. He was referred to Paramedicine. Overall feeling fine. Denies SOB/PND/Orthopnea. Appetite ok. No fever or chills. Has not been following low salt diet. Weight at home has been going down 292>286 pounds. Using lymph edema machine 3-4 times a week. Using CPAP nightly.   Taking all medications  Echo EF 60-65% Normal RV  Moderate AS mean gradient 34 mm hg  ROS: All systems negative except as listed in HPI, PMH and Problem List.  SH:  Social History   Socioeconomic History  . Marital status: Married    Spouse name: Not on file  . Number of children: 2  . Years of education: post grad  . Highest education level: Not on file  Social Needs  . Financial resource strain: Not on file  . Food insecurity - worry: Not on file  . Food insecurity - inability: Not on file  . Transportation needs - medical: Not on file  . Transportation needs - non-medical: Not on file  Occupational History  . 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 Smoking status: Former Smoker    Packs/day: 1.00    Years: 35.00    Pack years: 35.00    Types: Cigarettes    Last attempt to  quit: 07/25/2005    Years since quitting: 11.8  . Smokeless tobacco: Never Used  Substance and Sexual Activity  . Alcohol use: Yes    Alcohol/week: 0.0 oz    Types: 1 Standard drinks or equivalent per week    Comment: rarely   . Drug use: No  . Sexual activity: Not on file  Other Topics Concern  . Not on file  Social History Narrative  . Not on file    FH:  Family History  Problem Relation Age of Onset  . Pancreatic cancer Father   . Cancer Father   . Heart Problems Mother   . Hypertension Mother   . Diabetes Brother   . Hypertension Brother   . Colon cancer Neg Hx     Past Medical History:  Diagnosis Date  . Arthritis   . Chronic diastolic heart failure ([place])    a. 10/2016 Echo: EF 55-60%, gr2 DD, triv AI, mod dil LA, mildly dil RV/RA.  [name] Chronic low back pain with sciatica   . Constipation   . DM (diabetes mellitus) type II controlled peripheral vascular disorder   . GERD (gastroesophageal reflux disease)   . History of stress test    a. 04/2015 MV: EF 47%, mild basal inflat and mid inflat defect c/w ischemia. EF nl by echo @ that time and no further isch w/u pursued.  . Hyperlipidemia   . Hypertension   .  Hypokalemia   . Microcytic anemia   . Moderate aortic stenosis    a. 10/2016 Echo: Mod AS.  . Numbness and tingling    feet bilat   . Obstructive sleep apnea    a. compliant w/ CPAP.  [name] Peripheral arterial disease ([place])   . Primary osteoarthritis of left knee   . Urinary incontinence   . Venous insufficiency    a. chronic LEE-->wound mgmt/wraps  . Venous stasis ulcer ([place])     Current Outpatient Medications  Medication Sig Dispense Refill  . Aspirin-Caffeine (BAYER BACK & BODY) 500-32.5 MG TABS Take 1 tablet by mouth daily.    [name] atorvastatin (LIPITOR) 40 MG tablet Take 40 mg by mouth every evening.  2  . carvedilol (COREG) 12.5 MG tablet Take 12.5 mg by mouth 2 (two) times daily with a meal.    . diazepam (VALIUM) 5 MG tablet Take 5 mg by mouth daily.    2  . diclofenac (VOLTAREN) 75 MG EC tablet Take 75 mg by mouth 2 (two) times daily.    [name] diltiazem (CARDIZEM CD) 360 MG 24 hr capsule Take 1 capsule (360 mg total) by mouth daily. 30 capsule 0  . folic acid (FOLVITE) 1 MG tablet Take 1 mg by mouth daily.    . hydrALAZINE (APRESOLINE) 25 MG tablet Take 3 tablets (75 mg total) by mouth 2 (two) times daily. 180 tablet 6  . HYDROmorphone (DILAUDID) 4 MG tablet Take 4 mg by mouth every 6 (six) hours as needed for severe pain.    . hydrOXYzine (ATARAX/VISTARIL) 25 MG tablet Take 25 mg by mouth 2 (two) times daily as needed for itching.  2  . LEVEMIR FLEXTOUCH 100 UNIT/ML Pen Inject 20 Units into the skin at bedtime.  5  . lisinopril (PRINIVIL,ZESTRIL) 40 MG tablet Take 40 mg by mouth daily.  2  . metFORMIN (GLUCOPHAGE) 1000 MG tablet Take 1,000 mg by mouth 2 (two) times daily with a meal.    . methotrexate (RHEUMATREX) 2.5 MG tablet Take 10 mg by mouth once a week.    . Multiple Vitamin (MULTIVITAMIN WITH MINERALS) TABS tablet Take 1 tablet by mouth daily.    . potassium chloride SA (K-DUR,KLOR-CON) 20 MEQ tablet Take 2 tablets (40 mEq total) by mouth daily. 60 tablet 3  . PROAIR HFA 108 (90 Base) MCG/ACT inhaler Inhale 1-2 puffs into the lungs every 6 (six) hours as needed for wheezing or shortness of breath.    . spironolactone (ALDACTONE) 25 MG tablet Take 1 tablet (25 mg total) by mouth daily. 30 tablet 6  . tamsulosin (FLOMAX) 0.4 MG CAPS capsule Take 0.4 mg by mouth at bedtime.  5  . tolterodine (DETROL LA) 4 MG 24 hr capsule Take 4 mg by mouth daily.     [name] torsemide (DEMADEX) 20 MG tablet Take 2 tablets (40 mg total) by mouth 2 (two) times daily. 120 tablet 6  . triamcinolone cream (KENALOG) 0.1 % Apply 1 application topically daily as needed.  1  . glimepiride (AMARYL) 4 MG tablet Take 4 mg by mouth daily before breakfast. Reported on 09/24/2015     No current facility-administered medications for this encounter.     Vitals:   06/15/17 1205    BP: (!) 142/60  Pulse: 69  SpO2: 97%  Weight: 294 lb 6 oz (133.5 kg)   Filed Weights   06/15/17 1205  Weight: 294 lb 6 oz (133.5 kg)   PHYSICAL EXAM: General:  Well appearing.  No resp difficulty. Ambulated in the clinic with cane.  HEENT: normal Neck: supple. JVP 7-8. Carotids 2+ bilat; no bruits. No lymphadenopathy or thryomegaly appreciated. Cor: PMI nondisplaced. Regular rate & rhythm. No rubs, gallops. 2/6 AS. Lungs: clear Abdomen: obese, soft, nontender, nondistended. No hepatosplenomegaly. No bruits or masses. Good bowel sounds. Extremities: no cyanosis, clubbing, rash, R and LLE compression wraps. Neuro: alert & orientedx3, cranial nerves grossly intact. moves all 4 extremities w/o difficulty. Affect pleasant   ASSESSMENT & PLAN: 1. Chronic diastolic congestive heart failure - Echo 05/24/17 LVEF 60-65% Moderate AS. RV ok  - NYHA II-III -Volume status stable.  - Continue torsemide 40 mg twice a day +  potassium 40 meq daily.  -Check BMET  - 2. Aortic stenosis - Moderate by echo 05/24/17 mean gradient 84mmHg. Continue to follow - Repeat echo 6 months  --> July 2019  3. Hypertension, severe - Stable. Continue current regimen.  - Continue coreg 12.5 mg BID - Continue diltiazem 360 mg daily.  - Continue spiro 25 mg daily. - Continue lisinopril 40 mg daily - Continue hydralazine to 75 mg BID. (BID with poor compliance.) - Pt had Renal US in 05/2016 with medical renal disease. No mention of RAS.   4. Chronic venous insufficiency:  - He is followed by lymphedema clinic as well as vascular surgery and wound clinic at [place] f/u later this week.  -Continue wraps.   5. Obstructive sleep apnea:  Continue nightly CPAP.  - Continue CPAP qhs. No change.   6. Type 2 diabetes mellitus:  - Consider Jardiance  7. ? PAF - not confirmed.  If we can get confirmation will need AC.   8. PAD - followed at [facility]. Has bilateral LE disease - continue ASA,  statin - consider w/u for CAD  9. CKD Stage III Creatinine baseline 1.5-1.7 Check BMEt today.   Referred to Paramedicine.  BMET today. Discussed low salt food choices and to limit fluid intake to < 2 liters per day.   Follow up in 4 weeks. Check EKG at that time.    Darrick Grinder, NP  12:14 PM

## 2017-06-15 NOTE — Progress Notes (Signed)
Paramedicine Encounter   Patient ID: Mario Mills , male,   DOB: October 24, 1950,67 y.o.,  MRN: [id number]  Mario Mills was seen at the AHF clinic with Amy today and reported feeling well. He reported being compliant with his medications. He takes a 500 mg aspirin daily because he has chronic back pain and for the antiplatelet properties. She is keeping Torsemide at 80 mg daily pending a check of his kidney function. Amy reported his fluid looks good today with regard to and edema. We have planned on meeting on Friday at a time TBD.   Time spent with patient: 38 minutes  Jacquiline Doe, EMT 06/15/2017   ACTION: Next visit planned for Friday

## 2017-06-15 NOTE — Patient Instructions (Signed)
No changes to medication at this time.  No lab work today.  Follow up 4 weeks with Amy Clegg NP-C.  Take all medication as prescribed the day of your appointment. Bring all medications with you to your appointment.  Do the following things EVERYDAY: 1) Weigh yourself in the morning before breakfast. Write it down and keep it in a log. 2) Take your medicines as prescribed 3) Eat low salt foods-Limit salt (sodium) to 2000 mg per day.  4) Stay as active as you can everyday 5) Limit all fluids for the day to less than 2 liters

## 2017-06-16 ENCOUNTER — Encounter: Payer: Self-pay | Admitting: *Deleted

## 2017-06-16 ENCOUNTER — Ambulatory Visit: Payer: Medicare Other | Admitting: Cardiology

## 2017-06-21 IMAGING — CT CT ANGIO CHEST
2 of 6 series · 19 of 36 positions shown · IV contrast (omnipaque)
Comparison: Chest x-ray 05/31/2015 and abdominal CT 05/08/2011

CLINICAL DATA: Patient admitted for total knee arthroplasty. Postop
hypoxia and shortness of breath. Evaluate for pulmonary embolism.

EXAM:
CT ANGIOGRAPHY CHEST WITH CONTRAST
TECHNIQUE: Multidetector CT imaging of the chest was performed using the
standard protocol during bolus administration of intravenous
contrast. Multiplanar CT image reconstructions and MIPs were
obtained to evaluate the vascular anatomy.
CONTRAST:  100 mL Omnipaque 300 IV.

[Series 7: pe thins @ 1mm · axial · 0.74mm/px · z∈[-298,-33]mm · 18 of 295 slices shown]
[im 15/295  lung]
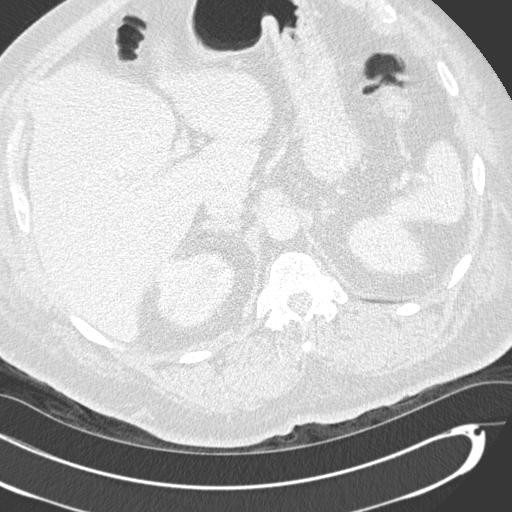
[im 30/295  mediastinal]
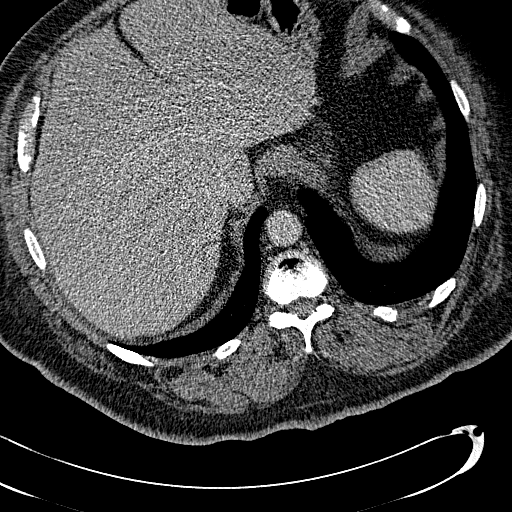
[im 45/295  lung]
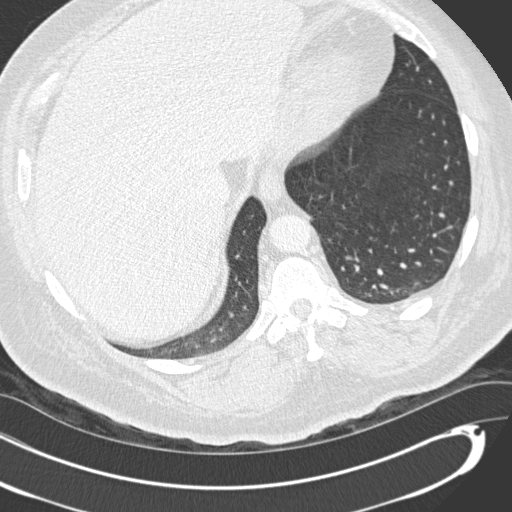
[im 59/295  mediastinal]
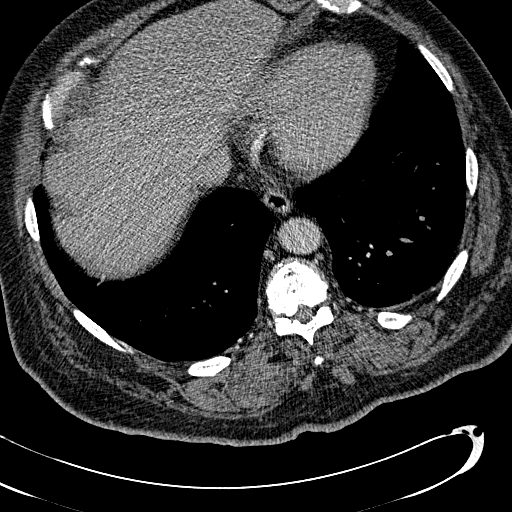
[im 74/295  lung]
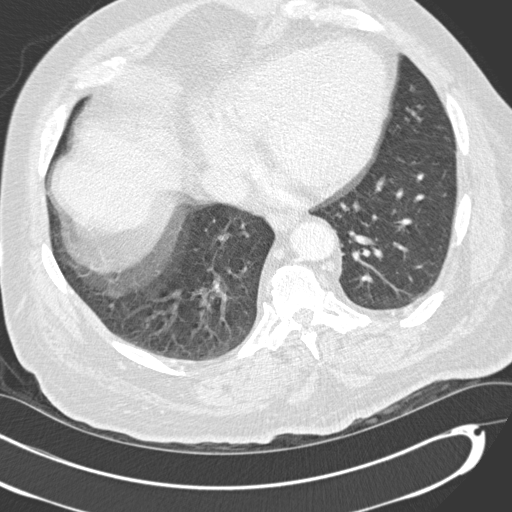
[im 89/295  mediastinal]
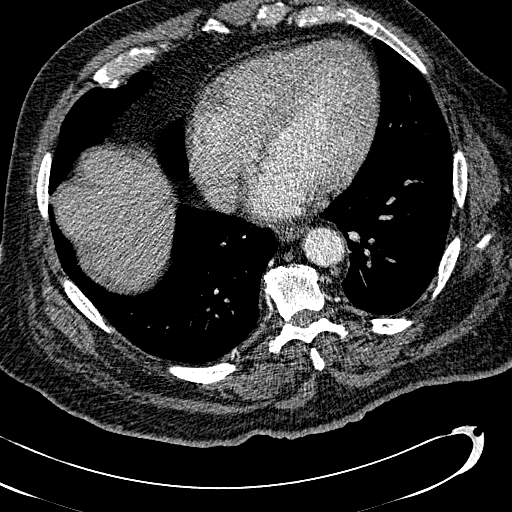
[im 103/295  lung]
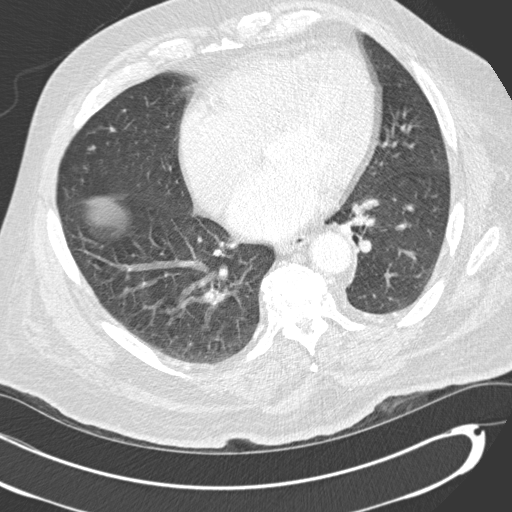
[im 118/295  mediastinal]
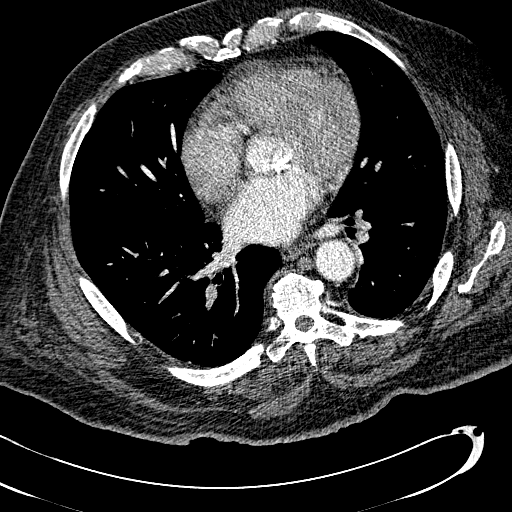
[im 133/295  lung]
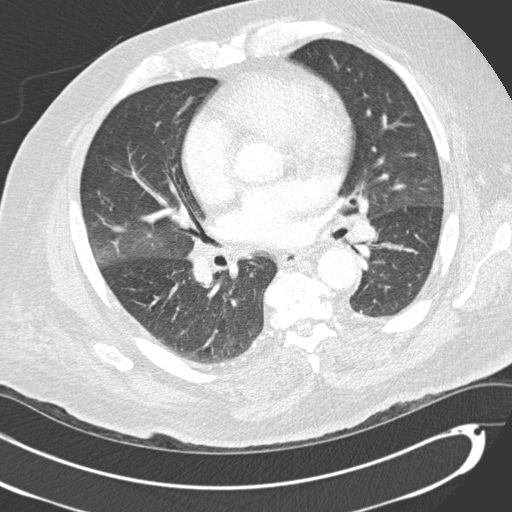
[im 162/295  mediastinal]
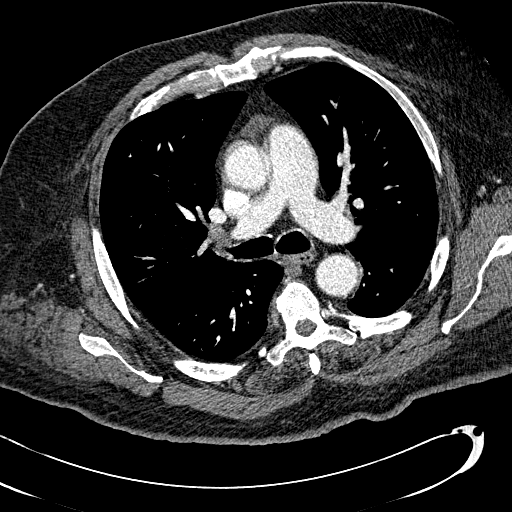
[im 177/295  lung]
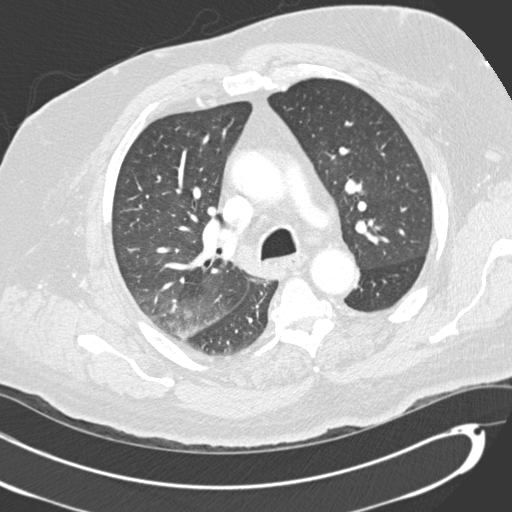
[im 192/295  mediastinal]
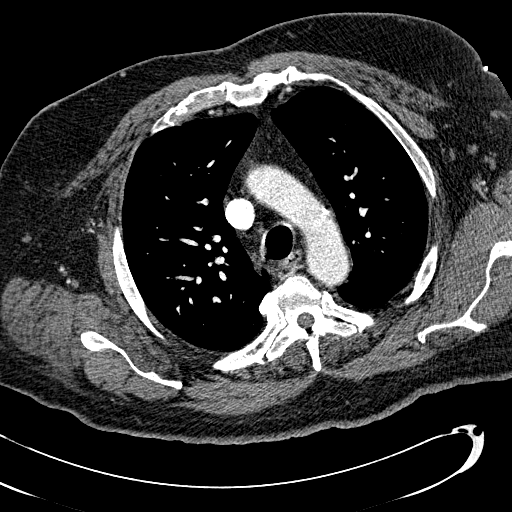
[im 206/295  lung]
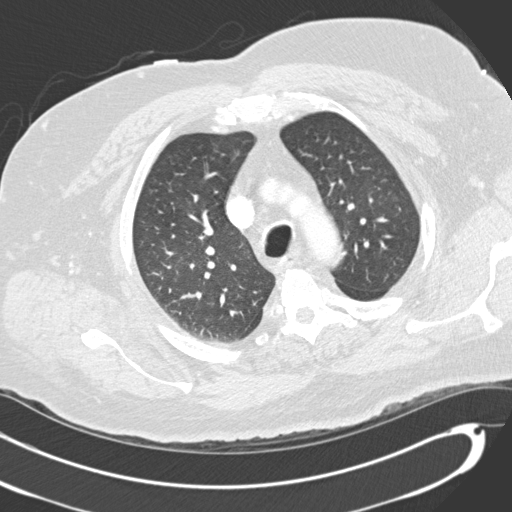
[im 221/295  mediastinal]
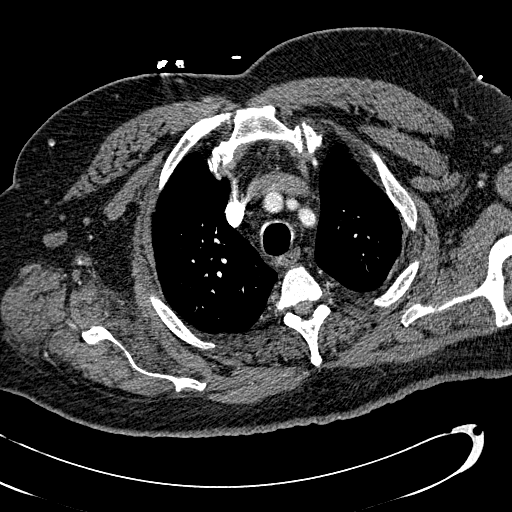
[im 236/295  lung]
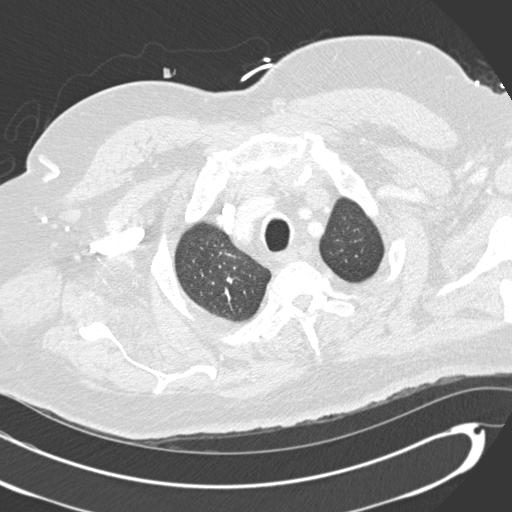
[im 250/295  mediastinal]
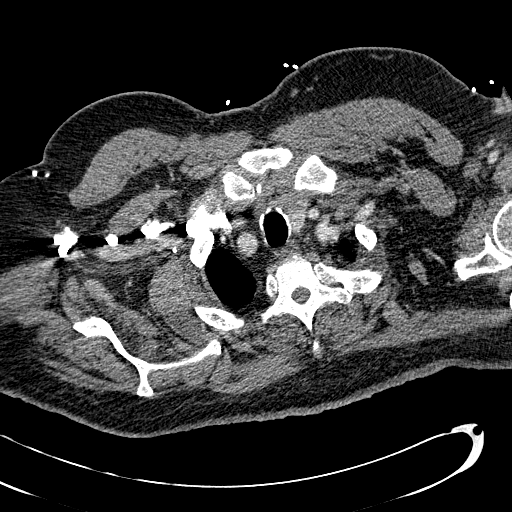
[im 265/295  lung]
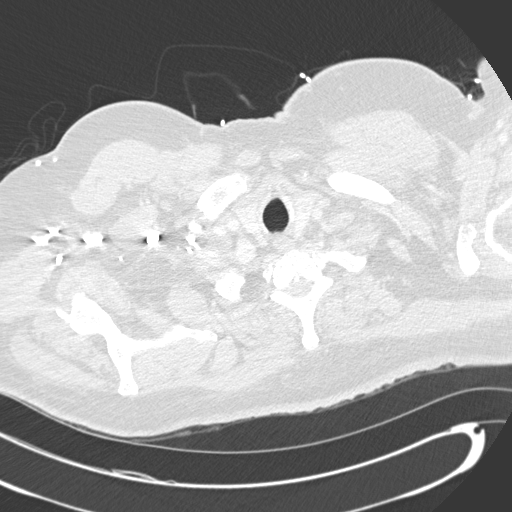
[im 280/295  mediastinal]
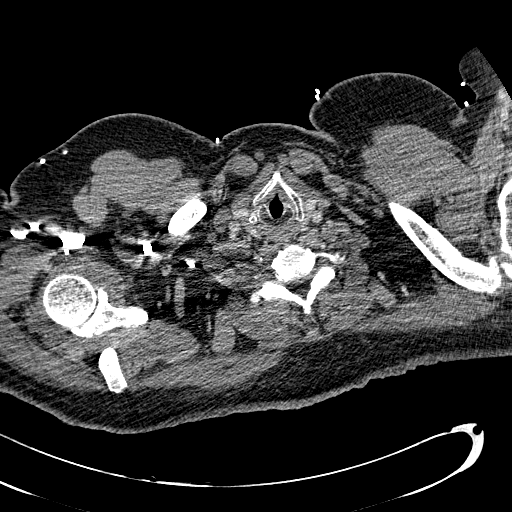

[Series 602: <mpr thick range> · coronal · 0.74mm/px · 1 of 181 slices shown]
[im 91/181  mediastinal]
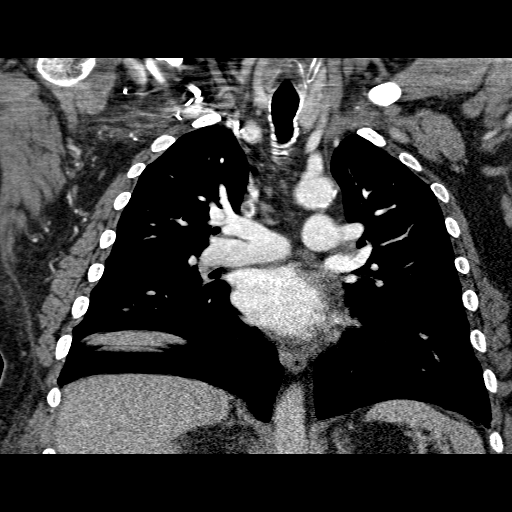

[19 of 36 positions shown; findings below may reference images not displayed]

FINDINGS: The lungs are well inflated with subtle hazy density adjacent the
major fissure over the posterior right upper lobe likely dependent
atelectasis. There has been resolution of the bilateral central
airspace process seen on the recent chest x-ray likely resolved
interstitial edema. There is a small amount of aspirate material
along the right lateral wall lead tip of the distal trachea just
above the carina.

There is mild cardiomegaly. There is minimal calcified plaque over
the thoracic aorta. There is no evidence of pulmonary embolism. No
evidence of mediastinal or hilar adenopathy. No significant axillary
adenopathy.

Images through the upper abdomen demonstrate possible sub cm cyst
over the upper pole right kidney unchanged. There mild degenerate
changes of the spine.

Review of the MIP images confirms the above findings.
IMPRESSION: No evidence of pulmonary embolism.

Interval clearing of the previously noted bilateral central airspace
process on recent chest radiograph likely resolved edema. Minimal
residual hazy density over the posterior right upper lobe likely
atelectasis.

Small amount of aspirate material along the right lateral wall of
the distal trachea just above the carina.

Mild cardiomegaly.

## 2017-06-22 ENCOUNTER — Telehealth (HOSPITAL_COMMUNITY): Payer: Self-pay | Admitting: Cardiology

## 2017-06-22 DIAGNOSIS — I5032 Chronic diastolic (congestive) heart failure: Secondary | ICD-10-CM

## 2017-06-22 NOTE — Telephone Encounter (Signed)
Patient aware and voiced understanding  Repeat labs 2/78

## 2017-06-22 NOTE — Telephone Encounter (Signed)
-----   Message from Conrad [place], NP sent at 06/16/2017  2:34 PM EST ----- Please call and stop potassium. Repeat BMET in 7 days.

## 2017-06-22 NOTE — Addendum Note (Signed)
Addended by: Edilson Vital, Sharlot Gowda on: 06/22/2017 10:45 AM   Modules accepted: Orders

## 2017-06-25 ENCOUNTER — Other Ambulatory Visit (HOSPITAL_COMMUNITY): Payer: Medicare Other

## 2017-06-25 ENCOUNTER — Other Ambulatory Visit (HOSPITAL_COMMUNITY): Payer: Self-pay

## 2017-06-25 NOTE — Progress Notes (Signed)
Paramedicine Encounter    Patient ID: Mario Mills, male    DOB: 1950-07-20, 67 y.o.   MRN: [id number]   Patient Care Team: Nolene Ebbs, MD as PCP - General (Internal Medicine) Lorretta Harp, MD as PCP - Cardiology (Cardiology)  Patient Active Problem List   Diagnosis Date Noted  . Acute on chronic diastolic CHF (congestive heart failure) ([place]) 05/23/2017  . Acute exacerbation of CHF (congestive heart failure) ([place]) 04/11/2017  . Non-compliance 04/11/2017  . AKI (acute kidney injury) ([place]) 04/11/2017  . Aortic stenosis 06/01/2016  . Chronic diastolic CHF (congestive heart failure), NYHA class 2 ([place]) 06/26/2015  . Paroxysmal atrial fibrillation (HCC)   . Chronic anticoagulation   . Acute on chronic diastolic (congestive) heart failure ([place])   . Aspiration pneumonitis ([place]) 06/06/2015  . Pulmonary vascular congestion 06/02/2015  . Postoperative anemia due to acute blood loss 06/02/2015  . Hypertensive urgency 05/31/2015  . Acute respiratory failure with hypoxia ([place]) 05/31/2015  . S/P left knee arthroscopy 05/31/2015  . DM (diabetes mellitus) type II, controlled, with peripheral vascular disorder ([place]) 05/31/2015  . Morbid obesity due to excess calories ([place]) 05/31/2015  . Bradycardia with 41 - 50 beats per minute 05/31/2015  . Sepsis, unspecified organism ([place]) 05/31/2015  . Venous stasis ulcers of both lower extremities ([place]) 03/28/2014  . Essential hypertension 12/22/2012  . Obstructive sleep apnea 12/22/2012    Current Outpatient Medications:  .  Aspirin-Caffeine (BAYER BACK & BODY) 500-32.5 MG TABS, Take 1 tablet by mouth daily., Disp: , Rfl:  .  atorvastatin (LIPITOR) 40 MG tablet, Take 40 mg by mouth every evening., Disp: , Rfl: 2 .  carvedilol (COREG) 12.5 MG tablet, Take 12.5 mg by mouth 2 (two) times daily with a meal., Disp: , Rfl:  .  diazepam (VALIUM) 5 MG tablet, Take 5 mg by mouth daily. , Disp: , Rfl: 2 .  diclofenac (VOLTAREN) 75 MG EC tablet, Take 75  mg by mouth 2 (two) times daily., Disp: , Rfl:  .  diltiazem (CARDIZEM CD) 360 MG 24 hr capsule, Take 1 capsule (360 mg total) by mouth daily., Disp: 30 capsule, Rfl: 0 .  folic acid (FOLVITE) 1 MG tablet, Take 1 mg by mouth daily., Disp: , Rfl:  .  hydrALAZINE (APRESOLINE) 25 MG tablet, Take 3 tablets (75 mg total) by mouth 2 (two) times daily., Disp: 180 tablet, Rfl: 6 .  HYDROmorphone (DILAUDID) 4 MG tablet, Take 4 mg by mouth every 6 (six) hours as needed for severe pain., Disp: , Rfl:  .  LEVEMIR FLEXTOUCH 100 UNIT/ML Pen, Inject 20 Units into the skin at bedtime., Disp: , Rfl: 5 .  lisinopril (PRINIVIL,ZESTRIL) 40 MG tablet, Take 40 mg by mouth daily., Disp: , Rfl: 2 .  metFORMIN (GLUCOPHAGE) 1000 MG tablet, Take 1,000 mg by mouth 2 (two) times daily with a meal., Disp: , Rfl:  .  methotrexate (RHEUMATREX) 2.5 MG tablet, Take 10 mg by mouth once a week., Disp: , Rfl:  .  PROAIR HFA 108 (90 Base) MCG/ACT inhaler, Inhale 1-2 puffs into the lungs every 6 (six) hours as needed for wheezing or shortness of breath., Disp: , Rfl:  .  spironolactone (ALDACTONE) 25 MG tablet, Take 1 tablet (25 mg total) by mouth daily., Disp: 30 tablet, Rfl: 6 .  tamsulosin (FLOMAX) 0.4 MG CAPS capsule, Take 0.4 mg by mouth at bedtime., Disp: , Rfl: 5 .  tolterodine (DETROL LA) 4 MG 24 hr capsule, Take 4 mg by  mouth daily. , Disp: , Rfl:  .  torsemide (DEMADEX) 20 MG tablet, Take 2 tablets (40 mg total) by mouth 2 (two) times daily., Disp: 120 tablet, Rfl: 6 .  triamcinolone cream (KENALOG) 0.1 %, Apply 1 application topically daily as needed., Disp: , Rfl: 1 .  glimepiride (AMARYL) 4 MG tablet, Take 4 mg by mouth daily before breakfast. Reported on 09/24/2015, Disp: , Rfl:  .  hydrOXYzine (ATARAX/VISTARIL) 25 MG tablet, Take 25 mg by mouth 2 (two) times daily as needed for itching., Disp: , Rfl: 2 .  Multiple Vitamin (MULTIVITAMIN WITH MINERALS) TABS tablet, Take 1 tablet by mouth daily., Disp: , Rfl:  No Known  Allergies    Social History   Socioeconomic History  . Marital status: Married    Spouse name: Not on file  . Number of children: 2  . Years of education: post grad  . Highest education level: Not on file  Social Needs  . Financial resource strain: Not on file  . Food insecurity - worry: Not on file  . Food insecurity - inability: Not on file  . Transportation needs - medical: Not on file  . Transportation needs - non-medical: Not on file  Occupational History  . 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 Smoking status: Former Smoker    Packs/day: 1.00    Years: 35.00    Pack years: 35.00    Types: Cigarettes    Last attempt to quit: 07/25/2005    Years since quitting: 11.9  . Smokeless tobacco: Never Used  Substance and Sexual Activity  . Alcohol use: Yes    Alcohol/week: 0.0 oz    Types: 1 Standard drinks or equivalent per week    Comment: rarely   . Drug use: No  . Sexual activity: Not on file  Other Topics Concern  . Not on file  Social History Narrative  . Not on file    Physical Exam  Constitutional: He is oriented to person, place, and time.  Cardiovascular: Normal rate and regular rhythm.  Pulmonary/Chest: Effort normal and breath sounds normal. No respiratory distress. He has no wheezes. He has no rales.  Abdominal: He exhibits no distension.  Musculoskeletal: Normal range of motion. He exhibits no edema.  Neurological: He is alert and oriented to person, place, and time.  Skin: Skin is warm and dry.  Psychiatric: He has a normal mood and affect.        Future Appointments  Date Time Provider [place]  06/30/2017  2:00 PM Gardiner Barefoot, DPM [facility]  07/13/2017  1:30 PM [facility] PA/NP [facility] None  07/20/2017 11:15 AM Lorretta Harp, MD [facility]    BP (!) 138/58 (BP Location: Right Arm, Patient Position: Sitting, Cuff Size: Large)   Pulse (!) 59   Resp 16   Wt 291  lb (132 kg)   SpO2 96%   BMI 39.47 kg/m   Weight yesterday- 291 lb Last visit weight- 294 lb  Mario Mills was seen at home today and reported feeling well. He denied SOB, headache, dizziness or orthopnea. He reported being compliant with all medications and was filling his own pillbox. He had filled the box with only 2 mistakes but is not putting his torsemide in the pillbox with his other medications because he doesn't want to take them and have to use the bathroom while he is running errands. No changes were made during this visit.  Time spent with patient: 30 minutes  Jacquiline Doe, EMT 06/25/17  ACTION: Home visit completed Next visit planned for 1 week

## 2017-06-30 ENCOUNTER — Ambulatory Visit: Payer: Medicare Other | Admitting: Podiatry

## 2017-07-02 ENCOUNTER — Other Ambulatory Visit (HOSPITAL_COMMUNITY): Payer: Self-pay

## 2017-07-02 NOTE — Progress Notes (Signed)
Paramedicine Encounter    Patient ID: Mario Mills, male    DOB: 03-08-1951, 67 y.o.   MRN: [id number]   Patient Care Team: Nolene Ebbs, MD as PCP - General (Internal Medicine) Lorretta Harp, MD as PCP - Cardiology (Cardiology)  Patient Active Problem List   Diagnosis Date Noted  . Acute on chronic diastolic CHF (congestive heart failure) ([place]) 05/23/2017  . Acute exacerbation of CHF (congestive heart failure) ([place]) 04/11/2017  . Non-compliance 04/11/2017  . AKI (acute kidney injury) ([place]) 04/11/2017  . Aortic stenosis 06/01/2016  . Chronic diastolic CHF (congestive heart failure), NYHA class 2 ([place]) 06/26/2015  . Paroxysmal atrial fibrillation (HCC)   . Chronic anticoagulation   . Acute on chronic diastolic (congestive) heart failure ([place])   . Aspiration pneumonitis ([place]) 06/06/2015  . Pulmonary vascular congestion 06/02/2015  . Postoperative anemia due to acute blood loss 06/02/2015  . Hypertensive urgency 05/31/2015  . Acute respiratory failure with hypoxia ([place]) 05/31/2015  . S/P left knee arthroscopy 05/31/2015  . DM (diabetes mellitus) type II, controlled, with peripheral vascular disorder ([place]) 05/31/2015  . Morbid obesity due to excess calories ([place]) 05/31/2015  . Bradycardia with 41 - 50 beats per minute 05/31/2015  . Sepsis, unspecified organism ([place]) 05/31/2015  . Venous stasis ulcers of both lower extremities ([place]) 03/28/2014  . Essential hypertension 12/22/2012  . Obstructive sleep apnea 12/22/2012    Current Outpatient Medications:  .  Aspirin-Caffeine (BAYER BACK & BODY) 500-32.5 MG TABS, Take 1 tablet by mouth daily., Disp: , Rfl:  .  atorvastatin (LIPITOR) 40 MG tablet, Take 40 mg by mouth every evening., Disp: , Rfl: 2 .  carvedilol (COREG) 12.5 MG tablet, Take 12.5 mg by mouth 2 (two) times daily with a meal., Disp: , Rfl:  .  diazepam (VALIUM) 5 MG tablet, Take 5 mg by mouth daily. , Disp: , Rfl: 2 .  diclofenac (VOLTAREN) 75 MG EC tablet, Take 75  mg by mouth 2 (two) times daily., Disp: , Rfl:  .  diltiazem (CARDIZEM CD) 360 MG 24 hr capsule, Take 1 capsule (360 mg total) by mouth daily., Disp: 30 capsule, Rfl: 0 .  folic acid (FOLVITE) 1 MG tablet, Take 1 mg by mouth daily., Disp: , Rfl:  .  hydrALAZINE (APRESOLINE) 25 MG tablet, Take 3 tablets (75 mg total) by mouth 2 (two) times daily., Disp: 180 tablet, Rfl: 6 .  HYDROmorphone (DILAUDID) 4 MG tablet, Take 4 mg by mouth every 6 (six) hours as needed for severe pain., Disp: , Rfl:  .  hydrOXYzine (ATARAX/VISTARIL) 25 MG tablet, Take 25 mg by mouth 2 (two) times daily as needed for itching., Disp: , Rfl: 2 .  LEVEMIR FLEXTOUCH 100 UNIT/ML Pen, Inject 20 Units into the skin at bedtime., Disp: , Rfl: 5 .  lisinopril (PRINIVIL,ZESTRIL) 40 MG tablet, Take 40 mg by mouth daily., Disp: , Rfl: 2 .  metFORMIN (GLUCOPHAGE) 1000 MG tablet, Take 1,000 mg by mouth 2 (two) times daily with a meal., Disp: , Rfl:  .  methotrexate (RHEUMATREX) 2.5 MG tablet, Take 10 mg by mouth once a week., Disp: , Rfl:  .  Multiple Vitamin (MULTIVITAMIN WITH MINERALS) TABS tablet, Take 1 tablet by mouth daily., Disp: , Rfl:  .  PROAIR HFA 108 (90 Base) MCG/ACT inhaler, Inhale 1-2 puffs into the lungs every 6 (six) hours as needed for wheezing or shortness of breath., Disp: , Rfl:  .  spironolactone (ALDACTONE) 25 MG tablet, Take 1 tablet (25  mg total) by mouth daily., Disp: 30 tablet, Rfl: 6 .  tamsulosin (FLOMAX) 0.4 MG CAPS capsule, Take 0.4 mg by mouth at bedtime., Disp: , Rfl: 5 .  tolterodine (DETROL LA) 4 MG 24 hr capsule, Take 4 mg by mouth daily. , Disp: , Rfl:  .  torsemide (DEMADEX) 20 MG tablet, Take 2 tablets (40 mg total) by mouth 2 (two) times daily., Disp: 120 tablet, Rfl: 6 .  triamcinolone cream (KENALOG) 0.1 %, Apply 1 application topically daily as needed., Disp: , Rfl: 1 .  glimepiride (AMARYL) 4 MG tablet, Take 4 mg by mouth daily before breakfast. Reported on 09/24/2015, Disp: , Rfl:  No Known  Allergies    Social History   Socioeconomic History  . Marital status: Married    Spouse name: Not on file  . Number of children: 2  . Years of education: post grad  . Highest education level: Not on file  Social Needs  . Financial resource strain: Not on file  . Food insecurity - worry: Not on file  . Food insecurity - inability: Not on file  . Transportation needs - medical: Not on file  . Transportation needs - non-medical: Not on file  Occupational History  . 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 Smoking status: Former Smoker    Packs/day: 1.00    Years: 35.00    Pack years: 35.00    Types: Cigarettes    Last attempt to quit: 07/25/2005    Years since quitting: 11.9  . Smokeless tobacco: Never Used  Substance and Sexual Activity  . Alcohol use: Yes    Alcohol/week: 0.0 oz    Types: 1 Standard drinks or equivalent per week    Comment: rarely   . Drug use: No  . Sexual activity: Not on file  Other Topics Concern  . Not on file  Social History Narrative  . Not on file    Physical Exam  Constitutional: He is oriented to person, place, and time.  Cardiovascular: Normal rate and regular rhythm.  Pulmonary/Chest: Effort normal and breath sounds normal. No respiratory distress. He has no wheezes. He has no rales.  Abdominal: Soft. Bowel sounds are normal.  Musculoskeletal: Normal range of motion. He exhibits no edema.  Neurological: He is alert and oriented to person, place, and time.  Skin: Skin is warm and dry.        Future Appointments  Date Time Provider [place]  07/13/2017  1:30 PM [facility] PA/NP [facility] None  07/20/2017 11:15 AM Lorretta Harp, MD [facility]    BP 140/60 (BP Location: Left Arm, Patient Position: Sitting, Cuff Size: Large)   Pulse 64   Resp 16   Wt 288 lb (130.6 kg)   SpO2 97%   BMI 39.06 kg/m   Weight yesterday- 291 lb Last visit weight- 291 lb  Mr Bring  was seen at home today and reported feeling well. He denied SOB, dizziness, headache or orthopnea. He has been compliant with his medications and we verified them and refilled his pillbox together. He was a little on the melancholy side today because he found out his cousin was being put into hospice this week due to pancreatic cancer. We talked at length about this and I think talking helped him process it. No changes were necessary at this time thought his systolic pressure was high, he had just taken his "morning" medications when I arrived. We discussed  taking them earlier next week so I can see how the are changing his vital signs and he agreed. If he does well next week, we will move to a bi-weekly schedule.  Time spent with patient: 47 minutes  Jacquiline Doe, EMT 07/02/17  ACTION: Home visit completed Next visit planned for 1 week

## 2017-07-13 ENCOUNTER — Encounter (HOSPITAL_COMMUNITY): Payer: Medicare Other

## 2017-07-20 ENCOUNTER — Ambulatory Visit: Payer: Medicare Other | Admitting: Cardiovascular Disease

## 2017-07-22 ENCOUNTER — Telehealth (HOSPITAL_COMMUNITY): Payer: Self-pay

## 2017-07-22 NOTE — Telephone Encounter (Signed)
I called Mario Mills today to schedule an appointment. He stated he would be in town tomorrow and could meet at 12:30. We agreed on that time and at his house to meet tomorrow.

## 2017-07-23 ENCOUNTER — Other Ambulatory Visit (HOSPITAL_COMMUNITY): Payer: Self-pay

## 2017-07-23 NOTE — Progress Notes (Signed)
Paramedicine Encounter    Patient ID: Mario Mills, male    DOB: 01-Apr-1951, 67 y.o.   MRN: [id number]   Patient Care Team: Mario Ebbs, MD as PCP - General (Internal Medicine) Mario Harp, MD as PCP - Cardiology (Cardiology)  Patient Active Problem List   Diagnosis Date Noted  . Acute on chronic diastolic CHF (congestive heart failure) ([place]) 05/23/2017  . Acute exacerbation of CHF (congestive heart failure) ([place]) 04/11/2017  . Non-compliance 04/11/2017  . AKI (acute kidney injury) ([place]) 04/11/2017  . Aortic stenosis 06/01/2016  . Chronic diastolic CHF (congestive heart failure), NYHA class 2 ([place]) 06/26/2015  . Paroxysmal atrial fibrillation (HCC)   . Chronic anticoagulation   . Acute on chronic diastolic (congestive) heart failure ([place])   . Aspiration pneumonitis ([place]) 06/06/2015  . Pulmonary vascular congestion 06/02/2015  . Postoperative anemia due to acute blood loss 06/02/2015  . Hypertensive urgency 05/31/2015  . Acute respiratory failure with hypoxia ([place]) 05/31/2015  . S/P left knee arthroscopy 05/31/2015  . DM (diabetes mellitus) type II, controlled, with peripheral vascular disorder ([place]) 05/31/2015  . Morbid obesity due to excess calories ([place]) 05/31/2015  . Bradycardia with 41 - 50 beats per minute 05/31/2015  . Sepsis, unspecified organism ([place]) 05/31/2015  . Venous stasis ulcers of both lower extremities ([place]) 03/28/2014  . Essential hypertension 12/22/2012  . Obstructive sleep apnea 12/22/2012    Current Outpatient Medications:  .  Aspirin-Caffeine (BAYER BACK & BODY) 500-32.5 MG TABS, Take 1 tablet by mouth daily., Disp: , Rfl:  .  atorvastatin (LIPITOR) 40 MG tablet, Take 40 mg by mouth every evening., Disp: , Rfl: 2 .  carvedilol (COREG) 12.5 MG tablet, Take 12.5 mg by mouth 2 (two) times daily with a meal., Disp: , Rfl:  .  diclofenac (VOLTAREN) 75 MG EC tablet, Take 75 mg by mouth 2 (two) times daily., Disp: , Rfl:  .  diltiazem (CARDIZEM CD)  360 MG 24 hr capsule, Take 1 capsule (360 mg total) by mouth daily., Disp: 30 capsule, Rfl: 0 .  folic acid (FOLVITE) 1 MG tablet, Take 1 mg by mouth daily., Disp: , Rfl:  .  hydrALAZINE (APRESOLINE) 25 MG tablet, Take 3 tablets (75 mg total) by mouth 2 (two) times daily., Disp: 180 tablet, Rfl: 6 .  LEVEMIR FLEXTOUCH 100 UNIT/ML Pen, Inject 20 Units into the skin at bedtime., Disp: , Rfl: 5 .  lisinopril (PRINIVIL,ZESTRIL) 40 MG tablet, Take 40 mg by mouth daily., Disp: , Rfl: 2 .  metFORMIN (GLUCOPHAGE) 1000 MG tablet, Take 1,000 mg by mouth 2 (two) times daily with a meal., Disp: , Rfl:  .  methotrexate (RHEUMATREX) 2.5 MG tablet, Take 10 mg by mouth once a week., Disp: , Rfl:  .  Multiple Vitamin (MULTIVITAMIN WITH MINERALS) TABS tablet, Take 1 tablet by mouth daily., Disp: , Rfl:  .  PROAIR HFA 108 (90 Base) MCG/ACT inhaler, Inhale 1-2 puffs into the lungs every 6 (six) hours as needed for wheezing or shortness of breath., Disp: , Rfl:  .  spironolactone (ALDACTONE) 25 MG tablet, Take 1 tablet (25 mg total) by mouth daily., Disp: 30 tablet, Rfl: 6 .  tamsulosin (FLOMAX) 0.4 MG CAPS capsule, Take 0.4 mg by mouth at bedtime., Disp: , Rfl: 5 .  torsemide (DEMADEX) 20 MG tablet, Take 2 tablets (40 mg total) by mouth 2 (two) times daily., Disp: 120 tablet, Rfl: 6 .  triamcinolone cream (KENALOG) 0.1 %, Apply 1 application topically daily as needed.,  Disp: , Rfl: 1 .  diazepam (VALIUM) 5 MG tablet, Take 5 mg by mouth daily. , Disp: , Rfl: 2 .  glimepiride (AMARYL) 4 MG tablet, Take 4 mg by mouth daily before breakfast. Reported on 09/24/2015, Disp: , Rfl:  .  HYDROmorphone (DILAUDID) 4 MG tablet, Take 4 mg by mouth every 6 (six) hours as needed for severe pain., Disp: , Rfl:  .  hydrOXYzine (ATARAX/VISTARIL) 25 MG tablet, Take 25 mg by mouth 2 (two) times daily as needed for itching., Disp: , Rfl: 2 .  tolterodine (DETROL LA) 4 MG 24 hr capsule, Take 4 mg by mouth daily. , Disp: , Rfl:  No Known  Allergies    Social History   Socioeconomic History  . Marital status: Married    Spouse name: Not on file  . Number of children: 2  . Years of education: post grad  . Highest education level: Not on file  Social Needs  . Financial resource strain: Not on file  . Food insecurity - worry: Not on file  . Food insecurity - inability: Not on file  . Transportation needs - medical: Not on file  . Transportation needs - non-medical: Not on file  Occupational History  . 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 Smoking status: Former Smoker    Packs/day: 1.00    Years: 35.00    Pack years: 35.00    Types: Cigarettes    Last attempt to quit: 07/25/2005    Years since quitting: 12.0  . Smokeless tobacco: Never Used  Substance and Sexual Activity  . Alcohol use: Yes    Alcohol/week: 0.0 oz    Types: 1 Standard drinks or equivalent per week    Comment: rarely   . Drug use: No  . Sexual activity: Not on file  Other Topics Concern  . Not on file  Social History Narrative  . Not on file    Physical Exam  Constitutional: He is oriented to person, place, and time.  Cardiovascular: Normal rate and regular rhythm.  Pulmonary/Chest: Effort normal and breath sounds normal. No respiratory distress. He has no wheezes. He has no rales.  Abdominal: Soft.  Musculoskeletal: Normal range of motion. He exhibits no edema.  Neurological: He is alert and oriented to person, place, and time.  Skin: Skin is warm and dry.  Psychiatric: He has a normal mood and affect.        No future appointments.  BP (!) 158/60 (BP Location: Left Arm, Patient Position: Sitting, Cuff Size: Large)   Pulse (!) 58   Resp 16   Wt 292 lb (132.5 kg)   SpO2 98%   BMI 39.60 kg/m   Weight yesterday- 290 lb Last visit weight- 288 lb  Mario Mills was seen at home today and reported feeling well. He denied SOB, dizziness or orthopnea. He has had the occasional  headache but they have not lasted for more than a day and resolve without intervention. He had already filled his own pillbox so we verbally verified his medications. He seems to do very well with knowing what medications he is taking and when he takes them. His blood pressure was elevated but he said he ate a breakfast high in sodium, including country ham and fat back. He said He knew these were bad options for him but I reiterated the reason why he should avoid these foods. He agreed and said he would do better.  Time spent with patient: 54 minutes  Jacquiline Doe, EMT 07/23/17  ACTION: Home visit completed Next visit planned for 2 weeks

## 2017-08-12 ENCOUNTER — Telehealth (HOSPITAL_COMMUNITY): Payer: Self-pay

## 2017-08-12 NOTE — Telephone Encounter (Signed)
I Called Mario Mills to schedule an appointment. He stated he was going to be out of town this Friday but stated he could meet next week instead. At that meeting we will discuss discharge which I believe is right for him at this time.

## 2017-10-20 ENCOUNTER — Telehealth (HOSPITAL_COMMUNITY): Payer: Self-pay | Admitting: Surgery

## 2017-10-20 NOTE — Telephone Encounter (Signed)
Patient has been discharged from [facility] secondary to successful completion.  He will continue to follow in the [facility].

## 2018-01-15 ENCOUNTER — Other Ambulatory Visit (HOSPITAL_COMMUNITY): Payer: Self-pay | Admitting: Student

## 2018-01-28 ENCOUNTER — Other Ambulatory Visit (HOSPITAL_COMMUNITY): Payer: Self-pay

## 2018-01-28 MED ORDER — SPIRONOLACTONE 25 MG PO TABS
25.0000 mg | ORAL_TABLET | Freq: Every day | ORAL | 3 refills | Status: DC
Start: 2018-01-28 — End: 2018-04-24

## 2018-04-24 ENCOUNTER — Other Ambulatory Visit (HOSPITAL_COMMUNITY): Payer: Self-pay | Admitting: Internal Medicine

## 2018-06-12 ENCOUNTER — Other Ambulatory Visit (HOSPITAL_COMMUNITY): Payer: Self-pay | Admitting: Internal Medicine

## 2018-06-21 ENCOUNTER — Other Ambulatory Visit (HOSPITAL_COMMUNITY): Payer: Self-pay

## 2018-06-21 MED ORDER — SPIRONOLACTONE 25 MG PO TABS: 25.0000 mg | ORAL_TABLET | Freq: Every day | ORAL | 0 refills | Status: DC

## 2018-07-10 ENCOUNTER — Other Ambulatory Visit (HOSPITAL_COMMUNITY): Payer: Self-pay | Admitting: Student

## 2018-07-17 ENCOUNTER — Other Ambulatory Visit (HOSPITAL_COMMUNITY): Payer: Self-pay | Admitting: Internal Medicine

## 2018-08-06 ENCOUNTER — Other Ambulatory Visit (HOSPITAL_COMMUNITY): Payer: Self-pay | Admitting: Student

## 2018-08-14 ENCOUNTER — Other Ambulatory Visit (HOSPITAL_COMMUNITY): Payer: Self-pay | Admitting: Student

## 2018-09-21 ENCOUNTER — Other Ambulatory Visit (HOSPITAL_COMMUNITY): Payer: Self-pay | Admitting: Student

## 2018-10-07 ENCOUNTER — Other Ambulatory Visit (HOSPITAL_COMMUNITY): Payer: Self-pay | Admitting: Internal Medicine

## 2018-10-07 NOTE — Telephone Encounter (Signed)
Patient has not been seen since 06/15/2017 and was suppose to f/u in 4 weeks. [place] for refill? Please advise

## 2018-10-18 ENCOUNTER — Other Ambulatory Visit (HOSPITAL_COMMUNITY): Payer: Self-pay | Admitting: Student

## 2018-10-20 ENCOUNTER — Other Ambulatory Visit (HOSPITAL_COMMUNITY): Payer: Self-pay | Admitting: Student

## 2018-11-13 ENCOUNTER — Other Ambulatory Visit (HOSPITAL_COMMUNITY): Payer: Self-pay | Admitting: Internal Medicine

## 2018-11-29 ENCOUNTER — Other Ambulatory Visit (HOSPITAL_COMMUNITY): Payer: Self-pay | Admitting: Cardiology

## 2018-11-29 ENCOUNTER — Other Ambulatory Visit (HOSPITAL_COMMUNITY): Payer: Self-pay | Admitting: Internal Medicine

## 2018-12-06 ENCOUNTER — Other Ambulatory Visit: Payer: Self-pay

## 2018-12-06 ENCOUNTER — Ambulatory Visit (HOSPITAL_COMMUNITY)
Admission: RE | Admit: 2018-12-06 | Discharge: 2018-12-06 | Disposition: A | Discharge status: Home / Self Care | Payer: Medicare HMO | Source: Ambulatory Visit | Attending: Internal Medicine | Admitting: Internal Medicine

## 2018-12-06 ENCOUNTER — Other Ambulatory Visit (HOSPITAL_COMMUNITY): Payer: Self-pay | Admitting: Cardiology

## 2018-12-06 ENCOUNTER — Telehealth (HOSPITAL_COMMUNITY): Payer: Self-pay | Admitting: Internal Medicine

## 2018-12-06 NOTE — Progress Notes (Signed)
Visit rescheduled for 12/07/18.

## 2018-12-06 NOTE — Telephone Encounter (Signed)
Lvm for PT on home phone regarding scheduling a telephone visit with Dr. Haroldine Laws today @ 2:30 per request received from [place].  Asked PT to contact the office to schedule appt.  Barnett Applebaum

## 2018-12-07 ENCOUNTER — Ambulatory Visit (HOSPITAL_COMMUNITY)
Admission: RE | Admit: 2018-12-07 | Discharge: 2018-12-07 | Disposition: A | Discharge status: Home / Self Care | Payer: No Typology Code available for payment source | Source: Ambulatory Visit | Attending: Internal Medicine | Admitting: Internal Medicine

## 2018-12-07 ENCOUNTER — Other Ambulatory Visit: Payer: Self-pay

## 2018-12-07 DIAGNOSIS — I350 Nonrheumatic aortic (valve) stenosis: Secondary | ICD-10-CM

## 2018-12-07 DIAGNOSIS — I5032 Chronic diastolic (congestive) heart failure: Secondary | ICD-10-CM | POA: Diagnosis not present

## 2018-12-07 DIAGNOSIS — I10 Essential (primary) hypertension: Secondary | ICD-10-CM

## 2018-12-07 NOTE — Addendum Note (Signed)
Encounter addended by: Scarlette Calico, RN on: 12/07/2018 4:36 PM  Actions taken: Order list changed, Diagnosis association updated, Clinical Note Signed

## 2018-12-07 NOTE — Progress Notes (Signed)
AVS mailed to pt.

## 2018-12-07 NOTE — Patient Instructions (Signed)
We will contact you in 6 months to schedule your next appointment and echocardiogram  If you have any questions or concerns before your next appointment please send [place] a message through [place] or call our office at [phone].  At the [facility], you and your health needs are our priority. As part of our continuing mission to provide you with exceptional heart care, we have created designated Provider Care Teams. These Care Teams include your primary Cardiologist (physician) and Advanced Practice Providers (APPs- Physician Assistants and Nurse Practitioners) who all work together to provide you with the care you need, when you need it.   You may see any of the following providers on your designated Care Team at your next follow up: [name] Dr Glori Bickers . Dr Loralie Champagne . Darrick Grinder, NP

## 2018-12-07 NOTE — Progress Notes (Signed)
Heart Failure TeleHealth Note  Due to national recommendations of social distancing due to [place] 19, Audio/video telehealth visit is felt to be most appropriate for this patient at this time.  See MyChart message from today for patient consent regarding telehealth for [facility].  Date:  12/07/2018   ID:  Mario Mills, DOB 08-31-1950, MRN [id number]  Location: Home  Provider location: [facility] Type of Visit: Established patient  PCP:  Nolene Ebbs, MD  Cardiologist:  Quay Burow, MD Primary HF: Lynia Landry  Chief Complaint: Heart Failure follow-up   History of Present Illness:  Mario Mills a 68 y.o.malewith a prior history of HFpEF, hypertension, hyperlipidemia, obesity,OSA,  diabetes, GERD, chronic venous stasis (followed in [facility] lymphedema clinic), moderate aortic stenosis, psoriasis and questionable h/o paroxysmal atrial fibrillation.  Admitted to [facility] on 05/23/2017 with progressive SOB. Found to have marked volume overload despite adjustment of outpatient diuretics. Pt also noted to have hypertensive urgency with systolic BP >093 on arrival. There was some question about compliance at home. Overall pt diuresed 28 lbs down. Discharged on 1/9. Weight on d/c 292. Discharged on torsemide 40/20.   Today he presents via Engineer, civil (consulting) for a telehealth visit. Last visit torsemide was increased to 40 mg twice a day. Says he feels pretty good. Denies SOB, orthopnea or PND. Doing some walking but limited by back pain. Saw Dr. Durene Romans last week and was told he doing OK - felt he was dehydrated. Now taking torsemide 40 daily. Blood work ok. SBP typically 130-140s  Echo 05/24/2017  EF 60-65% Normal RV  Moderate AS mean gradient 34 mm hg  [name] denies symptoms worrisome for COVID 19.   Past Medical History:  Diagnosis Date  . Arthritis   . Chronic diastolic heart failure ([place])    a. 10/2016 Echo: EF 55-60%, gr2  DD, triv AI, mod dil LA, mildly dil RV/RA.  [name] Chronic low back pain with sciatica   . Constipation   . DM (diabetes mellitus) type II controlled peripheral vascular disorder   . GERD (gastroesophageal reflux disease)   . History of stress test    a. 04/2015 MV: EF 47%, mild basal inflat and mid inflat defect c/w ischemia. EF nl by echo @ that time and no further isch w/u pursued.  . Hyperlipidemia   . Hypertension   . Hypokalemia   . Microcytic anemia   . Moderate aortic stenosis    a. 10/2016 Echo: Mod AS.  . Numbness and tingling    feet bilat   . Obstructive sleep apnea    a. compliant w/ CPAP.  [name] Peripheral arterial disease ([place])   . Primary osteoarthritis of left knee   . Urinary incontinence   . Venous insufficiency    a. chronic LEE-->wound mgmt/wraps  . Venous stasis ulcer ([place])    Past Surgical History:  Procedure Laterality Date  . COLON RESECTION  2007  . COLON SURGERY    . TONSILLECTOMY    . TOTAL KNEE ARTHROPLASTY Left 05/31/2015   Procedure: TOTAL LEFT  KNEE ARTHROPLASTY;  Surgeon: Gaynelle Arabian, MD;  Location: [facility];  Service: Orthopedics;  Laterality: Left;  . TRANSTHORACIC ECHOCARDIOGRAM  07/02/2005   mod concentric LVH; LV borderline dilated; LA mild-mod dilated; mild MR & TR  . VARICOSE VEIN SURGERY     R leg     Current Outpatient Medications  Medication Sig Dispense Refill  . Aspirin-Caffeine (BAYER BACK & BODY) 500-32.5  MG TABS Take 1 tablet by mouth daily.    [name] atorvastatin (LIPITOR) 40 MG tablet Take 40 mg by mouth every evening.  2  . carvedilol (COREG) 12.5 MG tablet Take 12.5 mg by mouth 2 (two) times daily with a meal.    . diazepam (VALIUM) 5 MG tablet Take 5 mg by mouth daily.   2  . diclofenac (VOLTAREN) 75 MG EC tablet Take 75 mg by mouth 2 (two) times daily.    [name] diltiazem (CARDIZEM CD) 360 MG 24 hr capsule Take 1 capsule (360 mg total) by mouth daily. 30 capsule 0  . folic acid (FOLVITE) 1 MG tablet Take 1 mg by mouth daily.    [name]  glimepiride (AMARYL) 4 MG tablet Take 4 mg by mouth daily before breakfast. Reported on 09/24/2015    . hydrALAZINE (APRESOLINE) 25 MG tablet TAKE 3 TABLETS BY MOUTH TWICE DAILY 540 tablet 1  . HYDROmorphone (DILAUDID) 4 MG tablet Take 4 mg by mouth every 6 (six) hours as needed for severe pain.    . hydrOXYzine (ATARAX/VISTARIL) 25 MG tablet Take 25 mg by mouth 2 (two) times daily as needed for itching.  2  . LEVEMIR FLEXTOUCH 100 UNIT/ML Pen Inject 20 Units into the skin at bedtime.  5  . lisinopril (PRINIVIL,ZESTRIL) 40 MG tablet Take 40 mg by mouth daily.  2  . metFORMIN (GLUCOPHAGE) 1000 MG tablet Take 1,000 mg by mouth 2 (two) times daily with a meal.    . methotrexate (RHEUMATREX) 2.5 MG tablet Take 10 mg by mouth once a week.    . Multiple Vitamin (MULTIVITAMIN WITH MINERALS) TABS tablet Take 1 tablet by mouth daily.    [name] PROAIR HFA 108 (90 Base) MCG/ACT inhaler Inhale 1-2 puffs into the lungs every 6 (six) hours as needed for wheezing or shortness of breath.    . spironolactone (ALDACTONE) 25 MG tablet TAKE 1 TABLET (25 MG TOTAL) BY MOUTH DAILY. MUST HAVE APPT FOR FURTHER REFILLS 15 tablet 0  . tamsulosin (FLOMAX) 0.4 MG CAPS capsule Take 0.4 mg by mouth at bedtime.  5  . tolterodine (DETROL LA) 4 MG 24 hr capsule Take 4 mg by mouth daily.     [name] torsemide (DEMADEX) 20 MG tablet Take 2 tablets (40 mg total) by mouth 2 (two) times daily. 120 tablet 6  . triamcinolone cream (KENALOG) 0.1 % Apply 1 application topically daily as needed.  1   No current facility-administered medications for this encounter.     Allergies:   Patient has no known allergies.   Social History:  The patient  reports that he quit smoking about 13 years ago. His smoking use included cigarettes. He has a 35.00 pack-year smoking history. He has never used smokeless tobacco. He reports current alcohol use of about 0.5 standard drinks of alcohol per week. He reports that he does not use drugs.   Family History:  The  patient's family history includes Cancer in his father; Diabetes in his brother; Heart Problems in his mother; Hypertension in his brother and mother; Pancreatic cancer in his father.   ROS:  Please see the history of present illness.   All other systems are personally reviewed and negative.   Exam:  (Video/Tele Health Call; Exam is subjective and or/visual.) General:  Speaks in full sentences. No resp difficulty. Lungs: Normal respiratory effort with conversation.  Abdomen: Non-distended per patient report Extremities: Pt denies edema. Neuro: Alert & oriented x 3.   Recent Labs: No  results found for requested labs within last 8760 hours.  Personally reviewed   Wt Readings from Last 3 Encounters:  07/23/17 132.5 kg (292 lb)  07/02/17 130.6 kg (288 lb)  06/25/17 132 kg (291 lb)      ASSESSMENT AND PLAN:  1. Chronic diastolic congestive heart failure - Echo 05/24/17 LVEF 60-65% Moderate AS. RV ok  - Stable NYHA II-III - Volume status stable.  - Continue torsemide 40 mg daily (was on 40 bid but cut back)   2. Aortic stenosis - Moderate by echo 05/24/17 mean gradient 28mmHg. Continue to follow - Repeat echo 6 months   3. Hypertension, severe - BP improved. SBP 130-140.  - Continue coreg 12.5 mg BID - Continue diltiazem360mg  daily.  - Continue spiro 25 mg daily. - Continue lisinopril 40 mg daily -Continue hydralazine to 75 mg BID. 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 No mention of RAS.   4. Chronic venous insufficiency:  - He was followed by lymphedema clinic as well as vascular surgery and wound clinic at [facility]. - Now much improved  5. Obstructive sleep apnea:  - Continue CPAP qhs.No change.  6. Type 2 diabetes mellitus:  - Consider Jardiance  7. ? PAF - not confirmed.  If we can get confirmation will need AC.   8. PAD - followed at [facility]. Has bilateral LE disease - continue ASA, statin - consider  w/u for CAD  9. CKD Stage III - Creatinine baseline 1.5-1.7 - Had recent labs with Dr. Durene Romans  Time spent on audio/video call 19 mins.   F/u: as above  No sx concerning for COVID-19  Signed, Glori Bickers, MD  12/07/2018 3:57 PM  Advanced [facility] [place] [address] [facility] and [place] [address] [phone] (office) [phone] (fax)

## 2019-01-15 ENCOUNTER — Other Ambulatory Visit (HOSPITAL_COMMUNITY): Payer: Self-pay | Admitting: Internal Medicine

## 2019-01-28 ENCOUNTER — Other Ambulatory Visit (HOSPITAL_COMMUNITY): Payer: Self-pay | Admitting: Internal Medicine

## 2019-07-04 ENCOUNTER — Other Ambulatory Visit (HOSPITAL_COMMUNITY): Payer: Self-pay | Admitting: Cardiology

## 2019-08-17 ENCOUNTER — Encounter: Payer: Self-pay | Admitting: Physician Assistant

## 2019-09-01 ENCOUNTER — Ambulatory Visit: Payer: No Typology Code available for payment source | Admitting: Physician Assistant

## 2019-09-05 ENCOUNTER — Other Ambulatory Visit (INDEPENDENT_AMBULATORY_CARE_PROVIDER_SITE_OTHER): Payer: Medicare HMO

## 2019-09-05 ENCOUNTER — Encounter: Payer: Self-pay | Admitting: Nurse Practitioner

## 2019-09-05 ENCOUNTER — Ambulatory Visit (INDEPENDENT_AMBULATORY_CARE_PROVIDER_SITE_OTHER): Payer: Medicare HMO | Admitting: Nurse Practitioner

## 2019-09-05 VITALS — BP 120/56 | HR 76 | Temp 97.3°F | Ht 72.0 in | Wt 292.4 lb

## 2019-09-05 DIAGNOSIS — D509 Iron deficiency anemia, unspecified: Secondary | ICD-10-CM

## 2019-09-05 DIAGNOSIS — R195 Other fecal abnormalities: Secondary | ICD-10-CM

## 2019-09-05 LAB — CBC WITH DIFFERENTIAL/PLATELET
Basophils Absolute: 0.1 10*3/uL (ref 0.0–0.1)
Basophils Relative: 0.6 % (ref 0.0–3.0)
Eosinophils Absolute: 0.3 10*3/uL (ref 0.0–0.7)
Eosinophils Relative: 3.5 % (ref 0.0–5.0)
HCT: 35.7 % — ABNORMAL LOW (ref 39.0–52.0)
Hemoglobin: 11.4 g/dL — ABNORMAL LOW (ref 13.0–17.0)
Lymphocytes Relative: 20.1 % (ref 12.0–46.0)
Lymphs Abs: 1.8 10*3/uL (ref 0.7–4.0)
MCHC: 31.8 g/dL (ref 30.0–36.0)
MCV: 94.1 fl (ref 78.0–100.0)
Monocytes Absolute: 0.7 10*3/uL (ref 0.1–1.0)
Monocytes Relative: 7.5 % (ref 3.0–12.0)
Neutro Abs: 6 10*3/uL (ref 1.4–7.7)
Neutrophils Relative %: 68.3 % (ref 43.0–77.0)
Platelets: 307 10*3/uL (ref 150.0–400.0)
RBC: 3.8 Mil/uL — ABNORMAL LOW (ref 4.22–5.81)
RDW: 16.5 % — ABNORMAL HIGH (ref 11.5–15.5)
WBC: 8.8 10*3/uL (ref 4.0–10.5)

## 2019-09-05 MED ORDER — NA SULFATE-K SULFATE-MG SULF 17.5-3.13-1.6 GM/177ML PO SOLN: 1.0000 | Freq: Once | ORAL | 0 refills | Status: AC

## 2019-09-05 NOTE — Patient Instructions (Signed)
If you are age 70 or older, your body mass index should be between 23-30. Your Body mass index is 39.66 kg/m. If this is out of the aforementioned range listed, please consider follow up with your Primary Care Provider.  If you are age 65 or younger, your body mass index should be between 19-25. Your Body mass index is 39.66 kg/m. If this is out of the aformentioned range listed, please consider follow up with your Primary Care Provider.   You have been scheduled for an endoscopy and colonoscopy. Please follow the written instructions given to you at your visit today. Please pick up your prep supplies at the pharmacy within the next 1-3 days. If you use inhalers (even only as needed), please bring them with you on the day of your procedure.  Your provider has requested that you go to the basement level for lab work before leaving today. Press "B" on the elevator. The lab is located at the first door on the left as you exit the elevator.  Due to recent changes in healthcare laws, you may see the results of your imaging and laboratory studies on MyChart before your provider has had a chance to review them.  We understand that in some cases there may be results that are confusing or concerning to you. Not all laboratory results come back in the same time frame and the provider may be waiting for multiple results in order to interpret others.  Please give [place] 48 hours in order for your provider to thoroughly review all the results before contacting the office for clarification of your results.   Be sure to HOLD your Iron starting 10 days prior to your Colonoscopy.  Mario Mills Will contact you about when to stop your Diabetic Medications.

## 2019-09-05 NOTE — H&P (View-Only) (Signed)
ASSESSMENT / PLAN:   69 year old male with PMH significant for chronic diastolic heart failure hypertension, hyperlipidemia, ? CKD, obesity, OSA, diabetes, moderate aortic stenosis, psoriasis, questionable history of PAF remote history of colon polyps, remote colovesicular fistula s/p segmental colectomy.   # Iron deficiency anemia, Hemoccult positive stool.  --Dark to black stools for several weeks, heme + at PCP's office --Ferritin 22.  Hemoglobin 10 in February, I do not more recent labs for review.  Will repeat CBC today. He has already been started on iron replacement. --For further evaluation of IDA patient will be scheduled for EGD and colonoscopy (last one was 2007 and incomplete).  The risks and benefits of EGD and colonoscopy with possible polypectomy / biopsies were discussed and the patient agrees to proceed.  --Patient has multiple medical problems but meets criteria for procedure to be done at the [facility] --Continue omeprazole 20 mg twice daily for now  #Chronic diastolic heart failure.   --LVEF 60 to 65%.  Moderate aortic stenosis   HPI:     Chief Complaint:  None. Blood found in stool.   History comes from chart and patient  Mario Mills is a 69 year old male with multiple medical problems.  He is on multiple medications including methotrexate ( ? Reason). He had a limited colonoscopy by Dr. Ardis Hughs in 2007 for evaluation of a colovesicular fistula for which he underwent a segmental colectomy.  Patient has not been seen here since.  He has not been followed by any other Gastroenterologist.   Patient is referred by PCP for evaluation of Hemoccult positive stool and anemia.  His stools have been dark to black over the last 1 to 2 months.  No bismuth use.  He did start iron but that was just a few weeks ago.  Patient saw his PCP on 08/16/2019 for evaluation of constipation, dark stool and abdominal discomfort.  PCPs note mentions epigastric pain, patient tells  me the pain was actually in the RLQ.  At any rate he was Hemoccult positive on PCPs exam that day.  Patient was started on omeprazole 20 mg twice daily.  Constipation he was prescribed Linzess. Patient believes he had labs drawn that day, I do not have those results.  I do have results from labs drawn 06/21/2019.  At that time BUN was 28, creatinine 1.63.  TIBC 277, iron saturation 13%.  Ferritin 22.  B12 377.  Hemoglobin 10.1, platelets 384, Sickle cell screen negative  Patient says he has continued to have dark stools.  He had been constipated while taking anti-inflammatories.  Since discontinuing anti-inflammatories the constipation has resolved.  He never did require the Linzess.    There are 3 antibiotics on patient's home medication list including Keflex, metronidazole and Cipro.  Patient is unsure but does not think he is taking the Keflex.  He has a couple days left of metronidazole and Cipro.  These were apparently prescribed close to 10 days ago as a result of a telemedicine conversation with his PCP.  The best I can tell he was having ongoing RLQ pain and these antibiotics were for possible diverticulitis.  The RLQ pain has not improved with antibiotics.  Patient says this pain is chronic.  Past Medical History:  Diagnosis Date  . Arthritis   . Chronic diastolic heart failure ([place])    a. 10/2016 Echo: EF 55-60%, gr2 DD, triv AI, mod dil LA, mildly dil RV/RA.  [name]  Chronic low back pain with sciatica   . Constipation   . DM (diabetes mellitus) type II controlled peripheral vascular disorder   . GERD (gastroesophageal reflux disease)   . History of stress test    a. 04/2015 MV: EF 47%, mild basal inflat and mid inflat defect c/w ischemia. EF nl by echo @ that time and no further isch w/u pursued.  . Hyperlipidemia   . Hypertension   . Hypokalemia   . Microcytic anemia   . Moderate aortic stenosis    a. 10/2016 Echo: Mod AS.  . Numbness and tingling    feet bilat   . Obstructive sleep apnea     a. compliant w/ CPAP.  [name] Peripheral arterial disease ([place])   . Primary osteoarthritis of left knee   . Urinary incontinence   . Venous insufficiency    a. chronic LEE-->wound mgmt/wraps  . Venous stasis ulcer ([place])      Past Surgical History:  Procedure Laterality Date  . COLON RESECTION  2007  . COLON SURGERY    . TONSILLECTOMY    . TOTAL KNEE ARTHROPLASTY Left 05/31/2015   Procedure: TOTAL LEFT  KNEE ARTHROPLASTY;  Surgeon: Gaynelle Arabian, MD;  Location: [facility];  Service: Orthopedics;  Laterality: Left;  . TRANSTHORACIC ECHOCARDIOGRAM  07/02/2005   mod concentric LVH; LV borderline dilated; LA mild-mod dilated; mild MR & TR  . VARICOSE VEIN SURGERY     R leg   Family History  Problem Relation Age of Onset  . Pancreatic cancer Father   . Cancer Father   . Heart Problems Mother   . Hypertension Mother   . Diabetes Brother   . Hypertension Brother   . Colon cancer Neg Hx    Social History   Tobacco Use  . Smoking status: Former Smoker    Packs/day: 1.00    Years: 35.00    Pack years: 35.00    Types: Cigarettes    Quit date: 07/25/2005    Years since quitting: 14.1  . Smokeless tobacco: Never Used  Substance Use Topics  . Alcohol use: Yes    Alcohol/week: 0.5 standard drinks    Types: 1 Standard drinks or equivalent per week    Comment: rarely   . Drug use: No   Current Outpatient Medications  Medication Sig Dispense Refill  . Aspirin-Caffeine (BAYER BACK & BODY) 500-32.5 MG TABS Take 1 tablet by mouth daily.    [name] atorvastatin (LIPITOR) 40 MG tablet Take 40 mg by mouth every evening.  2  . carvedilol (COREG) 12.5 MG tablet Take 12.5 mg by mouth 2 (two) times daily with a meal.    . diazepam (VALIUM) 5 MG tablet Take 5 mg by mouth daily.   2  . diclofenac (VOLTAREN) 75 MG EC tablet Take 75 mg by mouth 2 (two) times daily.    [name] diltiazem (CARDIZEM CD) 360 MG 24 hr capsule Take 1 capsule (360 mg total) by mouth daily. 30 capsule 0  . folic acid (FOLVITE) 1 MG  tablet Take 1 mg by mouth daily.    [name] glimepiride (AMARYL) 4 MG tablet Take 4 mg by mouth daily before breakfast. Reported on 09/24/2015    . hydrALAZINE (APRESOLINE) 25 MG tablet TAKE 3 TABLETS BY MOUTH TWICE DAILY 540 tablet 0  . HYDROmorphone (DILAUDID) 4 MG tablet Take 4 mg by mouth every 6 (six) hours as needed for severe pain.    . hydrOXYzine (ATARAX/VISTARIL) 25 MG tablet Take 25 mg by mouth  2 (two) times daily as needed for itching.  2  . LEVEMIR FLEXTOUCH 100 UNIT/ML Pen Inject 20 Units into the skin at bedtime.  5  . lisinopril (PRINIVIL,ZESTRIL) 40 MG tablet Take 40 mg by mouth daily.  2  . metFORMIN (GLUCOPHAGE) 1000 MG tablet Take 1,000 mg by mouth 2 (two) times daily with a meal.    . methotrexate (RHEUMATREX) 2.5 MG tablet Take 10 mg by mouth once a week.    . Multiple Vitamin (MULTIVITAMIN WITH MINERALS) TABS tablet Take 1 tablet by mouth daily.    [name] PROAIR HFA 108 (90 Base) MCG/ACT inhaler Inhale 1-2 puffs into the lungs every 6 (six) hours as needed for wheezing or shortness of breath.    . spironolactone (ALDACTONE) 25 MG tablet Take 1 tablet (25 mg total) by mouth daily. 90 tablet 3  . tamsulosin (FLOMAX) 0.4 MG CAPS capsule Take 0.4 mg by mouth at bedtime.  5  . tolterodine (DETROL LA) 4 MG 24 hr capsule Take 4 mg by mouth daily.     [name] torsemide (DEMADEX) 20 MG tablet Take 2 tablets (40 mg total) by mouth 2 (two) times daily. 120 tablet 6  . triamcinolone cream (KENALOG) 0.1 % Apply 1 application topically daily as needed.  1   No current facility-administered medications for this visit.   No Known Allergies   Review of Systems: All  systems reviewed and negative except where noted in HPI.   Creatinine clearance cannot be calculated (Patient's most recent lab result is older than the maximum 21 days allowed.)   Physical Exam:    Wt Readings from Last 3 Encounters:  09/05/19 292 lb 6.4 oz (132.6 kg)  07/23/17 292 lb (132.5 kg)  07/02/17 288 lb (130.6 kg)    Temp  (!) 97.3 F (36.3 C)   Ht 6' (1.829 m)   Wt 292 lb 6.4 oz (132.6 kg)   BMI 39.66 kg/m  Constitutional:  Pleasant male in no acute distress. Psychiatric: Normal mood and affect. Behavior is normal. EENT: Pupils normal.  Conjunctivae are normal. No scleral icterus. Neck supple.  Cardiovascular: Normal rate, regular rhythm + murmur, 1+ BLE edema Pulmonary/chest: Effort normal and breath sounds normal. No wheezing, rales or rhonchi. Abdominal: Soft, nondistended, nontender. Bowel sounds active throughout. There are no masses palpable. No hepatomegaly. Neurological: Alert and oriented to person place and time. Skin: Skin is warm and dry. No rashes noted.  Tye Savoy, NP  09/05/2019, 11:27 AM  Cc:  Referring Provider Nolene Ebbs, MD

## 2019-09-05 NOTE — Progress Notes (Signed)
ASSESSMENT / PLAN:   69 year old male with PMH significant for chronic diastolic heart failure hypertension, hyperlipidemia, ? CKD, obesity, OSA, diabetes, moderate aortic stenosis, psoriasis, questionable history of PAF remote history of colon polyps, remote colovesicular fistula s/p segmental colectomy.   # Iron deficiency anemia, Hemoccult positive stool.  --Dark to black stools for several weeks, heme + at PCP's office --Ferritin 22.  Hemoglobin 10 in February, I do not more recent labs for review.  Will repeat CBC today. He has already been started on iron replacement. --For further evaluation of IDA patient will be scheduled for EGD and colonoscopy (last one was 2007 and incomplete).  The risks and benefits of EGD and colonoscopy with possible polypectomy / biopsies were discussed and the patient agrees to proceed.  --Patient has multiple medical problems but meets criteria for procedure to be done at the [facility] --Continue omeprazole 20 mg twice daily for now  #Chronic diastolic heart failure.   --LVEF 60 to 65%.  Moderate aortic stenosis   HPI:     Chief Complaint:  None. Blood found in stool.   History comes from chart and patient  Mario Mills is a 69 year old male with multiple medical problems.  He is on multiple medications including methotrexate ( ? Reason). He had a limited colonoscopy by Dr. Ardis Hughs in 2007 for evaluation of a colovesicular fistula for which he underwent a segmental colectomy.  Patient has not been seen here since.  He has not been followed by any other Gastroenterologist.   Patient is referred by PCP for evaluation of Hemoccult positive stool and anemia.  His stools have been dark to black over the last 1 to 2 months.  No bismuth use.  He did start iron but that was just a few weeks ago.  Patient saw his PCP on 08/16/2019 for evaluation of constipation, dark stool and abdominal discomfort.  PCPs note mentions epigastric pain, patient tells  me the pain was actually in the RLQ.  At any rate he was Hemoccult positive on PCPs exam that day.  Patient was started on omeprazole 20 mg twice daily.  Constipation he was prescribed Linzess. Patient believes he had labs drawn that day, I do not have those results.  I do have results from labs drawn 06/21/2019.  At that time BUN was 28, creatinine 1.63.  TIBC 277, iron saturation 13%.  Ferritin 22.  B12 377.  Hemoglobin 10.1, platelets 384, Sickle cell screen negative  Patient says he has continued to have dark stools.  He had been constipated while taking anti-inflammatories.  Since discontinuing anti-inflammatories the constipation has resolved.  He never did require the Linzess.    There are 3 antibiotics on patient's home medication list including Keflex, metronidazole and Cipro.  Patient is unsure but does not think he is taking the Keflex.  He has a couple days left of metronidazole and Cipro.  These were apparently prescribed close to 10 days ago as a result of a telemedicine conversation with his PCP.  The best I can tell he was having ongoing RLQ pain and these antibiotics were for possible diverticulitis.  The RLQ pain has not improved with antibiotics.  Patient says this pain is chronic.  Past Medical History:  Diagnosis Date  . Arthritis   . Chronic diastolic heart failure ([place])    a. 10/2016 Echo: EF 55-60%, gr2 DD, triv AI, mod dil LA, mildly dil RV/RA.  [name]  Chronic low back pain with sciatica   . Constipation   . DM (diabetes mellitus) type II controlled peripheral vascular disorder   . GERD (gastroesophageal reflux disease)   . History of stress test    a. 04/2015 MV: EF 47%, mild basal inflat and mid inflat defect c/w ischemia. EF nl by echo @ that time and no further isch w/u pursued.  . Hyperlipidemia   . Hypertension   . Hypokalemia   . Microcytic anemia   . Moderate aortic stenosis    a. 10/2016 Echo: Mod AS.  . Numbness and tingling    feet bilat   . Obstructive sleep apnea     a. compliant w/ CPAP.  [name] Peripheral arterial disease ([place])   . Primary osteoarthritis of left knee   . Urinary incontinence   . Venous insufficiency    a. chronic LEE-->wound mgmt/wraps  . Venous stasis ulcer ([place])      Past Surgical History:  Procedure Laterality Date  . COLON RESECTION  2007  . COLON SURGERY    . TONSILLECTOMY    . TOTAL KNEE ARTHROPLASTY Left 05/31/2015   Procedure: TOTAL LEFT  KNEE ARTHROPLASTY;  Surgeon: Gaynelle Arabian, MD;  Location: [facility]S;  Service: Orthopedics;  Laterality: Left;  . TRANSTHORACIC ECHOCARDIOGRAM  07/02/2005   mod concentric LVH; LV borderline dilated; LA mild-mod dilated; mild MR & TR  . VARICOSE VEIN SURGERY     R leg   Family History  Problem Relation Age of Onset  . Pancreatic cancer Father   . Cancer Father   . Heart Problems Mother   . Hypertension Mother   . Diabetes Brother   . Hypertension Brother   . Colon cancer Neg Hx    Social History   Tobacco Use  . Smoking status: Former Smoker    Packs/day: 1.00    Years: 35.00    Pack years: 35.00    Types: Cigarettes    Quit date: 07/25/2005    Years since quitting: 14.1  . Smokeless tobacco: Never Used  Substance Use Topics  . Alcohol use: Yes    Alcohol/week: 0.5 standard drinks    Types: 1 Standard drinks or equivalent per week    Comment: rarely   . Drug use: No   Current Outpatient Medications  Medication Sig Dispense Refill  . Aspirin-Caffeine (BAYER BACK & BODY) 500-32.5 MG TABS Take 1 tablet by mouth daily.    [name] atorvastatin (LIPITOR) 40 MG tablet Take 40 mg by mouth every evening.  2  . carvedilol (COREG) 12.5 MG tablet Take 12.5 mg by mouth 2 (two) times daily with a meal.    . diazepam (VALIUM) 5 MG tablet Take 5 mg by mouth daily.   2  . diclofenac (VOLTAREN) 75 MG EC tablet Take 75 mg by mouth 2 (two) times daily.    [name] diltiazem (CARDIZEM CD) 360 MG 24 hr capsule Take 1 capsule (360 mg total) by mouth daily. 30 capsule 0  . folic acid (FOLVITE) 1 MG  tablet Take 1 mg by mouth daily.    [name] glimepiride (AMARYL) 4 MG tablet Take 4 mg by mouth daily before breakfast. Reported on 09/24/2015    . hydrALAZINE (APRESOLINE) 25 MG tablet TAKE 3 TABLETS BY MOUTH TWICE DAILY 540 tablet 0  . HYDROmorphone (DILAUDID) 4 MG tablet Take 4 mg by mouth every 6 (six) hours as needed for severe pain.    . hydrOXYzine (ATARAX/VISTARIL) 25 MG tablet Take 25 mg by mouth  2 (two) times daily as needed for itching.  2  . LEVEMIR FLEXTOUCH 100 UNIT/ML Pen Inject 20 Units into the skin at bedtime.  5  . lisinopril (PRINIVIL,ZESTRIL) 40 MG tablet Take 40 mg by mouth daily.  2  . metFORMIN (GLUCOPHAGE) 1000 MG tablet Take 1,000 mg by mouth 2 (two) times daily with a meal.    . methotrexate (RHEUMATREX) 2.5 MG tablet Take 10 mg by mouth once a week.    . Multiple Vitamin (MULTIVITAMIN WITH MINERALS) TABS tablet Take 1 tablet by mouth daily.    [name] PROAIR HFA 108 (90 Base) MCG/ACT inhaler Inhale 1-2 puffs into the lungs every 6 (six) hours as needed for wheezing or shortness of breath.    . spironolactone (ALDACTONE) 25 MG tablet Take 1 tablet (25 mg total) by mouth daily. 90 tablet 3  . tamsulosin (FLOMAX) 0.4 MG CAPS capsule Take 0.4 mg by mouth at bedtime.  5  . tolterodine (DETROL LA) 4 MG 24 hr capsule Take 4 mg by mouth daily.     [name] torsemide (DEMADEX) 20 MG tablet Take 2 tablets (40 mg total) by mouth 2 (two) times daily. 120 tablet 6  . triamcinolone cream (KENALOG) 0.1 % Apply 1 application topically daily as needed.  1   No current facility-administered medications for this visit.   No Known Allergies   Review of Systems: All  systems reviewed and negative except where noted in HPI.   Creatinine clearance cannot be calculated (Patient's most recent lab result is older than the maximum 21 days allowed.)   Physical Exam:    Wt Readings from Last 3 Encounters:  09/05/19 292 lb 6.4 oz (132.6 kg)  07/23/17 292 lb (132.5 kg)  07/02/17 288 lb (130.6 kg)    Temp  (!) 97.3 F (36.3 C)   Ht 6' (1.829 m)   Wt 292 lb 6.4 oz (132.6 kg)   BMI 39.66 kg/m  Constitutional:  Pleasant male in no acute distress. Psychiatric: Normal mood and affect. Behavior is normal. EENT: Pupils normal.  Conjunctivae are normal. No scleral icterus. Neck supple.  Cardiovascular: Normal rate, regular rhythm + murmur, 1+ BLE edema Pulmonary/chest: Effort normal and breath sounds normal. No wheezing, rales or rhonchi. Abdominal: Soft, nondistended, nontender. Bowel sounds active throughout. There are no masses palpable. No hepatomegaly. Neurological: Alert and oriented to person place and time. Skin: Skin is warm and dry. No rashes noted.  Tye Savoy, NP  09/05/2019, 11:27 AM  Cc:  Referring Provider Nolene Ebbs, MD

## 2019-09-06 NOTE — Progress Notes (Signed)
I agree with the above note, plan 

## 2019-09-25 ENCOUNTER — Other Ambulatory Visit (HOSPITAL_COMMUNITY): Payer: Medicare HMO

## 2019-09-27 ENCOUNTER — Other Ambulatory Visit (HOSPITAL_COMMUNITY)
Admission: RE | Admit: 2019-09-27 | Discharge: 2019-09-27 | Disposition: A | Discharge status: Home / Self Care | Payer: Medicare HMO | Source: Ambulatory Visit | Attending: Gastroenterology | Admitting: Gastroenterology

## 2019-09-27 DIAGNOSIS — Z01812 Encounter for preprocedural laboratory examination: Secondary | ICD-10-CM | POA: Diagnosis present

## 2019-09-27 DIAGNOSIS — Z20822 Contact with and (suspected) exposure to covid-19: Secondary | ICD-10-CM | POA: Diagnosis not present

## 2019-09-27 LAB — SARS CORONAVIRUS 2 (TAT 6-24 HRS): SARS Coronavirus 2: NEGATIVE

## 2019-09-27 NOTE — Progress Notes (Signed)
Spoke with patient at 0900 today to confirm appointment with dR. Ardis Hughs. Informed pt he needed to go get his covid test today before 1530 at the [place] site. Pt agreed and stated he would go and get the covid test

## 2019-09-28 ENCOUNTER — Ambulatory Visit (HOSPITAL_COMMUNITY): Payer: Medicare HMO | Admitting: Anesthesiology

## 2019-09-28 ENCOUNTER — Ambulatory Visit (HOSPITAL_COMMUNITY)
Admission: RE | Admit: 2019-09-28 | Discharge: 2019-09-28 | Disposition: A | Discharge status: Home / Self Care | Payer: Medicare HMO | Attending: Gastroenterology | Admitting: Gastroenterology

## 2019-09-28 ENCOUNTER — Encounter (HOSPITAL_COMMUNITY)
Admission: RE | Disposition: A | Discharge status: Home / Self Care | Payer: Self-pay | Source: Home / Self Care | Attending: Gastroenterology

## 2019-09-28 ENCOUNTER — Encounter (HOSPITAL_COMMUNITY): Payer: Self-pay | Admitting: Gastroenterology

## 2019-09-28 ENCOUNTER — Other Ambulatory Visit: Payer: Self-pay

## 2019-09-28 DIAGNOSIS — E669 Obesity, unspecified: Secondary | ICD-10-CM | POA: Diagnosis not present

## 2019-09-28 DIAGNOSIS — D120 Benign neoplasm of cecum: Secondary | ICD-10-CM | POA: Insufficient documentation

## 2019-09-28 DIAGNOSIS — Z7982 Long term (current) use of aspirin: Secondary | ICD-10-CM | POA: Diagnosis not present

## 2019-09-28 DIAGNOSIS — K2970 Gastritis, unspecified, without bleeding: Secondary | ICD-10-CM | POA: Diagnosis not present

## 2019-09-28 DIAGNOSIS — Z79899 Other long term (current) drug therapy: Secondary | ICD-10-CM | POA: Diagnosis not present

## 2019-09-28 DIAGNOSIS — D123 Benign neoplasm of transverse colon: Secondary | ICD-10-CM | POA: Insufficient documentation

## 2019-09-28 DIAGNOSIS — D509 Iron deficiency anemia, unspecified: Secondary | ICD-10-CM | POA: Insufficient documentation

## 2019-09-28 DIAGNOSIS — K5730 Diverticulosis of large intestine without perforation or abscess without bleeding: Secondary | ICD-10-CM | POA: Insufficient documentation

## 2019-09-28 DIAGNOSIS — N189 Chronic kidney disease, unspecified: Secondary | ICD-10-CM | POA: Insufficient documentation

## 2019-09-28 DIAGNOSIS — K635 Polyp of colon: Secondary | ICD-10-CM | POA: Diagnosis not present

## 2019-09-28 DIAGNOSIS — I350 Nonrheumatic aortic (valve) stenosis: Secondary | ICD-10-CM | POA: Diagnosis not present

## 2019-09-28 DIAGNOSIS — Z96652 Presence of left artificial knee joint: Secondary | ICD-10-CM | POA: Insufficient documentation

## 2019-09-28 DIAGNOSIS — I130 Hypertensive heart and chronic kidney disease with heart failure and stage 1 through stage 4 chronic kidney disease, or unspecified chronic kidney disease: Secondary | ICD-10-CM | POA: Diagnosis not present

## 2019-09-28 DIAGNOSIS — Z794 Long term (current) use of insulin: Secondary | ICD-10-CM | POA: Insufficient documentation

## 2019-09-28 DIAGNOSIS — D124 Benign neoplasm of descending colon: Secondary | ICD-10-CM | POA: Diagnosis not present

## 2019-09-28 DIAGNOSIS — Z6839 Body mass index (BMI) 39.0-39.9, adult: Secondary | ICD-10-CM | POA: Insufficient documentation

## 2019-09-28 DIAGNOSIS — D122 Benign neoplasm of ascending colon: Secondary | ICD-10-CM | POA: Diagnosis not present

## 2019-09-28 DIAGNOSIS — Z8249 Family history of ischemic heart disease and other diseases of the circulatory system: Secondary | ICD-10-CM | POA: Insufficient documentation

## 2019-09-28 DIAGNOSIS — K2980 Duodenitis without bleeding: Secondary | ICD-10-CM | POA: Diagnosis not present

## 2019-09-28 DIAGNOSIS — M1712 Unilateral primary osteoarthritis, left knee: Secondary | ICD-10-CM | POA: Insufficient documentation

## 2019-09-28 DIAGNOSIS — K319 Disease of stomach and duodenum, unspecified: Secondary | ICD-10-CM | POA: Insufficient documentation

## 2019-09-28 DIAGNOSIS — G4733 Obstructive sleep apnea (adult) (pediatric): Secondary | ICD-10-CM | POA: Diagnosis not present

## 2019-09-28 DIAGNOSIS — Z87891 Personal history of nicotine dependence: Secondary | ICD-10-CM | POA: Insufficient documentation

## 2019-09-28 DIAGNOSIS — Z833 Family history of diabetes mellitus: Secondary | ICD-10-CM | POA: Insufficient documentation

## 2019-09-28 DIAGNOSIS — Z791 Long term (current) use of non-steroidal anti-inflammatories (NSAID): Secondary | ICD-10-CM | POA: Insufficient documentation

## 2019-09-28 DIAGNOSIS — E785 Hyperlipidemia, unspecified: Secondary | ICD-10-CM | POA: Diagnosis not present

## 2019-09-28 DIAGNOSIS — R195 Other fecal abnormalities: Secondary | ICD-10-CM

## 2019-09-28 DIAGNOSIS — E1122 Type 2 diabetes mellitus with diabetic chronic kidney disease: Secondary | ICD-10-CM | POA: Diagnosis not present

## 2019-09-28 DIAGNOSIS — E1151 Type 2 diabetes mellitus with diabetic peripheral angiopathy without gangrene: Secondary | ICD-10-CM | POA: Insufficient documentation

## 2019-09-28 DIAGNOSIS — I5032 Chronic diastolic (congestive) heart failure: Secondary | ICD-10-CM | POA: Insufficient documentation

## 2019-09-28 HISTORY — PX: ESOPHAGOGASTRODUODENOSCOPY (EGD) WITH PROPOFOL: SHX5813

## 2019-09-28 HISTORY — DX: Heart failure, unspecified: I50.9

## 2019-09-28 HISTORY — DX: Sleep apnea, unspecified: G47.30

## 2019-09-28 HISTORY — PX: COLONOSCOPY WITH PROPOFOL: SHX5780

## 2019-09-28 HISTORY — PX: BIOPSY: SHX5522

## 2019-09-28 HISTORY — PX: POLYPECTOMY: SHX5525

## 2019-09-28 LAB — GLUCOSE, CAPILLARY: Glucose-Capillary: 102 mg/dL — ABNORMAL HIGH (ref 70–99)

## 2019-09-28 SURGERY — COLONOSCOPY WITH PROPOFOL
Anesthesia: Monitor Anesthesia Care

## 2019-09-28 MED ORDER — PROPOFOL 500 MG/50ML IV EMUL
INTRAVENOUS | Status: AC
  Filled 2019-09-28: qty 50

## 2019-09-28 MED ORDER — ONDANSETRON HCL 4 MG/2ML IJ SOLN
INTRAMUSCULAR | Status: DC | PRN
  Administered 2019-09-28: 4 mg via INTRAVENOUS

## 2019-09-28 MED ORDER — PROPOFOL 500 MG/50ML IV EMUL
INTRAVENOUS | Status: DC | PRN
  Administered 2019-09-28: 75 ug/kg/min via INTRAVENOUS

## 2019-09-28 MED ORDER — PROPOFOL 10 MG/ML IV BOLUS
INTRAVENOUS | Status: DC | PRN
  Administered 2019-09-28: 30 mg via INTRAVENOUS
  Administered 2019-09-28: 20 mg via INTRAVENOUS

## 2019-09-28 MED ORDER — LACTATED RINGERS IV SOLN
INTRAVENOUS | Status: DC
  Administered 2019-09-28: 1000 mL via INTRAVENOUS

## 2019-09-28 MED ORDER — PROPOFOL 1000 MG/100ML IV EMUL
INTRAVENOUS | Status: AC
  Filled 2019-09-28: qty 100

## 2019-09-28 MED ORDER — SODIUM CHLORIDE 0.9 % IV SOLN: INTRAVENOUS | Status: DC

## 2019-09-28 SURGICAL SUPPLY — 25 items

## 2019-09-28 NOTE — Discharge Instructions (Signed)
YOU HAD AN ENDOSCOPIC PROCEDURE TODAY: Refer to the procedure report and other information in the discharge instructions given to you for any specific questions about what was found during the examination. If this information does not answer your questions, please call [place] office at [phone] to clarify.  ° °YOU SHOULD EXPECT: Some feelings of bloating in the abdomen. Passage of more gas than usual. Walking can help get rid of the air that was put into your GI tract during the procedure and reduce the bloating. If you had a lower endoscopy (such as a colonoscopy or flexible sigmoidoscopy) you may notice spotting of blood in your stool or on the toilet paper. Some abdominal soreness may be present for a day or two, also. ° °DIET: Your first meal following the procedure should be a light meal and then it is ok to progress to your normal diet. A half-sandwich or bowl of soup is an example of a good first meal. Heavy or fried foods are harder to digest and may make you feel nauseous or bloated. Drink plenty of fluids but you should avoid alcoholic beverages for 24 hours. If you had a esophageal dilation, please see attached instructions for diet.   ° °ACTIVITY: Your care partner should take you home directly after the procedure. You should plan to take it easy, moving slowly for the rest of the day. You can resume normal activity the day after the procedure however YOU SHOULD NOT DRIVE, use power tools, machinery or perform tasks that involve climbing or major physical exertion for 24 hours (because of the sedation medicines used during the test).  ° °SYMPTOMS TO REPORT IMMEDIATELY: °A gastroenterologist can be reached at any hour. Please call [phone]  for any of the following symptoms:  °Following lower endoscopy (colonoscopy, flexible sigmoidoscopy) °Excessive amounts of blood in the stool  °Significant tenderness, worsening of abdominal pains  °Swelling of the abdomen that is new, acute  °Fever of 100° or  higher  °Following upper endoscopy (EGD, EUS, ERCP, esophageal dilation) °Vomiting of blood or coffee ground material  °New, significant abdominal pain  °New, significant chest pain or pain under the shoulder blades  °Painful or persistently difficult swallowing  °New shortness of breath  °Black, tarry-looking or red, bloody stools ° °FOLLOW UP:  °If any biopsies were taken you will be contacted by phone or by letter within the next 1-3 weeks. Call [phone]  if you have not heard about the biopsies in 3 weeks.  °Please also call with any specific questions about appointments or follow up tests. ° °

## 2019-09-28 NOTE — Anesthesia Procedure Notes (Signed)
Procedure Name: MAC Date/Time: 09/28/2019 8:45 AM Performed by: Lollie Sails, CRNA Pre-anesthesia Checklist: Patient identified, Emergency Drugs available, Suction available, Patient being monitored and Timeout performed Oxygen Delivery Method: Nasal cannula

## 2019-09-28 NOTE — Transfer of Care (Signed)
Immediate Anesthesia Transfer of Care Note  Patient: BARNETT ELZEY  Procedure(s) Performed: COLONOSCOPY WITH PROPOFOL (N/A ) ESOPHAGOGASTRODUODENOSCOPY (EGD) WITH PROPOFOL (N/A ) POLYPECTOMY BIOPSY  Patient Location: PACU and Endoscopy Unit  Anesthesia Type:MAC  Level of Consciousness: awake and patient cooperative  Airway & Oxygen Therapy: Patient Spontanous Breathing and Patient connected to nasal cannula oxygen  Post-op Assessment: Report given to RN and Post -op Vital signs reviewed and stable  Post vital signs: Reviewed and stable  Last Vitals:  Vitals Value Taken Time  BP    Temp    Pulse 63 09/28/19 0923  Resp 22 09/28/19 0923  SpO2 100 % 09/28/19 0923  Vitals shown include unvalidated device data.  Last Pain:  Vitals:   09/28/19 0745  TempSrc: Oral         Complications: No apparent anesthesia complications

## 2019-09-28 NOTE — Interval H&P Note (Signed)
History and Physical Interval Note:  09/28/2019 7:38 AM  Mario Mills  has presented today for surgery, with the diagnosis of IDA, Heme + Stool.  The various methods of treatment have been discussed with the patient and family. After consideration of risks, benefits and other options for treatment, the patient has consented to  Procedure(s): COLONOSCOPY WITH PROPOFOL (N/A) ESOPHAGOGASTRODUODENOSCOPY (EGD) WITH PROPOFOL (N/A) as a surgical intervention.  The patient's history has been reviewed, patient examined, no change in status, stable for surgery.  I have reviewed the patient's chart and labs.  Questions were answered to the patient's satisfaction.     Milus Banister

## 2019-09-28 NOTE — Anesthesia Postprocedure Evaluation (Signed)
Anesthesia Post Note  Patient: Mario Mills  Procedure(s) Performed: COLONOSCOPY WITH PROPOFOL (N/A ) ESOPHAGOGASTRODUODENOSCOPY (EGD) WITH PROPOFOL (N/A ) POLYPECTOMY BIOPSY     Patient location during evaluation: PACU Anesthesia Type: MAC Level of consciousness: awake and alert Pain management: pain level controlled Vital Signs Assessment: post-procedure vital signs reviewed and stable Respiratory status: spontaneous breathing, nonlabored ventilation, respiratory function stable and patient connected to nasal cannula oxygen Cardiovascular status: stable and blood pressure returned to baseline Postop Assessment: no apparent nausea or vomiting Anesthetic complications: no    Last Vitals:  Vitals:   09/28/19 0941 09/28/19 0953  BP:  (!) 152/60  Pulse: (!) 53 (!) 52  Resp: 16 12  Temp:    SpO2: 96% 99%    Last Pain:  Vitals:   09/28/19 0953  TempSrc:   PainSc: 0-No pain                 Vonte Rossin DAVID

## 2019-09-28 NOTE — Anesthesia Preprocedure Evaluation (Signed)
Anesthesia Evaluation  Patient identified by MRN, date of birth, ID band Patient awake    Reviewed: Allergy & Precautions, NPO status , Patient's Chart, lab work & pertinent test results  Airway Mallampati: I  TM Distance: >3 FB Neck ROM: Full    Dental   Pulmonary sleep apnea , former smoker,    Pulmonary exam normal        Cardiovascular hypertension, Pt. on medications Normal cardiovascular exam+ Valvular Problems/Murmurs AS      Neuro/Psych    GI/Hepatic GERD  Medicated and Controlled,  Endo/Other  diabetes, Type 2  Renal/GU      Musculoskeletal   Abdominal   Peds  Hematology   Anesthesia Other Findings   Reproductive/Obstetrics                             Anesthesia Physical Anesthesia Plan  ASA: III  Anesthesia Plan: MAC   Post-op Pain Management:    Induction: Intravenous  PONV Risk Score and Plan: 1 and Ondansetron  Airway Management Planned: Nasal Cannula  Additional Equipment:   Intra-op Plan:   Post-operative Plan:   Informed Consent: I have reviewed the patients History and Physical, chart, labs and discussed the procedure including the risks, benefits and alternatives for the proposed anesthesia with the patient or authorized representative who has indicated his/her understanding and acceptance.       Plan Discussed with: CRNA and Surgeon  Anesthesia Plan Comments:         Anesthesia Quick Evaluation

## 2019-09-28 NOTE — Op Note (Addendum)
[facility] Patient Name: Mario Mills Procedure Date: 09/28/2019 MRN: [id number] Attending MD: Milus Banister , MD Date of Birth: February 24, 1951 CSN: [id number] Age: 69 Admit Type: Inpatient Procedure:                Colonoscopy Indications:              Iron deficiency anemia, heme +; [id number] complicated                            diverticular disease led to eventual sigmoid                            resection Providers:                Milus Banister, MD, Clyde Lundborg, RN, Carmie End, RN, Theodora Blow, Technician Referring MD:              Medicines:                Monitored Anesthesia Care Complications:            No immediate complications. Estimated blood loss:                            None. Estimated Blood Loss:     Estimated blood loss: none. Procedure:                Pre-Anesthesia Assessment:                           - Prior to the procedure, a History and Physical                            was performed, and patient medications and                            allergies were reviewed. The patient's tolerance of                            previous anesthesia was also reviewed. The risks                            and benefits of the procedure and the sedation                            options and risks were discussed with the patient.                            All questions were answered, and informed consent                            was obtained. Prior Anticoagulants: The patient has                            taken no previous anticoagulant  or antiplatelet                            agents. ASA Grade Assessment: IV - A patient with                            severe systemic disease that is a constant threat                            to life. After reviewing the risks and benefits,                            the patient was deemed in satisfactory condition to                            undergo the procedure.                 After obtaining informed consent, the colonoscope                            was passed under direct vision. Throughout the                            procedure, the patient's blood pressure, pulse, and                            oxygen saturations were monitored continuously. The                            [id number]) Olympus colonoscope was                            introduced through the anus and advanced to the the                            cecum, identified by appendiceal orifice and                            ileocecal valve. The colonoscopy was performed                            without difficulty. The patient tolerated the                            procedure well. The quality of the bowel                            preparation was good. The ileocecal valve,                            appendiceal orifice, and rectum were photographed. Scope In: 8:45:54 AM Scope Out: 9:02:31 AM Scope Withdrawal Time: 0 hours 10 minutes 41 seconds  Total Procedure Duration: 0 hours 16 minutes 37 seconds  Findings:      Four sessile polyps  were found in the descending colon, transverse       colon, ascending colon and cecum. The polyps were 3 to 12 mm in size.       These polyps were removed with a cold snare. Resection and retrieval       were complete.      Multiple small-mouthed diverticula were found in the left colon.      Normal appearing left sided end to side colo-colonic anastomosis.      The exam was otherwise without abnormality on direct and retroflexion       views. Impression:               - Four 3 to 12 mm polyps in the descending colon,                            in the transverse colon, in the ascending colon and                            in the cecum, removed with a cold snare. Resected                            and retrieved.                           - Diverticulosis in the left colon.                           - Normal appearing left sided end to  side                            colo-colonic anastomosis.                           - The examination was otherwise normal on direct                            and retroflexion views. Moderate Sedation:      Not Applicable - Patient had care per Anesthesia. Recommendation:           - EGD now.                           - Await pathology results. Procedure Code(s):        --- Professional ---                           [phone], Colonoscopy, flexible; with removal of                            tumor(s), polyp(s), or other lesion(s) by snare                            technique Diagnosis Code(s):        --- Professional ---                           K63.5, Polyp of colon  D50.9, Iron deficiency anemia, unspecified                           K57.30, Diverticulosis of large intestine without                            perforation or abscess without bleeding CPT copyright 2019 [employer]. All rights reserved. The codes documented in this report are preliminary and upon coder review may  be revised to meet current compliance requirements. Milus Banister, MD 09/28/2019 9:06:32 AM This report has been signed electronically. Number of Addenda: 0

## 2019-09-28 NOTE — Op Note (Addendum)
[facility] Patient Name: Mario Mills Procedure Date: 09/28/2019 MRN: [id number] Attending MD: Milus Banister , MD Date of Birth: [birth year] CSN: [id number] Age: 69 Admit Type: Outpatient Procedure:                Upper GI endoscopy Indications:              Iron deficiency anemia; FOB + stool Providers:                Milus Banister, MD, Clyde Lundborg, RN, Carmie End, RN, Theodora Blow, Technician Referring MD:              Medicines:                Monitored Anesthesia Care Complications:            No immediate complications. Estimated blood loss:                            None. Estimated Blood Loss:     Estimated blood loss: none. Procedure:                Pre-Anesthesia Assessment:                           - Prior to the procedure, a History and Physical                            was performed, and patient medications and                            allergies were reviewed. The patient's tolerance of                            previous anesthesia was also reviewed. The risks                            and benefits of the procedure and the sedation                            options and risks were discussed with the patient.                            All questions were answered, and informed consent                            was obtained. Prior Anticoagulants: The patient has                            taken no previous anticoagulant or antiplatelet                            agents. ASA Grade Assessment: IV - A patient with  severe systemic disease that is a constant threat                            to life. After reviewing the risks and benefits,                            the patient was deemed in satisfactory condition to                            undergo the procedure.                           After obtaining informed consent, the endoscope was                            passed under direct  vision. Throughout the                            procedure, the patient's blood pressure, pulse, and                            oxygen saturations were monitored continuously. The                            [id number]-[id number]) Olympus gastroscope was                            introduced through the mouth, and advanced to the                            second part of duodenum. The upper GI endoscopy was                            accomplished without difficulty. The patient                            tolerated the procedure well. Scope In: Scope Out: Findings:      Moderate inflammation characterized by erythema, friability and       granularity was found in the gastric antrum. Biopsies were taken with a       cold forceps for histology.      Edematous mucosa in just distal to the duodenal bulb over about 3-4cm.       This is likely peptic duodenitis but I biopsied to be exclude neoplasia..      The exam was otherwise without abnormality. Impression:               - Gastritis. Biopsied to check for H. pylori.                           - Edematous mucosa in just distal to the duodenal                            bulb over about 3-4cm. This is likely peptic  duodenitis but I biopsied to be exclude neoplasia..                           - The examination was otherwise normal. Moderate Sedation:      Not Applicable - Patient had care per Anesthesia. Recommendation:           - Patient has a contact number available for                            emergencies. The signs and symptoms of potential                            delayed complications were discussed with the                            patient. Return to normal activities tomorrow.                            Written discharge instructions were provided to the                            patient.                           - Resume previous diet.                           - Continue present medications.                            - Await pathology results. Will likely follow blood                            counts and consider further GI testing pending                            clinical course. Procedure Code(s):        --- Professional ---                           [phone], Esophagogastroduodenoscopy, flexible,                            transoral; with biopsy, single or multiple Diagnosis Code(s):        --- Professional ---                           K29.70, Gastritis, unspecified, without bleeding                           D50.9, Iron deficiency anemia, unspecified CPT copyright 2019 American Medical Association. All rights reserved. The codes documented in this report are preliminary and upon coder review may  be revised to meet current compliance requirements. Milus Banister, MD 09/28/2019 9:20:20 AM This report has been signed electronically. Number of Addenda: 0

## 2019-09-29 ENCOUNTER — Other Ambulatory Visit: Payer: Self-pay

## 2019-09-29 LAB — SURGICAL PATHOLOGY

## 2019-10-02 ENCOUNTER — Other Ambulatory Visit (HOSPITAL_COMMUNITY): Payer: Medicare HMO

## 2019-10-03 ENCOUNTER — Encounter: Payer: Self-pay | Admitting: *Deleted

## 2019-10-04 ENCOUNTER — Other Ambulatory Visit: Payer: Self-pay

## 2019-10-04 DIAGNOSIS — D509 Iron deficiency anemia, unspecified: Secondary | ICD-10-CM

## 2020-01-09 ENCOUNTER — Other Ambulatory Visit: Payer: Self-pay

## 2020-01-09 ENCOUNTER — Ambulatory Visit (HOSPITAL_COMMUNITY)
Admission: RE | Admit: 2020-01-09 | Discharge: 2020-01-09 | Disposition: A | Discharge status: Home / Self Care | Payer: Medicare HMO | Source: Ambulatory Visit | Attending: Internal Medicine | Admitting: Internal Medicine

## 2020-01-09 VITALS — BP 145/70 | HR 68 | Wt 296.4 lb

## 2020-01-09 DIAGNOSIS — Z794 Long term (current) use of insulin: Secondary | ICD-10-CM | POA: Insufficient documentation

## 2020-01-09 DIAGNOSIS — E1151 Type 2 diabetes mellitus with diabetic peripheral angiopathy without gangrene: Secondary | ICD-10-CM | POA: Diagnosis not present

## 2020-01-09 DIAGNOSIS — N183 Chronic kidney disease, stage 3 unspecified: Secondary | ICD-10-CM | POA: Diagnosis not present

## 2020-01-09 DIAGNOSIS — Z87891 Personal history of nicotine dependence: Secondary | ICD-10-CM | POA: Diagnosis not present

## 2020-01-09 DIAGNOSIS — G4733 Obstructive sleep apnea (adult) (pediatric): Secondary | ICD-10-CM | POA: Insufficient documentation

## 2020-01-09 DIAGNOSIS — Z79899 Other long term (current) drug therapy: Secondary | ICD-10-CM | POA: Diagnosis not present

## 2020-01-09 DIAGNOSIS — E1122 Type 2 diabetes mellitus with diabetic chronic kidney disease: Secondary | ICD-10-CM | POA: Diagnosis not present

## 2020-01-09 DIAGNOSIS — I130 Hypertensive heart and chronic kidney disease with heart failure and stage 1 through stage 4 chronic kidney disease, or unspecified chronic kidney disease: Secondary | ICD-10-CM | POA: Diagnosis not present

## 2020-01-09 DIAGNOSIS — I480 Paroxysmal atrial fibrillation: Secondary | ICD-10-CM | POA: Insufficient documentation

## 2020-01-09 DIAGNOSIS — I350 Nonrheumatic aortic (valve) stenosis: Secondary | ICD-10-CM

## 2020-01-09 DIAGNOSIS — K219 Gastro-esophageal reflux disease without esophagitis: Secondary | ICD-10-CM | POA: Diagnosis not present

## 2020-01-09 DIAGNOSIS — L409 Psoriasis, unspecified: Secondary | ICD-10-CM | POA: Insufficient documentation

## 2020-01-09 DIAGNOSIS — Z833 Family history of diabetes mellitus: Secondary | ICD-10-CM | POA: Diagnosis not present

## 2020-01-09 DIAGNOSIS — Z8249 Family history of ischemic heart disease and other diseases of the circulatory system: Secondary | ICD-10-CM | POA: Insufficient documentation

## 2020-01-09 DIAGNOSIS — I10 Essential (primary) hypertension: Secondary | ICD-10-CM

## 2020-01-09 DIAGNOSIS — Z96652 Presence of left artificial knee joint: Secondary | ICD-10-CM | POA: Insufficient documentation

## 2020-01-09 DIAGNOSIS — E785 Hyperlipidemia, unspecified: Secondary | ICD-10-CM | POA: Diagnosis not present

## 2020-01-09 DIAGNOSIS — I5032 Chronic diastolic (congestive) heart failure: Secondary | ICD-10-CM | POA: Diagnosis present

## 2020-01-09 DIAGNOSIS — I160 Hypertensive urgency: Secondary | ICD-10-CM | POA: Insufficient documentation

## 2020-01-09 DIAGNOSIS — E669 Obesity, unspecified: Secondary | ICD-10-CM | POA: Diagnosis not present

## 2020-01-09 MED ORDER — HYDRALAZINE HCL 100 MG PO TABS: 100.0000 mg | ORAL_TABLET | Freq: Two times a day (BID) | ORAL | 9 refills | Status: DC

## 2020-01-09 NOTE — Addendum Note (Signed)
Encounter addended by: Scarlette Calico, RN on: 01/09/2020 12:21 PM  Actions taken: Visit diagnoses modified, Order list changed, Diagnosis association updated

## 2020-01-09 NOTE — Addendum Note (Signed)
Encounter addended by: Malena Edman, RN on: 01/09/2020 11:55 AM  Actions taken: Pharmacy for encounter modified, Order list changed, Diagnosis association updated, Clinical Note Signed

## 2020-01-09 NOTE — Patient Instructions (Signed)
Congratulations, you have graduated the [facility]!   Increase your Hydralazine to 100mg  twice daily   Your physician has requested that you have an echocardiogram. Echocardiography is a painless test that uses sound waves to create images of your heart. It provides your doctor with information about the size and shape of your heart and how well your heart's chambers and valves are working. This procedure takes approximately one hour. There are no restrictions for this procedure.    You have been referred to Dr. Kathlene Cote. They will contact you to schedule an appointment  If you have any questions or concerns before your next appointment please send [place] a message through [place] or call our office at [phone].    TO LEAVE A MESSAGE FOR THE NURSE SELECT OPTION 2, PLEASE LEAVE A MESSAGE INCLUDING: . YOUR NAME . DATE OF BIRTH . CALL BACK NUMBER . REASON FOR CALL**this is important as we prioritize the call backs  [place] AS LONG AS YOU CALL BEFORE 4:00 PM   At the [facility], you and your health needs are our priority. As part of our continuing mission to provide you with exceptional heart care, we have created designated Provider Care Teams. These Care Teams include your primary Cardiologist (physician) and Advanced Practice Providers (APPs- Physician Assistants and Nurse Practitioners) who all work together to provide you with the care you need, when you need it.   You may see any of the following providers on your designated Care Team at your next follow up: [name] Dr Glori Bickers . Dr Loralie Champagne . Darrick Grinder, NP . Lyda Jester, PA . Audry Riles, PharmD   Please be sure to bring in all your medications bottles to every appointment.

## 2020-01-09 NOTE — Addendum Note (Signed)
Encounter addended by: Jolaine Artist, MD on: 01/09/2020 11:47 AM  Actions taken: Clinical Note Signed, Level of Service modified, Visit diagnoses modified

## 2020-01-09 NOTE — Progress Notes (Signed)
Advanced Heart Failure Clinic Note  Date:  01/09/2020   ID:  LYMAN BALINGIT, DOB 30-Mar-1951, MRN [id number]  Location: Home  Provider location: [facility] Type of Visit: Established patient  PCP:  Nolene Ebbs, MD  Cardiologist:  Quay Burow, MD Primary HF: Keianna Signer  Chief Complaint: Heart Failure follow-up   History of Present Illness:  Sevrin Sally Elmoreis a 69 y.o.malewith a prior history of HFpEF, hypertension, hyperlipidemia, obesity,OSA,  diabetes, GERD, chronic venous stasis (followed in [facility]ymphedema [facility]), moderate aortic stenosis, psoriasis and questionable h/o paroxysmal atrial fibrillation.  Admitted to [facility] on 05/23/2017 with progressive SOB. Found to have marked volume overload despite adjustment of outpatient diuretics. Pt also noted to have hypertensive urgency with systolic BP >774 on arrival. There was some question about compliance at home. Overall pt diuresed 28 lbs down. Discharged on 1/9. Weight on d/c 292. Discharged on torsemide 40/20.   Today he presents for routine f/u. Says he feels pretty good. Gets around with a cane. Takes torsemide about 1-2x week. Mild DOE with walking. Wears CPAP every night. No orthopnea, PND or edema.   Echo 05/24/2017  EF 60-65% Normal RV  Moderate AS mean gradient 34 mm hg  [name] denies symptoms worrisome for COVID 19.   Past Medical History:  Diagnosis Date  . Arthritis   . CHF (congestive heart failure) ([place])   . Chronic diastolic heart failure ([place])    a. 10/2016 Echo: EF 55-60%, gr2 DD, triv AI, mod dil LA, mildly dil RV/RA.  [name] Chronic low back pain with sciatica   . Constipation   . DM (diabetes mellitus) type II controlled peripheral vascular disorder   . GERD (gastroesophageal reflux disease)   . History of stress test    a. 04/2015 MV: EF 47%, mild basal inflat and mid inflat defect c/w ischemia. EF nl by echo @ that time and no further isch w/u pursued.  .  Hyperlipidemia   . Hypertension   . Hypokalemia   . Microcytic anemia   . Moderate aortic stenosis    a. 10/2016 Echo: Mod AS.  . Numbness and tingling    feet bilat   . Peripheral arterial disease ([place])   . Primary osteoarthritis of left knee   . Sleep apnea   . Urinary incontinence   . Venous insufficiency    a. chronic LEE-->wound mgmt/wraps  . Venous stasis ulcer ([place])    Past Surgical History:  Procedure Laterality Date  . BIOPSY  09/28/2019   Procedure: BIOPSY;  Surgeon: Milus Banister, MD;  Location: [facility];  Service: Endoscopy;;  . COLON RESECTION  2007  . COLON SURGERY    . COLONOSCOPY WITH PROPOFOL N/A 09/28/2019   Procedure: COLONOSCOPY WITH PROPOFOL;  Surgeon: Milus Banister, MD;  Location: [facility];  Service: Endoscopy;  Laterality: N/A;  . ESOPHAGOGASTRODUODENOSCOPY (EGD) WITH PROPOFOL N/A 09/28/2019   Procedure: ESOPHAGOGASTRODUODENOSCOPY (EGD) WITH PROPOFOL;  Surgeon: Milus Banister, MD;  Location: [facility];  Service: Endoscopy;  Laterality: N/A;  . POLYPECTOMY  09/28/2019   Procedure: POLYPECTOMY;  Surgeon: Milus Banister, MD;  Location: [facility];  Service: Endoscopy;;  . TONSILLECTOMY    . TOTAL KNEE ARTHROPLASTY Left 05/31/2015   Procedure: TOTAL LEFT  KNEE ARTHROPLASTY;  Surgeon: Gaynelle Arabian, MD;  Location: [facility] ORS;  Service: Orthopedics;  Laterality: Left;  . TRANSTHORACIC ECHOCARDIOGRAM  07/02/2005   mod concentric LVH; LV borderline dilated; LA mild-mod dilated; mild  MR & TR  . VARICOSE VEIN SURGERY     R leg     Current Outpatient Medications  Medication Sig Dispense Refill  . acetaminophen (TYLENOL) 500 MG tablet Take 1,000 mg by mouth in the morning and at bedtime.    [name] atorvastatin (LIPITOR) 40 MG tablet Take 40 mg by mouth every evening.  2  . carvedilol (COREG) 12.5 MG tablet Take 12.5 mg by mouth 2 (two) times daily with a meal.    . cetirizine (ZYRTEC) 10 MG tablet Take 10 mg by mouth daily.    . folic acid (FOLVITE) 1 MG  tablet Take 1 mg by mouth daily.    . hydrALAZINE (APRESOLINE) 25 MG tablet TAKE 3 TABLETS BY MOUTH TWICE DAILY 540 tablet 0  . LEVEMIR FLEXTOUCH 100 UNIT/ML Pen Inject 20 Units into the skin at bedtime.  5  . lisinopril (PRINIVIL,ZESTRIL) 40 MG tablet Take 40 mg by mouth daily.  2  . metFORMIN (GLUCOPHAGE) 1000 MG tablet Take 1,000 mg by mouth 2 (two) times daily with a meal.    . Multiple Vitamin (MULTIVITAMIN WITH MINERALS) TABS tablet Take 1 tablet by mouth daily.    . potassium chloride SA (KLOR-CON) 20 MEQ tablet Take 20 mEq by mouth 2 (two) times daily as needed (Takes with torsemide).    [name] PROAIR HFA 108 (90 Base) MCG/ACT inhaler Inhale 1-2 puffs into the lungs every 6 (six) hours as needed for wheezing or shortness of breath.    . spironolactone (ALDACTONE) 25 MG tablet Take 1 tablet (25 mg total) by mouth daily. 90 tablet 3  . TIADYLT ER 360 MG 24 hr capsule Take 360 mg by mouth daily.     [name] torsemide (DEMADEX) 20 MG tablet Take 2 tablets (40 mg total) by mouth 2 (two) times daily. 120 tablet 6  . triamcinolone cream (KENALOG) 0.1 % Apply 1 application topically daily as needed (irritation).   1   No current facility-administered medications for this encounter.    Allergies:   Patient has no known allergies.   Social History:  The patient  reports that he quit smoking about 14 years ago. His smoking use included cigarettes. He has a 35.00 pack-year smoking history. He has never used smokeless tobacco. He reports current alcohol use of about 0.5 standard drinks of alcohol per week. He reports that he does not use drugs.   Family History:  The patient's family history includes Diabetes in his brother; Heart Problems in his mother; Hypertension in his brother and mother; Pancreatic cancer in his father.   ROS:  Please see the history of present illness.   All other systems are personally reviewed and negative.   Vitals:   01/09/20 1106  BP: (!) 145/70  Pulse: 68  SpO2: 98%    Weight: 134.4 kg (296 lb 6.4 oz)     Exam:   General:  Well appearing. No resp difficulty HEENT: normal Neck: supple. no JVD. Carotids 2+ bilat; + bruits. No lymphadenopathy or thryomegaly appreciated. Cor: PMI nondisplaced. Regular rate & rhythm.2/6 AS s2 significantly decreased  Lungs: clear Abdomen: obese soft, nontender, nondistended. No hepatosplenomegaly. No bruits or masses. Good bowel sounds. Extremities: no cyanosis, clubbing, rash, tr-1+ edema Neuro: alert & orientedx3, cranial nerves grossly intact. moves all 4 extremities w/o difficulty. Affect pleasant  ECG: NSR 63 nonspecific TW changes Personally reviewed   Recent Labs: 09/05/2019: Hemoglobin 11.4; Platelets 307.0  Personally reviewed   Wt Readings from Last 3 Encounters:  01/09/20  134.4 kg (296 lb 6.4 oz)  09/28/19 125.6 kg (277 lb)  09/05/19 132.6 kg (292 lb 6.4 oz)      ASSESSMENT AND PLAN:  1. Chronic diastolic congestive heart failure - Echo 05/24/17 LVEF 60-65% Moderate AS. RV ok  - Stable NYHA II-III - Volume status stable takes torsemide as needed - Can graduate from HF Clinic  2. Aortic stenosis - Moderate by echo 05/24/17 mean gradient 78mmHg. Continue to follow - Will order repeat echo - F/u with Dr. Gwenlyn Found. Will likely need TAVR down the road  3. Hypertension, severe - BP improved but not at goal yet  - Continue coreg 12.5 mg BID - Continue diltiazem360mg  daily.  - Continue spiro 25 mg daily. - Continue lisinopril 40 mg daily -Increase hydralazine to 100 mg BID. 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 No mention of RAS.   4. Chronic venous insufficiency:  - He was followed by lymphedema clinic as well as vascular surgery and wound clinic at [facility].N - Now much improved/resolved  5. Obstructive sleep apnea:  - Compliant with CPAP  6. Type 2 diabetes mellitus:  - Consider SGLT2i   7. ? PAF - not confirmed.  If we can get  confirmation will need AC.   8. PAD - followed at [facility]. Has bilateral LE disease - continue ASA, statin - no claudication  9. CKD Stage III - Creatinine baseline 1.5-1.7 - Had recent labs with Dr. Pierce Crane - Consider SGLT2i   Signed, Glori Bickers, MD  01/09/2020 11:33 AM  [facility] [address] [place] [address] [phone] (office) [phone] (fax)

## 2020-01-17 ENCOUNTER — Other Ambulatory Visit (HOSPITAL_COMMUNITY): Payer: Self-pay | Admitting: Internal Medicine

## 2020-01-23 ENCOUNTER — Other Ambulatory Visit: Payer: Self-pay

## 2020-01-23 ENCOUNTER — Ambulatory Visit (HOSPITAL_COMMUNITY)
Admission: RE | Admit: 2020-01-23 | Discharge: 2020-01-23 | Disposition: A | Discharge status: Home / Self Care | Payer: Medicare HMO | Source: Ambulatory Visit | Attending: Internal Medicine | Admitting: Internal Medicine

## 2020-01-23 DIAGNOSIS — I4891 Unspecified atrial fibrillation: Secondary | ICD-10-CM | POA: Insufficient documentation

## 2020-01-23 DIAGNOSIS — I110 Hypertensive heart disease with heart failure: Secondary | ICD-10-CM | POA: Diagnosis not present

## 2020-01-23 DIAGNOSIS — G4730 Sleep apnea, unspecified: Secondary | ICD-10-CM | POA: Insufficient documentation

## 2020-01-23 DIAGNOSIS — I5032 Chronic diastolic (congestive) heart failure: Secondary | ICD-10-CM | POA: Diagnosis present

## 2020-01-23 DIAGNOSIS — I352 Nonrheumatic aortic (valve) stenosis with insufficiency: Secondary | ICD-10-CM | POA: Insufficient documentation

## 2020-01-23 DIAGNOSIS — E119 Type 2 diabetes mellitus without complications: Secondary | ICD-10-CM | POA: Diagnosis not present

## 2020-01-23 LAB — ECHOCARDIOGRAM COMPLETE
AR max vel: 1.31 cm2
AV Area VTI: 1.23 cm2
AV Area mean vel: 1.23 cm2
AV Mean grad: 42 mmHg
AV Peak grad: 74.3 mmHg
Ao pk vel: 4.31 m/s
Area-P 1/2: 2.24 cm2
Calc EF: 84.1 %
S' Lateral: 3.6 cm
Single Plane A2C EF: 81.4 %
Single Plane A4C EF: 85.5 %

## 2020-01-23 NOTE — Progress Notes (Signed)
  Echocardiogram 2D Echocardiogram has been performed.  Mario Mills 01/23/2020, 2:04 PM

## 2020-03-05 ENCOUNTER — Ambulatory Visit: Payer: Medicare HMO | Admitting: Cardiovascular Disease

## 2020-06-03 ENCOUNTER — Telehealth: Payer: Self-pay | Admitting: Cardiovascular Disease

## 2020-06-03 NOTE — Telephone Encounter (Signed)
Spoke with patient to reschedule 06/03/20 appointment with Dr. Gardiner Ramus to 06/14/20 at 4:00pm---patient voiced his understanding.

## 2020-06-04 ENCOUNTER — Ambulatory Visit: Payer: Medicare HMO | Admitting: Cardiovascular Disease

## 2020-06-14 ENCOUNTER — Ambulatory Visit (INDEPENDENT_AMBULATORY_CARE_PROVIDER_SITE_OTHER): Payer: Medicare HMO | Admitting: Cardiovascular Disease

## 2020-06-14 ENCOUNTER — Other Ambulatory Visit: Payer: Self-pay

## 2020-06-14 ENCOUNTER — Encounter: Payer: Self-pay | Admitting: Cardiovascular Disease

## 2020-06-14 VITALS — BP 130/60 | HR 90 | Ht 72.0 in | Wt 311.0 lb

## 2020-06-14 DIAGNOSIS — I83019 Varicose veins of right lower extremity with ulcer of unspecified site: Secondary | ICD-10-CM | POA: Diagnosis not present

## 2020-06-14 DIAGNOSIS — I5033 Acute on chronic diastolic (congestive) heart failure: Secondary | ICD-10-CM

## 2020-06-14 DIAGNOSIS — R0989 Other specified symptoms and signs involving the circulatory and respiratory systems: Secondary | ICD-10-CM

## 2020-06-14 DIAGNOSIS — L97929 Non-pressure chronic ulcer of unspecified part of left lower leg with unspecified severity: Secondary | ICD-10-CM

## 2020-06-14 DIAGNOSIS — I350 Nonrheumatic aortic (valve) stenosis: Secondary | ICD-10-CM | POA: Diagnosis not present

## 2020-06-14 DIAGNOSIS — L97919 Non-pressure chronic ulcer of unspecified part of right lower leg with unspecified severity: Secondary | ICD-10-CM

## 2020-06-14 DIAGNOSIS — I83029 Varicose veins of left lower extremity with ulcer of unspecified site: Secondary | ICD-10-CM

## 2020-06-14 DIAGNOSIS — G4733 Obstructive sleep apnea (adult) (pediatric): Secondary | ICD-10-CM

## 2020-06-14 DIAGNOSIS — I480 Paroxysmal atrial fibrillation: Secondary | ICD-10-CM

## 2020-06-14 DIAGNOSIS — I10 Essential (primary) hypertension: Secondary | ICD-10-CM | POA: Diagnosis not present

## 2020-06-14 NOTE — Patient Instructions (Addendum)
Medication Instructions:  Your physician recommends that you continue on your current medications as directed. Please refer to the Current Medication list given to you today.  *If you need a refill on your cardiac medications before your next appointment, please call your pharmacy*   Testing/Procedures: Your physician has requested that you have an echocardiogram. Echocardiography is a painless test that uses sound waves to create images of your heart. It provides your doctor with information about the size and shape of your heart and how well your heart's chambers and valves are working. This procedure takes approximately one hour. There are no restrictions for this procedure. This is done at [address] 3rd Floor. To be done in September 2022.  Your physician has requested that you have a carotid duplex. This test is an ultrasound of the carotid arteries in your neck. It looks at blood flow through these arteries that supply the brain with blood. Allow one hour for this exam. There are no restrictions or special instructions. This procedure is done at [facility]. 2nd Floor.   Follow-Up: At [facility], you and your health needs are our priority.  As part of our continuing mission to provide you with exceptional heart care, we have created designated Provider Care Teams.  These Care Teams include your primary Cardiologist (physician) and Advanced Practice Providers (APPs -  Physician Assistants and Nurse Practitioners) who all work together to provide you with the care you need, when you need it.  We recommend signing up for the patient portal called "MyChart".  Sign up information is provided on this After Visit Summary.  MyChart is used to connect with patients for Virtual Visits (Telemedicine).  Patients are able to view lab/test results, encounter notes, upcoming appointments, etc.  Non-urgent messages can be sent to your provider as well.   To learn more about what you can do  with MyChart, go to [email or web address].    Your next appointment:   6 month(s)  The format for your next appointment:   In Person  Provider:   You will see one of the following Advanced Practice Providers on your designated Care Team:    Kerin Ransom, PA-C  [place], [place]  Coletta Memos, [place]  Then, Quay Burow, MD will plan to see you again in 12 month(s).

## 2020-06-14 NOTE — Progress Notes (Signed)
06/14/2020 Mario Mills   [birth year]  [id number]  Primary Physician Nolene Ebbs, MD Primary Cardiologist: Lorretta Harp MD FACP, [place], [place], [place]  HPI:  Mario Mills is a 70 y.o.  severely overweight, married [place] American male with a history of hypertension, hyperlipidemia, noninsulin-requiring diabetes and obstructive sleep apnea on CPAP. I last saw him in the office  04/20/2017.Mario Mills He stopped smoking 9 years ago. I saw him last March 04, 2007. He successfully underwent colon resection after a normal 2D echo and Cardiolite stress test at that time. He is otherwise asymptomatic except for spinal stenosis. He's been stable since except for a poorly healing left pretibial venous stasis ulcer thought that Dr. Judene Companion at the [facility] wound care clinic. Arterial and venous Dopplers were obtained our office on 12/16/12 revealing a right ankle regular neck supple 95 and a left of 0.73 with a high-frequency signal in his left SFA though it is hard to elicit a clear history of claudication. Venous Dopplers to suggest venous reflux on the left. 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 He is scheduled for left total knee replacement in January. He denies chest pain but has chronic dyspnea on exertion. He had a right total knee replacement by Dr. Reynaldo Mills in January. Prior to that he had a 2-D echo which was normal and Myoview stress test that showed very mild lateral ischemia which I thought was relatively low risk. He had a somewhat protracted postoperative course with hypertensive urgency and volume overload. He was kept in place after that. He has now ambulatory and relatively asymptomatic.  He was admitted to [facility] 04/11/17 with volume overload and diastolic heart failure, and again in 2019.  He does see Dr. Haroldine Mills in the advanced heart failure clinic.Mario Mills He was diuresed. His diuretics were adjusted. Does admit to dietary indiscretion with regards to salt. 2-D  echo performed 11/13/16 revealed normal LV systolic function with moderate LVH and moderate aortic stenosis.  Since I saw him almost 3 years ago and Dr. Haroldine Mills for 5 months ago he is remained stable.  He did have a 2D echo performed 01/23/2020 revealed hyperdynamic LV function with severe aortic stenosis and a mean gradient of 42 mmHg.  He does have chronic diastolic heart failure on oral diuretics as well as venous stasis ulcers.   Current Meds  Medication Sig  . acetaminophen (TYLENOL) 500 MG tablet Take 1,000 mg by mouth in the morning and at bedtime.  Mario Mills atorvastatin (LIPITOR) 40 MG tablet Take 40 mg by mouth every evening.  . carvedilol (COREG) 12.5 MG tablet Take 12.5 mg by mouth 2 (two) times daily with a meal.  . cetirizine (ZYRTEC) 10 MG tablet Take 10 mg by mouth daily.  . folic acid (FOLVITE) 1 MG tablet Take 1 mg by mouth daily.  . hydrALAZINE (APRESOLINE) 100 MG tablet Take 1 tablet (100 mg total) by mouth 2 (two) times daily.  Mario Mills LEVEMIR FLEXTOUCH 100 UNIT/ML Pen Inject 20 Units into the skin at bedtime.  Mario Mills lisinopril (PRINIVIL,ZESTRIL) 40 MG tablet Take 40 mg by mouth daily.  . metFORMIN (GLUCOPHAGE) 1000 MG tablet Take 1,000 mg by mouth 2 (two) times daily with a meal.  . Multiple Vitamin (MULTIVITAMIN WITH MINERALS) TABS tablet Take 1 tablet by mouth daily.  . potassium chloride SA (KLOR-CON) 20 MEQ tablet Take 20 mEq by mouth 2 (two) times daily as needed (Takes with torsemide).  Mario Mills PROAIR HFA 108 (90  Base) MCG/ACT inhaler Inhale 1-2 puffs into the lungs every 6 (six) hours as needed for wheezing or shortness of breath.  . spironolactone (ALDACTONE) 25 MG tablet TAKE 1 TABLET BY MOUTH EVERY DAY  . TIADYLT ER 360 MG 24 hr capsule Take 360 mg by mouth daily.   Mario Mills torsemide (DEMADEX) 20 MG tablet Take 2 tablets (40 mg total) by mouth 2 (two) times daily.  Mario Mills triamcinolone cream (KENALOG) 0.1 % Apply 1 application topically daily as needed (irritation).      No Known  Allergies  Social History   Socioeconomic History  . Marital status: Married    Spouse name: Not on file  . Number of children: 2  . Years of education: post grad  . Highest education level: Not on file  Occupational History  . 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 Smoking status: Former Smoker    Packs/day: 1.00    Years: 35.00    Pack years: 35.00    Types: Cigarettes    Quit date: 07/25/2005    Years since quitting: 14.8  . Smokeless tobacco: Never Used  Vaping Use  . Vaping Use: Never used  Substance and Sexual Activity  . Alcohol use: Yes    Alcohol/week: 0.5 standard drinks    Types: 1 Standard drinks or equivalent per week    Comment: rarely   . Drug use: No  . Sexual activity: Not on file  Other Topics Concern  . Not on file  Social History Narrative  . Not on file   Social Determinants of Health   Financial Resource Strain: Not on file  Food Insecurity: Not on file  Transportation Needs: Not on file  Physical Activity: Not on file  Stress: Not on file  Social Connections: Not on file  Intimate Partner Violence: Not on file     Review of Systems: General: negative for chills, fever, night sweats or weight changes.  Cardiovascular: negative for chest pain, dyspnea on exertion, edema, orthopnea, palpitations, paroxysmal nocturnal dyspnea or shortness of breath Dermatological: negative for rash Respiratory: negative for cough or wheezing Urologic: negative for hematuria Abdominal: negative for nausea, vomiting, diarrhea, bright red blood per rectum, melena, or hematemesis Neurologic: negative for visual changes, syncope, or dizziness All other systems reviewed and are otherwise negative except as noted above.    Blood pressure 130/60, pulse 90, height 6' (1.829 m), weight (!) 311 lb (141.1 kg).  General appearance: alert and no distress Neck: no adenopathy, no JVD, supple, symmetrical, trachea midline,  thyroid not enlarged, symmetric, no tenderness/mass/nodules and Bilateral carotid bruits Lungs: clear to auscultation bilaterally Heart: 2/6 systolic ejection murmur at the base consistent with aortic stenosis Extremities: extremities normal, atraumatic, no cyanosis or edema Pulses: 2+ and symmetric Skin: Skin color, texture, turgor normal. No rashes or lesions or Venous stasis changes with venous stasis ulcer Neurologic: Alert and oriented X 3, normal strength and tone. Normal symmetric reflexes. Normal coordination and gait  EKG not performed today  ASSESSMENT AND PLAN:   Venous stasis ulcers of both lower extremities (HCC) Venous stasis ulcer on the left followed by Dr. Melony Overly , his podiatrist.  He was recently referred to the wound care center.  Essential hypertension History of essential hypertension a blood pressure measured today at 130/70.  He is on carvedilol, and hydralazine as well as lisinopril.  Acute on chronic diastolic (congestive) heart failure (HCC) Acute on chronic diastolic heart failure on spironolactone  daily and torsemide several days a week.  He is aware of salt restriction.  He has minimal peripheral edema.  He has gained 20 pounds in the last 8 months.  He denies shortness of breath.  Paroxysmal atrial fibrillation (HCC) History of PAF but not confirmed and not on oral anticoagulation.  His last EKG performed August 24 of last year showed sinus rhythm.  Aortic stenosis Severe aortic stenosis with progression according to recent echo performed 01/23/2020 with a mean gradient of 42 and normal LV systolic function.  He is currently minimally symptomatic.  We will continue to follow this on a annual basis.      Lorretta Harp MD FACP,FACC,FAHA, [facility] 06/14/2020 4:16 PM

## 2020-06-14 NOTE — Assessment & Plan Note (Signed)
History of essential hypertension a blood pressure measured today at 130/70.  He is on carvedilol, and hydralazine as well as lisinopril.

## 2020-06-14 NOTE — Assessment & Plan Note (Signed)
Severe aortic stenosis with progression according to recent echo performed 01/23/2020 with a mean gradient of 42 and normal LV systolic function.  He is currently minimally symptomatic.  We will continue to follow this on a annual basis.

## 2020-06-14 NOTE — Assessment & Plan Note (Signed)
Venous stasis ulcer on the left followed by Dr. Melony Overly , his podiatrist.  He was recently referred to the wound care center.

## 2020-06-14 NOTE — Assessment & Plan Note (Signed)
Acute on chronic diastolic heart failure on spironolactone daily and torsemide several days a week.  He is aware of salt restriction.  He has minimal peripheral edema.  He has gained 20 pounds in the last 8 months.  He denies shortness of breath.

## 2020-06-14 NOTE — Assessment & Plan Note (Signed)
History of obstructive sleep apnea compliant with CPAP 

## 2020-06-14 NOTE — Assessment & Plan Note (Signed)
History of PAF but not confirmed and not on oral anticoagulation.  His last EKG performed August 24 of last year showed sinus rhythm.

## 2020-06-25 ENCOUNTER — Other Ambulatory Visit: Payer: Self-pay

## 2020-06-25 ENCOUNTER — Ambulatory Visit (HOSPITAL_COMMUNITY)
Admission: RE | Admit: 2020-06-25 | Discharge: 2020-06-25 | Disposition: A | Discharge status: Home / Self Care | Payer: Medicare HMO | Source: Ambulatory Visit | Attending: Cardiovascular Disease | Admitting: Cardiovascular Disease

## 2020-06-25 DIAGNOSIS — R0989 Other specified symptoms and signs involving the circulatory and respiratory systems: Secondary | ICD-10-CM | POA: Diagnosis not present

## 2021-01-15 ENCOUNTER — Other Ambulatory Visit (HOSPITAL_COMMUNITY): Payer: Self-pay | Admitting: Internal Medicine

## 2021-01-17 NOTE — Telephone Encounter (Signed)
Pt now sees Dr. Gwenlyn Found. Please address

## 2021-01-23 ENCOUNTER — Ambulatory Visit (HOSPITAL_COMMUNITY): Payer: Medicare HMO | Attending: Cardiology

## 2021-01-23 ENCOUNTER — Encounter (HOSPITAL_COMMUNITY): Payer: Self-pay | Admitting: Cardiovascular Disease

## 2021-01-30 ENCOUNTER — Other Ambulatory Visit: Payer: Self-pay

## 2021-01-30 ENCOUNTER — Ambulatory Visit (HOSPITAL_COMMUNITY): Payer: Medicare HMO | Attending: Cardiovascular Disease

## 2021-01-30 DIAGNOSIS — I350 Nonrheumatic aortic (valve) stenosis: Secondary | ICD-10-CM | POA: Insufficient documentation

## 2021-01-30 LAB — ECHOCARDIOGRAM COMPLETE
AR max vel: 1.23 cm2
AV Area VTI: 1.5 cm2
AV Area mean vel: 1.36 cm2
AV Mean grad: 20.5 mmHg
AV Peak grad: 40.8 mmHg
Ao pk vel: 3.19 m/s
S' Lateral: 2.2 cm

## 2021-01-30 MED ORDER — PERFLUTREN LIPID MICROSPHERE
1.0000 mL | INTRAVENOUS | Status: AC | PRN
  Administered 2021-01-30: 3 mL via INTRAVENOUS

## 2021-01-31 ENCOUNTER — Other Ambulatory Visit: Payer: Self-pay

## 2021-01-31 DIAGNOSIS — I5033 Acute on chronic diastolic (congestive) heart failure: Secondary | ICD-10-CM

## 2021-01-31 DIAGNOSIS — I10 Essential (primary) hypertension: Secondary | ICD-10-CM

## 2021-02-15 ENCOUNTER — Other Ambulatory Visit (HOSPITAL_COMMUNITY): Payer: Self-pay | Admitting: Internal Medicine

## 2021-03-19 ENCOUNTER — Other Ambulatory Visit (HOSPITAL_COMMUNITY): Payer: Self-pay | Admitting: Internal Medicine

## 2021-03-24 ENCOUNTER — Telehealth: Payer: Self-pay | Admitting: Cardiovascular Disease

## 2021-03-24 NOTE — Telephone Encounter (Signed)
Spoke to patient he stated he has been sob for the past 4 to 5 days.Stated he went to Urgent Care yesterday.CXR was done.He does not have pneumonia and no flu.He was told to see Dr.Berry as soon as possible.Advised Dr.Berry's schedule is full.Appointment scheduled with DOD Dr.Crenshaw 03/26/21 at 3:30 pm.

## 2021-03-24 NOTE — Telephone Encounter (Signed)
Pt had a CXR yesterday and was advised to see his Cardiologist ASAP, pt refused any appt I gave him. He wanted me to send a message to the nurse to see if he can be seen soon

## 2021-03-25 NOTE — Progress Notes (Signed)
HPI: Follow-up aortic stenosis.  Also with chronic diastolic congestive heart failure, hypertension, hyperlipidemia, diabetes mellitus and obstructive sleep apnea.  Followed by Dr. Gwenlyn Found.  Nuclear study December 2016 showed ejection fraction 47%, mild ischemia in the basal inferior lateral and mid inferolateral wall. Carotid Dopplers February 2022 showed 1 to 39% bilateral stenosis.  Most recent echocardiogram September 2022 showed vigorous LV function, moderate left ventricular hypertrophy, severe biatrial enlargement, moderate aortic stenosis with mean gradient 20.5 mmHg, trace aortic insufficiency.  However also note patient had an echocardiogram September 2021 demonstrating severe aortic stenosis with peak velocity 4.3 m/s and mean gradient 42 mmHg partially explained by hyperdynamic LV function.  Seen recently at outside facility for dyspnea.  Chest x-ray unrevealing.  Because of his dyspnea he was added to my schedule today.  Patient apparently has had some dyspnea on exertion over the past 5 months.  No orthopnea or PND.  Chronic mild pedal edema.  He does not have chest pain or syncope.  Over the weekend he developed "congestion" with a cough productive of whitish sputum.  He denies fevers or chills and no hemoptysis.  He received an antibiotic and states his cough and dyspnea is improving.  Cardiology now asked to evaluate.  Note he denies palpitations.  Current Outpatient Medications  Medication Sig Dispense Refill   acetaminophen (TYLENOL) 500 MG tablet Take 1,000 mg by mouth in the morning and at bedtime.     atorvastatin (LIPITOR) 40 MG tablet Take 40 mg by mouth every evening.  2   azithromycin (ZITHROMAX) 250 MG tablet Take 250 mg by mouth.     carvedilol (COREG) 12.5 MG tablet Take 12.5 mg by mouth 2 (two) times daily with a meal.     cetirizine (ZYRTEC) 10 MG tablet Take 10 mg by mouth daily.     diclofenac (VOLTAREN) 75 MG EC tablet Take by mouth as needed.     folic acid  (FOLVITE) 1 MG tablet Take 1 mg by mouth daily.     hydrALAZINE (APRESOLINE) 100 MG tablet TAKE 1 TABLET BY MOUTH TWICE A DAY 180 tablet 3   hydrOXYzine (ATARAX/VISTARIL) 25 MG tablet Take by mouth.     LEVEMIR FLEXTOUCH 100 UNIT/ML Pen Inject 20 Units into the skin at bedtime.  5   lisinopril (PRINIVIL,ZESTRIL) 40 MG tablet Take 40 mg by mouth daily.  2   Multiple Vitamin (MULTIVITAMIN WITH MINERALS) TABS tablet Take 1 tablet by mouth daily.     PROAIR HFA 108 (90 Base) MCG/ACT inhaler Inhale 1-2 puffs into the lungs every 6 (six) hours as needed for wheezing or shortness of breath.     spironolactone (ALDACTONE) 25 MG tablet TAKE 1 TABLET (25 MG TOTAL) BY MOUTH DAILY. PLEASE CALL FOR AN OFFICE VISIT [phone] 30 tablet 0   tamsulosin (FLOMAX) 0.4 MG CAPS capsule Take 0.4 mg by mouth daily.     TIADYLT ER 360 MG 24 hr capsule Take 360 mg by mouth daily.      tolterodine (DETROL LA) 4 MG 24 hr capsule Take 4 mg by mouth daily.     triamcinolone cream (KENALOG) 0.1 % Apply 1 application topically daily as needed (irritation).   1   metFORMIN (GLUCOPHAGE) 1000 MG tablet Take 1,000 mg by mouth 2 (two) times daily with a meal. (Patient not taking: Reported on 03/26/2021)     potassium chloride SA (KLOR-CON) 20 MEQ tablet Take 20 mEq by mouth 2 (two) times daily as needed (Takes  with torsemide). (Patient not taking: Reported on 03/26/2021)     torsemide (DEMADEX) 20 MG tablet Take 2 tablets (40 mg total) by mouth 2 (two) times daily. (Patient not taking: Reported on 03/26/2021) 120 tablet 6   No current facility-administered medications for this visit.     Past Medical History:  Diagnosis Date   Arthritis    CHF (congestive heart failure) (HCC)    Chronic diastolic heart failure ([place])    a. 10/2016 Echo: EF 55-60%, gr2 DD, triv AI, mod dil LA, mildly dil RV/RA.   Chronic low back pain with sciatica    Constipation    DM (diabetes mellitus) type II controlled peripheral vascular disorder     GERD (gastroesophageal reflux disease)    History of stress test    a. 04/2015 MV: EF 47%, mild basal inflat and mid inflat defect c/w ischemia. EF nl by echo @ that time and no further isch w/u pursued.   Hyperlipidemia    Hypertension    Hypokalemia    Microcytic anemia    Moderate aortic stenosis    a. 10/2016 Echo: Mod AS.   Numbness and tingling    feet bilat    Peripheral arterial disease (HCC)    Primary osteoarthritis of left knee    Sleep apnea    Urinary incontinence    Venous insufficiency    a. chronic LEE-->wound mgmt/wraps   Venous stasis ulcer ([place])     Past Surgical History:  Procedure Laterality Date   BIOPSY  09/28/2019   Procedure: BIOPSY;  Surgeon: Milus Banister, MD;  Location: [facility] ENDOSCOPY;  Service: Endoscopy;;   COLON RESECTION  2007   COLON SURGERY     COLONOSCOPY WITH PROPOFOL N/A 09/28/2019   Procedure: COLONOSCOPY WITH PROPOFOL;  Surgeon: Milus Banister, MD;  Location: [facility] ENDOSCOPY;  Service: Endoscopy;  Laterality: N/A;   ESOPHAGOGASTRODUODENOSCOPY (EGD) WITH PROPOFOL N/A 09/28/2019   Procedure: ESOPHAGOGASTRODUODENOSCOPY (EGD) WITH PROPOFOL;  Surgeon: Milus Banister, MD;  Location: [facility] ENDOSCOPY;  Service: Endoscopy;  Laterality: N/A;   POLYPECTOMY  09/28/2019   Procedure: POLYPECTOMY;  Surgeon: Milus Banister, MD;  Location: [facility] ENDOS[facility];  Service: Endoscopy;;   TONSILLECTOMY     TOTAL KNEE ARTHROPLASTY Left 05/31/2015   Procedure: TOTAL LEFT  KNEE ARTHROPLASTY;  Surgeon: Gaynelle Arabian, MD;  Location: [facility] ORS;  Service: Orthopedics;  Laterality: Left;   TRANSTHORACIC ECHOCARDIOGRAM  07/02/2005   mod concentric LVH; LV borderline dilated; LA mild-mod dilated; mild MR & TR   VARICOSE VEIN SURGERY     R leg    Social History   Socioeconomic History   Marital status: Married    Spouse name: Not on file   Number of children: 2   Years of education: post grad   Highest education level: Not on file  Occupational History   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  Tobacco Use   Smoking status: Former    Packs/day: 1.00    Years: 35.00    Pack years: 35.00    Types: Cigarettes    Quit date: 07/25/2005    Years since quitting: 15.6   Smokeless tobacco: Never  Vaping Use   Vaping Use: Never used  Substance and Sexual Activity   Alcohol use: Yes    Alcohol/week: 0.5 standard drinks    Types: 1 Standard drinks or equivalent per week    Comment: rarely    Drug use: No   Sexual activity: Not  on file  Other Topics Concern   Not on file  Social History Narrative   Not on file   Social Determinants of Health   Financial Resource Strain: Not on file  Food Insecurity: Not on file  Transportation Needs: Not on file  Physical Activity: Not on file  Stress: Not on file  Social Connections: Not on file  Intimate Partner Violence: Not on file    Family History  Problem Relation Age of Onset   Pancreatic cancer Father    Heart Problems Mother    Hypertension Mother    Diabetes Brother    Hypertension Brother    Colon cancer Neg Hx     ROS: no fevers or chills, productive cough, hemoptysis, dysphasia, odynophagia, melena, hematochezia, dysuria, hematuria, rash, seizure activity, orthopnea, PND, claudication. Remaining systems are negative.  Physical Exam: Well-developed obese in no acute distress.  Skin is warm and dry.  HEENT is normal.  Neck is supple.  Chest is clear to auscultation with normal expansion.  Cardiovascular exam is irregular, 2/6 systolic murmur left sternal border. Abdominal exam nontender or distended. No masses palpated. Extremities show no edema. neuro grossly intact  ECG-atrial fibrillation at a rate of 98, normal axis, cannot rule out septal infarct, nonspecific ST changes.  Personally reviewed  A/P  1 new onset atrial fibrillation-patient has developed new onset atrial fibrillation.  This is likely contributing to his dyspnea.  His heart rate is  controlled.  We will continue carvedilol.  Check TSH.  Add apixaban 5 mg twice daily.  I will have him seen back in 4 weeks.  If he remains in atrial fibrillation we will likely need to schedule cardioversion as he would likely feel better long-term in sinus rhythm.  2 dyspnea-likely multifactorial including recent upper respiratory infection, chronic diastolic congestive heart failure, aortic stenosis, obesity hypoventilation syndrome and sleep apnea.  I will check BNP.  May need to increase diuretics.  3 aortic stenosis-moderate on most recent echocardiogram though severe on previous.  Will need close follow-up and may need aortic valve replacement in the near future.  4 hypertension-blood pressure controlled.  Continue present medications and follow.  5 chronic diastolic congestive heart failure-he does not appear to be markedly volume overloaded on examination though his exam is difficult with obesity.  Will check BNP.  Continue diuretics.  6 hyperlipidemia-continue statin.   7 history of venous stasis ulcers of both lower extremities-continue Lasix.  8 sleep apnea-Per pulmonary.  Kirk Ruths, MD

## 2021-03-26 ENCOUNTER — Other Ambulatory Visit: Payer: Self-pay

## 2021-03-26 ENCOUNTER — Ambulatory Visit (INDEPENDENT_AMBULATORY_CARE_PROVIDER_SITE_OTHER): Payer: Medicare HMO | Admitting: Cardiology

## 2021-03-26 ENCOUNTER — Encounter: Payer: Self-pay | Admitting: Cardiology

## 2021-03-26 VITALS — BP 100/58 | HR 98 | Ht 72.0 in | Wt 301.2 lb

## 2021-03-26 DIAGNOSIS — I350 Nonrheumatic aortic (valve) stenosis: Secondary | ICD-10-CM | POA: Diagnosis not present

## 2021-03-26 DIAGNOSIS — R0602 Shortness of breath: Secondary | ICD-10-CM | POA: Diagnosis not present

## 2021-03-26 DIAGNOSIS — I4819 Other persistent atrial fibrillation: Secondary | ICD-10-CM

## 2021-03-26 DIAGNOSIS — I10 Essential (primary) hypertension: Secondary | ICD-10-CM | POA: Diagnosis not present

## 2021-03-26 MED ORDER — APIXABAN 5 MG PO TABS: 5.0000 mg | ORAL_TABLET | Freq: Two times a day (BID) | ORAL | 6 refills | Status: DC

## 2021-03-26 NOTE — Addendum Note (Signed)
Addended by: Orma Render on: 03/26/2021 04:48 PM   Modules accepted: Orders

## 2021-03-26 NOTE — Patient Instructions (Signed)
Medication Instructions:   START ELIQUIS 5 MG ONE TABLET TWICE DAILY  *If you need a refill on your cardiac medications before your next appointment, please call your pharmacy*  Follow-Up: At [facility], you and your health needs are our priority.  As part of our continuing mission to provide you with exceptional heart care, we have created designated Provider Care Teams.  These Care Teams include your primary Cardiologist (physician) and Advanced Practice Providers (APPs -  Physician Assistants and Nurse Practitioners) who all work together to provide you with the care you need, when you need it.  We recommend signing up for the patient portal called "MyChart".  Sign up information is provided on this After Visit Summary.  MyChart is used to connect with patients for Virtual Visits (Telemedicine).  Patients are able to view lab/test results, encounter notes, upcoming appointments, etc.  Non-urgent messages can be sent to your provider as well.   To learn more about what you can do with MyChart, go to [email or web address].    Your next appointment:   4 week(s)  The format for your next appointment:   In Person  Provider:  APP OR DR Gwenlyn Found

## 2021-03-27 LAB — CBC
Hematocrit: 35.5 % — ABNORMAL LOW (ref 37.5–51.0)
Hemoglobin: 11.5 g/dL — ABNORMAL LOW (ref 13.0–17.7)
MCH: 31.9 pg (ref 26.6–33.0)
MCHC: 32.4 g/dL (ref 31.5–35.7)
MCV: 98 fL — ABNORMAL HIGH (ref 79–97)
Platelets: 311 10*3/uL (ref 150–450)
RBC: 3.61 x10E6/uL — ABNORMAL LOW (ref 4.14–5.80)
RDW: 12.9 % (ref 11.6–15.4)
WBC: 8.4 10*3/uL (ref 3.4–10.8)

## 2021-03-27 LAB — BASIC METABOLIC PANEL
BUN/Creatinine Ratio: 18 (ref 10–24)
BUN: 34 mg/dL — ABNORMAL HIGH (ref 8–27)
CO2: 22 mmol/L (ref 20–29)
Calcium: 9 mg/dL (ref 8.6–10.2)
Chloride: 102 mmol/L (ref 96–106)
Creatinine, Ser: 1.94 mg/dL — ABNORMAL HIGH (ref 0.76–1.27)
Glucose: 245 mg/dL — ABNORMAL HIGH (ref 70–99)
Potassium: 4.6 mmol/L (ref 3.5–5.2)
Sodium: 136 mmol/L (ref 134–144)
eGFR: 36 mL/min/{1.73_m2} — ABNORMAL LOW (ref >59)

## 2021-03-27 LAB — TSH: TSH: 1.06 u[IU]/mL (ref 0.450–4.500)

## 2021-03-27 LAB — PRO B NATRIURETIC PEPTIDE: NT-Pro BNP: 2647 pg/mL — ABNORMAL HIGH (ref 0–376)

## 2021-04-01 ENCOUNTER — Telehealth: Payer: Self-pay | Admitting: Cardiology

## 2021-04-01 ENCOUNTER — Ambulatory Visit: Payer: Medicare HMO | Admitting: Cardiovascular Disease

## 2021-04-01 DIAGNOSIS — R0602 Shortness of breath: Secondary | ICD-10-CM

## 2021-04-01 DIAGNOSIS — Z79899 Other long term (current) drug therapy: Secondary | ICD-10-CM

## 2021-04-01 DIAGNOSIS — I4819 Other persistent atrial fibrillation: Secondary | ICD-10-CM

## 2021-04-01 MED ORDER — TORSEMIDE 20 MG PO TABS: 20.0000 mg | ORAL_TABLET | Freq: Every day | ORAL | Status: AC

## 2021-04-01 NOTE — Telephone Encounter (Signed)
Patient returning call for lab results. 

## 2021-04-01 NOTE — Telephone Encounter (Signed)
Ask pt to take demadex 20 mg daily; bmet one week with results to Dr Gwenlyn Found; would  have pt fu with Dr Gwenlyn Found 1-2 weeks   Kirk Ruths , MD  03/27/2021  7:16 AM EST  Pt informed of providers result & recommendations. Pt verbalized understanding.informed pt that someone will call to schedule JB follow up appt in 1-2 weeks.

## 2021-04-12 ENCOUNTER — Other Ambulatory Visit (HOSPITAL_COMMUNITY): Payer: Self-pay | Admitting: Internal Medicine

## 2021-04-23 ENCOUNTER — Other Ambulatory Visit: Payer: Self-pay

## 2021-04-23 ENCOUNTER — Ambulatory Visit (INDEPENDENT_AMBULATORY_CARE_PROVIDER_SITE_OTHER): Payer: Medicare HMO | Admitting: Cardiovascular Disease

## 2021-04-23 ENCOUNTER — Encounter: Payer: Self-pay | Admitting: Cardiovascular Disease

## 2021-04-23 DIAGNOSIS — I10 Essential (primary) hypertension: Secondary | ICD-10-CM | POA: Diagnosis not present

## 2021-04-23 DIAGNOSIS — E782 Mixed hyperlipidemia: Secondary | ICD-10-CM

## 2021-04-23 DIAGNOSIS — I350 Nonrheumatic aortic (valve) stenosis: Secondary | ICD-10-CM

## 2021-04-23 DIAGNOSIS — I480 Paroxysmal atrial fibrillation: Secondary | ICD-10-CM

## 2021-04-23 DIAGNOSIS — I5033 Acute on chronic diastolic (congestive) heart failure: Secondary | ICD-10-CM

## 2021-04-23 DIAGNOSIS — E785 Hyperlipidemia, unspecified: Secondary | ICD-10-CM | POA: Insufficient documentation

## 2021-04-23 DIAGNOSIS — G4733 Obstructive sleep apnea (adult) (pediatric): Secondary | ICD-10-CM | POA: Diagnosis not present

## 2021-04-23 NOTE — Patient Instructions (Signed)
Medication Instructions:  Your physician recommends that you continue on your current medications as directed. Please refer to the Current Medication list given to you today.  *If you need a refill on your cardiac medications before your next appointment, please call your pharmacy*   Lab Work: Your physician recommends that you return for lab work in: within 7 days of Cardioversion   If you have labs (blood work) drawn today and your tests are completely normal, you will receive your results only by: [place] (if you have MyChart) OR A paper copy in the mail If you have any lab test that is abnormal or we need to change your treatment, we will call you to review the results.   Testing/Procedures: See below.   Follow-Up: At [facility], you and your health needs are our priority.  As part of our continuing mission to provide you with exceptional heart care, we have created designated Provider Care Teams.  These Care Teams include your primary Cardiologist (physician) and Advanced Practice Providers (APPs -  Physician Assistants and Nurse Practitioners) who all work together to provide you with the care you need, when you need it.  We recommend signing up for the patient portal called "MyChart".  Sign up information is provided on this After Visit Summary.  MyChart is used to connect with patients for Virtual Visits (Telemedicine).  Patients are able to view lab/test results, encounter notes, upcoming appointments, etc.  Non-urgent messages can be sent to your provider as well.   To learn more about what you can do with MyChart, go to [email or web address].    Your next appointment:   6-8 week(s)  The format for your next appointment:   In Person  Provider:   Quay Burow, MD  Other Instructions   You are scheduled for a Cardioversion on Wednesday, December 21st with Dr. Gasper Sells.  Please arrive at the [facility] (Main Entrance A) at [facility]: [address] [place], [place] [address] at 9:30am. 1 hour prior to procedure unless lab work is needed; if lab work is needed arrive 1.5 hours ahead)  DIET: Nothing to eat or drink after midnight except a sip of water with medications (see medication instructions below)  FYI: For your safety, and to allow [place] to monitor your vital signs accurately during the surgery/procedure we request that   if you have artificial nails, gel coating, SNS etc. Please have those removed prior to your surgery/procedure. Not having the nail coverings /polish removed may result in cancellation or delay of your surgery/procedure.   Medication Instructions: Hold Toresemide  -Only take 1/2 amount of levemir (10 units)  Continue your anticoagulant: Eliquis You will need to continue your anticoagulant after your procedure until you  are told by your  Provider that it is safe to stop   Labs: to be done within 7 days of procedure.   You must have a responsible person to drive you home and stay in the waiting area during your procedure. Failure to do so could result in cancellation.  Bring your insurance cards.  *Special Note: Every effort is made to have your procedure done on time. Occasionally there are emergencies that occur at the hospital that may cause delays. Please be patient if a delay does occur.

## 2021-04-23 NOTE — Assessment & Plan Note (Signed)
History of diastolic heart failure on Aldactone and furosemide.

## 2021-04-23 NOTE — Assessment & Plan Note (Signed)
History of obstructive sleep apnea on CPAP. 

## 2021-04-23 NOTE — Assessment & Plan Note (Signed)
History of persistent A. fib rate controlled on Eliquis oral anticoagulation.  I suspect this is contributing to his dyspnea.  He has been on oral anticoagulation for 1 month.  We will arrange for him to undergo outpatient DC cardioversion.

## 2021-04-23 NOTE — Progress Notes (Signed)
04/23/2021 MICKEL SCHREUR   [birth year]  [id number]  Primary Physician Nolene Ebbs, MD Primary Cardiologist: Lorretta Harp MD FACP, [place], [place], [place]  HPI:  Mario Mills is a 70 y.o.  severely overweight, married [place] American male with a history of hypertension, hyperlipidemia, noninsulin-requiring diabetes and obstructive sleep apnea on CPAP. I last saw him in the office 06/14/2020.[name]  He is accompanied by his wife Mario Mills today.  He stopped smoking 9 years ago. I saw him last March 04, 2007. He successfully underwent colon resection after a normal 2D echo and Cardiolite stress test at that time. He is otherwise asymptomatic except for spinal stenosis.  He's been stable since except for a poorly healing left pretibial venous stasis ulcer thought that Dr. Judene Companion at the [facility] wound care clinic. Arterial and venous Dopplers were obtained our office on 12/16/12 revealing a right ankle regular neck supple 95 and a left of 0.73 with a high-frequency signal in his left SFA though it is hard to elicit a clear history of claudication. Venous Dopplers to suggest venous reflux on the left. 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 He is scheduled for left total knee replacement in January.  He denies chest pain but has chronic dyspnea on exertion. He had a right total knee replacement by Dr. Reynaldo Minium in January. Prior to that he had a 2-D echo which was normal and Myoview stress test that showed very mild lateral ischemia which I thought was relatively low risk. He had a somewhat protracted postoperative course with hypertensive urgency and volume overload. He was kept in place after that. He has now ambulatory and relatively asymptomatic.   He was admitted to [facility] 04/11/17 with volume overload and diastolic heart failure, and again in 2019.  He does see Dr. Haroldine Laws in the advanced heart failure clinic.[name] He was diuresed. His diuretics were adjusted. Does admit to  dietary indiscretion with regards to salt. 2-D echo performed 11/13/16 revealed normal LV systolic function with moderate LVH and moderate aortic stenosis.   Since I saw him in the office a year ago he has seen Dr. Stanford Breed months ago for increasing shortness of breath.  He is found to be in A. fib.  He was begun on oral anticoagulation.  He was also ready on a beta-blocker for rate control.  2D echocardiogram revealed vigorous LV function with moderate aortic stenosis.  He does have chronic diastolic heart failure on oral diuretics.   Current Meds  Medication Sig   acetaminophen (TYLENOL) 500 MG tablet Take 1,000 mg by mouth in the morning and at bedtime.   apixaban (ELIQUIS) 5 MG TABS tablet Take 1 tablet (5 mg total) by mouth 2 (two) times daily.   atorvastatin (LIPITOR) 40 MG tablet Take 40 mg by mouth every evening.   carvedilol (COREG) 12.5 MG tablet Take 12.5 mg by mouth 2 (two) times daily with a meal.   cetirizine (ZYRTEC) 10 MG tablet Take 10 mg by mouth daily.   diclofenac Sodium (VOLTAREN) 1 % GEL Apply topically 4 (four) times daily.   folic acid (FOLVITE) 1 MG tablet Take 1 mg by mouth daily.   hydrALAZINE (APRESOLINE) 100 MG tablet TAKE 1 TABLET BY MOUTH TWICE A DAY   hydrOXYzine (ATARAX/VISTARIL) 25 MG tablet Take by mouth.   LEVEMIR FLEXTOUCH 100 UNIT/ML Pen Inject 20 Units into the skin at bedtime.   lisinopril (PRINIVIL,ZESTRIL) 40 MG tablet Take 40 mg by mouth  daily.   Multiple Vitamin (MULTIVITAMIN WITH MINERALS) TABS tablet Take 1 tablet by mouth daily.   PROAIR HFA 108 (90 Base) MCG/ACT inhaler Inhale 1-2 puffs into the lungs every 6 (six) hours as needed for wheezing or shortness of breath.   spironolactone (ALDACTONE) 25 MG tablet Take 1 tablet (25 mg total) by mouth daily.   tamsulosin (FLOMAX) 0.4 MG CAPS capsule Take 0.4 mg by mouth daily.   TIADYLT ER 360 MG 24 hr capsule Take 360 mg by mouth daily.    tolterodine (DETROL LA) 4 MG 24 hr capsule Take 4 mg by mouth  daily.   torsemide (DEMADEX) 20 MG tablet Take 1 tablet (20 mg total) by mouth daily.   triamcinolone cream (KENALOG) 0.1 % Apply 1 application topically daily as needed (irritation).    [DISCONTINUED] azithromycin (ZITHROMAX) 250 MG tablet Take 250 mg by mouth.   [DISCONTINUED] diclofenac (VOLTAREN) 75 MG EC tablet Take by mouth as needed.   [DISCONTINUED] metFORMIN (GLUCOPHAGE) 1000 MG tablet Take 1,000 mg by mouth 2 (two) times daily with a meal.   [DISCONTINUED] potassium chloride SA (KLOR-CON) 20 MEQ tablet Take 20 mEq by mouth 2 (two) times daily as needed (Takes with torsemide).     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  Tobacco Use   Smoking status: Former    Packs/day: 1.00    Years: 35.00    Pack years: 35.00    Types: Cigarettes    Quit date: 07/25/2005    Years since quitting: 15.7   Smokeless tobacco: Never  Vaping Use   Vaping Use: Never used  Substance and Sexual Activity   Alcohol use: Yes    Alcohol/week: 0.5 standard drinks    Types: 1 Standard drinks or equivalent per week    Comment: rarely    Drug use: No   Sexual activity: Not on file  Other Topics Concern   Not on file  Social History Narrative   Not on file   Social Determinants of Health   Financial Resource Strain: Not on file  Food Insecurity: Not on file  Transportation Needs: Not on file  Physical Activity: Not on file  Stress: Not on file  Social Connections: Not on file  Intimate Partner Violence: Not on file     Review of Systems: General: negative for chills, fever, night sweats or weight changes.  Cardiovascular: negative for chest pain, dyspnea on exertion, edema, orthopnea, palpitations, paroxysmal nocturnal  dyspnea or shortness of breath Dermatological: negative for rash Respiratory: negative for cough or wheezing Urologic: negative for hematuria Abdominal: negative for nausea, vomiting, diarrhea, bright red blood per rectum, melena, or hematemesis Neurologic: negative for visual changes, syncope, or dizziness All other systems reviewed and are otherwise negative except as noted above.    Blood pressure 92/64, pulse 96, height 6' (1.829 m), weight (!) 304 lb (137.9 kg), SpO2 99 %.  General appearance: alert and no distress Neck: no adenopathy, no carotid bruit, no JVD, supple, symmetrical, trachea midline, and thyroid not enlarged, symmetric, no tenderness/mass/nodules Lungs: clear to auscultation bilaterally Heart: irregularly irregular rhythm Extremities: Chronic lower extremity edema Pulses: 2+ and symmetric Skin: Skin color, texture, turgor normal. No  rashes or lesions Neurologic: Grossly normal  EKG atrial fibrillation with a ventricular sponsor of 96 and septal Q waves.  I personally reviewed this EKG.  ASSESSMENT AND PLAN:   Essential hypertension History of essential hypertension with blood pressure measured today at 92/64.  He is on carvedilol, lisinopril and Aldactone.  Obstructive sleep apnea History of obstructive sleep apnea on CPAP.  Acute on chronic diastolic (congestive) heart failure (HCC) History of diastolic heart failure on Aldactone and furosemide.  Paroxysmal atrial fibrillation (HCC) History of persistent A. fib rate controlled on Eliquis oral anticoagulation.  I suspect this is contributing to his dyspnea.  He has been on oral anticoagulation for 1 month.  We will arrange for him to undergo outpatient DC cardioversion.  Aortic stenosis History of moderate aortic stenosis with 2D echo performed 01/30/2021 revealing vigorous LV function with an EF in the 70% range with severely dilated atria bilaterally and moderate aortic stenosis.  His peak aortic gradient is  40 mmHg with a valve area of 1.5 cm.  Hyperlipidemia History of hyperlipidemia on statin therapy with lipid profile followed by his PCP.     Lorretta Harp MD FACP,FACC,FAHA, [facility] 04/23/2021 3:03 PM

## 2021-04-23 NOTE — H&P (View-Only) (Signed)
04/23/2021 Mario Mills   [birth year]  [id number]  Primary Physician Mario Ebbs, MD Primary Cardiologist: Mario Harp MD FACP, Mario Mills, [place], Mario Mills  HPI:  Mario Mills is a 70 y.o.  severely overweight, married [place] American male with a history of hypertension, hyperlipidemia, noninsulin-requiring diabetes and obstructive sleep apnea on CPAP. I last saw him in the office 06/14/2020.[name]  He is accompanied by his wife Mario Mills today.  He stopped smoking 9 years ago. I saw him last March 04, 2007. He successfully underwent colon resection after a normal 2D echo and Cardiolite stress test at that time. He is otherwise asymptomatic except for spinal stenosis.  He's been stable since except for a poorly healing left pretibial venous stasis ulcer thought that Dr. Judene Mills at the [facility] wound care clinic. Arterial and venous Dopplers were obtained our office on 12/16/12 revealing a right ankle regular neck supple 95 and a left of 0.73 with a high-frequency signal in his left SFA though it is hard to elicit a clear history of claudication. Venous Dopplers to suggest venous reflux on the left. 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 He is scheduled for left total knee replacement in January.  He denies chest pain but has chronic dyspnea on exertion. He had a right total knee replacement by Dr. Reynaldo Mills in January. Prior to that he had a 2-D echo which was normal and Myoview stress test that showed very mild lateral ischemia which I thought was relatively low risk. He had a somewhat protracted postoperative course with hypertensive urgency and volume overload. He was kept in place after that. He has now ambulatory and relatively asymptomatic.   He was admitted to [facility] 04/11/17 with volume overload and diastolic heart failure, and again in 2019.  He does see Dr. Haroldine Mills in the advanced heart failure clinic.[name] He was diuresed. His diuretics were adjusted. Does admit to  dietary indiscretion with regards to salt. 2-D echo performed 11/13/16 revealed normal LV systolic function with moderate LVH and moderate aortic stenosis.   Since I saw him in the office a year ago he has seen Dr. Stanford Mills months ago for increasing shortness of breath.  He is found to be in A. fib.  He was begun on oral anticoagulation.  He was also ready on a beta-blocker for rate control.  2D echocardiogram revealed vigorous LV function with moderate aortic stenosis.  He does have chronic diastolic heart failure on oral diuretics.   Current Meds  Medication Sig   acetaminophen (TYLENOL) 500 MG tablet Take 1,000 mg by mouth in the morning and at bedtime.   apixaban (ELIQUIS) 5 MG TABS tablet Take 1 tablet (5 mg total) by mouth 2 (two) times daily.   atorvastatin (LIPITOR) 40 MG tablet Take 40 mg by mouth every evening.   carvedilol (COREG) 12.5 MG tablet Take 12.5 mg by mouth 2 (two) times daily with a meal.   cetirizine (ZYRTEC) 10 MG tablet Take 10 mg by mouth daily.   diclofenac Sodium (VOLTAREN) 1 % GEL Apply topically 4 (four) times daily.   folic acid (FOLVITE) 1 MG tablet Take 1 mg by mouth daily.   hydrALAZINE (APRESOLINE) 100 MG tablet TAKE 1 TABLET BY MOUTH TWICE A DAY   hydrOXYzine (ATARAX/VISTARIL) 25 MG tablet Take by mouth.   LEVEMIR FLEXTOUCH 100 UNIT/ML Pen Inject 20 Units into the skin at bedtime.   lisinopril (PRINIVIL,ZESTRIL) 40 MG tablet Take 40 mg by mouth  daily.   Multiple Vitamin (MULTIVITAMIN WITH MINERALS) TABS tablet Take 1 tablet by mouth daily.   PROAIR HFA 108 (90 Base) MCG/ACT inhaler Inhale 1-2 puffs into the lungs every 6 (six) hours as needed for wheezing or shortness of breath.   spironolactone (ALDACTONE) 25 MG tablet Take 1 tablet (25 mg total) by mouth daily.   tamsulosin (FLOMAX) 0.4 MG CAPS capsule Take 0.4 mg by mouth daily.   TIADYLT ER 360 MG 24 hr capsule Take 360 mg by mouth daily.    tolterodine (DETROL LA) 4 MG 24 hr capsule Take 4 mg by mouth  daily.   torsemide (DEMADEX) 20 MG tablet Take 1 tablet (20 mg total) by mouth daily.   triamcinolone cream (KENALOG) 0.1 % Apply 1 application topically daily as needed (irritation).    [DISCONTINUED] azithromycin (ZITHROMAX) 250 MG tablet Take 250 mg by mouth.   [DISCONTINUED] diclofenac (VOLTAREN) 75 MG EC tablet Take by mouth as needed.   [DISCONTINUED] metFORMIN (GLUCOPHAGE) 1000 MG tablet Take 1,000 mg by mouth 2 (two) times daily with a meal.   [DISCONTINUED] potassium chloride SA (KLOR-CON) 20 MEQ tablet Take 20 mEq by mouth 2 (two) times daily as needed (Takes with torsemide).     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  Tobacco Use   Smoking status: Former    Packs/day: 1.00    Years: 35.00    Pack years: 35.00    Types: Cigarettes    Quit date: 07/25/2005    Years since quitting: 15.7   Smokeless tobacco: Never  Vaping Use   Vaping Use: Never used  Substance and Sexual Activity   Alcohol use: Yes    Alcohol/week: 0.5 standard drinks    Types: 1 Standard drinks or equivalent per week    Comment: rarely    Drug use: No   Sexual activity: Not on file  Other Topics Concern   Not on file  Social History Narrative   Not on file   Social Determinants of Health   Financial Resource Strain: Not on file  Food Insecurity: Not on file  Transportation Needs: Not on file  Physical Activity: Not on file  Stress: Not on file  Social Connections: Not on file  Intimate Partner Violence: Not on file     Review of Systems: General: negative for chills, fever, night sweats or weight changes.  Cardiovascular: negative for chest pain, dyspnea on exertion, edema, orthopnea, palpitations, paroxysmal nocturnal  dyspnea or shortness of breath Dermatological: negative for rash Respiratory: negative for cough or wheezing Urologic: negative for hematuria Abdominal: negative for nausea, vomiting, diarrhea, bright red blood per rectum, melena, or hematemesis Neurologic: negative for visual changes, syncope, or dizziness All other systems reviewed and are otherwise negative except as noted above.    Blood pressure 92/64, pulse 96, height 6' (1.829 m), weight (!) 304 lb (137.9 kg), SpO2 99 %.  General appearance: alert and no distress Neck: no adenopathy, no carotid bruit, no JVD, supple, symmetrical, trachea midline, and thyroid not enlarged, symmetric, no tenderness/mass/nodules Lungs: clear to auscultation bilaterally Heart: irregularly irregular rhythm Extremities: Chronic lower extremity edema Pulses: 2+ and symmetric Skin: Skin color, texture, turgor normal. No  rashes or lesions Neurologic: Grossly normal  EKG atrial fibrillation with a ventricular sponsor of 96 and septal Q waves.  I personally reviewed this EKG.  ASSESSMENT AND PLAN:   Essential hypertension History of essential hypertension with blood pressure measured today at 92/64.  He is on carvedilol, lisinopril and Aldactone.  Obstructive sleep apnea History of obstructive sleep apnea on CPAP.  Acute on chronic diastolic (congestive) heart failure (HCC) History of diastolic heart failure on Aldactone and furosemide.  Paroxysmal atrial fibrillation (HCC) History of persistent A. fib rate controlled on Eliquis oral anticoagulation.  I suspect this is contributing to his dyspnea.  He has been on oral anticoagulation for 1 month.  We will arrange for him to undergo outpatient DC cardioversion.  Aortic stenosis History of moderate aortic stenosis with 2D echo performed 01/30/2021 revealing vigorous LV function with an EF in the 70% range with severely dilated atria bilaterally and moderate aortic stenosis.  His peak aortic gradient is  40 mmHg with a valve area of 1.5 cm.  Hyperlipidemia History of hyperlipidemia on statin therapy with lipid profile followed by his PCP.     Mario Harp MD FACP,FACC,FAHA, [facility] 04/23/2021 3:03 PM

## 2021-04-23 NOTE — Assessment & Plan Note (Signed)
History of moderate aortic stenosis with 2D echo performed 01/30/2021 revealing vigorous LV function with an EF in the 70% range with severely dilated atria bilaterally and moderate aortic stenosis.  His peak aortic gradient is 40 mmHg with a valve area of 1.5 cm.

## 2021-04-23 NOTE — Assessment & Plan Note (Addendum)
History of essential hypertension with blood pressure measured today at 92/64.  He is on carvedilol, lisinopril and Aldactone.

## 2021-04-23 NOTE — Assessment & Plan Note (Signed)
History of hyperlipidemia on statin therapy with lipid profile followed by his PCP.

## 2021-04-24 ENCOUNTER — Other Ambulatory Visit: Payer: Self-pay

## 2021-04-28 ENCOUNTER — Encounter (HOSPITAL_COMMUNITY): Payer: Self-pay | Admitting: Internal Medicine

## 2021-05-01 LAB — BASIC METABOLIC PANEL
BUN/Creatinine Ratio: 18 (ref 10–24)
BUN: 32 mg/dL — ABNORMAL HIGH (ref 8–27)
CO2: 20 mmol/L (ref 20–29)
Calcium: 9.1 mg/dL (ref 8.6–10.2)
Chloride: 101 mmol/L (ref 96–106)
Creatinine, Ser: 1.75 mg/dL — ABNORMAL HIGH (ref 0.76–1.27)
Glucose: 157 mg/dL — ABNORMAL HIGH (ref 70–99)
Potassium: 5.1 mmol/L (ref 3.5–5.2)
Sodium: 136 mmol/L (ref 134–144)
eGFR: 41 mL/min/{1.73_m2} — ABNORMAL LOW (ref >59)

## 2021-05-01 LAB — CBC
Hematocrit: 35.4 % — ABNORMAL LOW (ref 37.5–51.0)
Hemoglobin: 11.5 g/dL — ABNORMAL LOW (ref 13.0–17.7)
MCH: 31.4 pg (ref 26.6–33.0)
MCHC: 32.5 g/dL (ref 31.5–35.7)
MCV: 97 fL (ref 79–97)
Platelets: 286 10*3/uL (ref 150–450)
RBC: 3.66 x10E6/uL — ABNORMAL LOW (ref 4.14–5.80)
RDW: 13 % (ref 11.6–15.4)
WBC: 8.2 10*3/uL (ref 3.4–10.8)

## 2021-05-07 ENCOUNTER — Ambulatory Visit (HOSPITAL_COMMUNITY): Payer: Medicare HMO | Admitting: Anesthesiology

## 2021-05-07 ENCOUNTER — Ambulatory Visit (HOSPITAL_COMMUNITY)
Admission: RE | Admit: 2021-05-07 | Discharge: 2021-05-07 | Disposition: A | Discharge status: Home / Self Care | Payer: Medicare HMO | Source: Ambulatory Visit | Attending: Internal Medicine | Admitting: Internal Medicine

## 2021-05-07 ENCOUNTER — Encounter (HOSPITAL_COMMUNITY)
Admission: RE | Disposition: A | Discharge status: Home / Self Care | Payer: Self-pay | Source: Ambulatory Visit | Attending: Internal Medicine

## 2021-05-07 ENCOUNTER — Encounter (HOSPITAL_COMMUNITY): Payer: Self-pay | Admitting: Internal Medicine

## 2021-05-07 DIAGNOSIS — I4819 Other persistent atrial fibrillation: Secondary | ICD-10-CM | POA: Insufficient documentation

## 2021-05-07 DIAGNOSIS — I4891 Unspecified atrial fibrillation: Secondary | ICD-10-CM

## 2021-05-07 HISTORY — PX: CARDIOVERSION: SHX1299

## 2021-05-07 LAB — GLUCOSE, CAPILLARY: Glucose-Capillary: 104 mg/dL — ABNORMAL HIGH (ref 70–99)

## 2021-05-07 SURGERY — CARDIOVERSION
Anesthesia: General

## 2021-05-07 MED ORDER — PROPOFOL 10 MG/ML IV BOLUS
INTRAVENOUS | Status: DC | PRN
  Administered 2021-05-07: 60 mg via INTRAVENOUS

## 2021-05-07 MED ORDER — LIDOCAINE 2% (20 MG/ML) 5 ML SYRINGE
INTRAMUSCULAR | Status: DC | PRN
  Administered 2021-05-07: 60 mg via INTRAVENOUS

## 2021-05-07 MED ORDER — APIXABAN 5 MG PO TABS
5.0000 mg | ORAL_TABLET | Freq: Once | ORAL | Status: AC
  Administered 2021-05-07: 10:00:00 5 mg via ORAL
  Filled 2021-05-07: qty 1

## 2021-05-07 MED ORDER — SODIUM CHLORIDE 0.9 % IV SOLN: INTRAVENOUS | Status: DC

## 2021-05-07 NOTE — Anesthesia Preprocedure Evaluation (Addendum)
Anesthesia Evaluation  Patient identified by MRN, date of birth, ID band Patient awake    Reviewed: Allergy & Precautions, NPO status , Patient's Chart, lab work & pertinent test results  Airway Mallampati: I       Dental no notable dental hx.    Pulmonary sleep apnea , former smoker,    Pulmonary exam normal        Cardiovascular hypertension, Pt. on medications and Pt. on home beta blockers + Peripheral Vascular Disease  + Valvular Problems/Murmurs AS  Rhythm:Irregular Rate:Normal     Neuro/Psych negative psych ROS   GI/Hepatic   Endo/Other  diabetes, Well Controlled, Type 2, Insulin DependentMorbid obesity  Renal/GU      Musculoskeletal   Abdominal (+) + obese,   Peds  Hematology  (+) anemia ,   Anesthesia Other Findings IMPRESSIONS    1. Left ventricular ejection fraction, by estimation, is 70 to 75%. The  left ventricle has hyperdynamic function. The left ventricle has no  regional wall motion abnormalities. There is moderate concentric left  ventricular hypertrophy. Left ventricular  diastolic function could not be evaluated.  2. Right ventricular systolic function is normal. The right ventricular  size is normal. Tricuspid regurgitation signal is inadequate for assessing  PA pressure.  3. Left atrial size was severely dilated.  4. Right atrial size was severely dilated.  5. The mitral valve is normal in structure. No evidence of mitral valve  regurgitation.  6. The aortic valve has an indeterminant number of cusps. There is severe  calcifcation of the aortic valve. There is severe thickening of the aortic  valve. Aortic valve regurgitation is trivial. Moderate aortic valve  stenosis. Aortic valve mean gradient  measures 20.5 mmHg. Aortic valve Vmax measures 3.19 m/s.  7. The inferior vena cava is dilated in size with >50% respiratory  variability  Reproductive/Obstetrics                              Anesthesia Physical Anesthesia Plan  ASA: 3  Anesthesia Plan: General   Post-op Pain Management:    Induction: Intravenous  PONV Risk Score and Plan: Propofol infusion  Airway Management Planned: Natural Airway and Simple Face Mask  Additional Equipment: None  Intra-op Plan:   Post-operative Plan:   Informed Consent: I have reviewed the patients History and Physical, chart, labs and discussed the procedure including the risks, benefits and alternatives for the proposed anesthesia with the patient or authorized representative who has indicated his/her understanding and acceptance.     Dental advisory given  Plan Discussed with: CRNA  Anesthesia Plan Comments:         Anesthesia Quick Evaluation

## 2021-05-07 NOTE — Transfer of Care (Signed)
Immediate Anesthesia Transfer of Care Note  Patient: Mario Mills  Procedure(s) Performed: CARDIOVERSION  Patient Location: Endoscopy Unit  Anesthesia Type:General  Level of Consciousness: oriented, drowsy and patient cooperative  Airway & Oxygen Therapy: Patient Spontanous Breathing and Patient connected to nasal cannula oxygen  Post-op Assessment: Report given to RN and Post -op Vital signs reviewed and stable  Post vital signs: Reviewed  Last Vitals:  Vitals Value Taken Time  BP 153/66 05/07/21 1059  Temp    Pulse 51 05/07/21 1100  Resp 19 05/07/21 1100  SpO2 100 % 05/07/21 1100  Vitals shown include unvalidated device data.  Last Pain:  Vitals:   05/07/21 0940  TempSrc: Temporal  PainSc: 0-No pain         Complications: No notable events documented.

## 2021-05-07 NOTE — Interval H&P Note (Signed)
History and Physical Interval Note:  05/07/2021 9:58 AM  Mario Mills  has presented today for surgery, with the diagnosis of AFIB.  The various methods of treatment have been discussed with the patient and family. After consideration of risks, benefits and other options for treatment, the patient has consented to  Procedure(s): CARDIOVERSION (N/A) as a surgical intervention.  The patient's history has been reviewed, patient examined, no change in status, stable for surgery.  I have reviewed the patient's chart and labs.  Questions were answered to the patient's satisfaction.     Karren Newland A Jowan Skillin

## 2021-05-07 NOTE — CV Procedure (Signed)
° °  Electrical Cardioversion Procedure Note Mario Mills [id number] 07/13/1950  Procedure: Electrical Cardioversion Indications:  Atrial Fibrillation  Time Out: Verified patient identification, verified procedure,medications/allergies/relevent history reviewed, required imaging and test results available.  Performed  Procedure Details  The patient was NPO after midnight. Anesthesia was administered at the beside  by Dr.Hatchett with 60mg  of lidocaine and 60 mg propofol.  Cardioversion was done with synchronized biphasic defibrillation with AP pads with 200 Joules.  The patient converted to normal sinus rhythm. The patient tolerated the procedure well   IMPRESSION:  Successful cardioversion of atrial fibrillation    Mario Mills Mario Mills 05/07/2021, 10:51 AM

## 2021-05-07 NOTE — Interval H&P Note (Signed)
History and Physical Interval Note:  05/07/2021 9:59 AM  Mario Mills  has presented today for surgery, with the diagnosis of AFIB.  The various methods of treatment have been discussed with the patient and family. After consideration of risks, benefits and other options for treatment, the patient has consented to  Procedure(s): CARDIOVERSION (N/A) as a surgical intervention.  The patient's history has been reviewed, patient examined, no change in status, stable for surgery.  I have reviewed the patient's chart and labs.  Questions were answered to the patient's satisfaction.     Kwynn Schlotter A Douglas Rooks

## 2021-05-07 NOTE — Discharge Instructions (Signed)

## 2021-05-07 NOTE — Anesthesia Postprocedure Evaluation (Signed)
Anesthesia Post Note  Patient: Mario Mills  Procedure(s) Performed: CARDIOVERSION     Patient location during evaluation: Endoscopy Anesthesia Type: General Level of consciousness: awake and sedated Pain management: pain level controlled Vital Signs Assessment: post-procedure vital signs reviewed and stable Respiratory status: spontaneous breathing Cardiovascular status: stable Postop Assessment: no apparent nausea or vomiting Anesthetic complications: no   No notable events documented.  Last Vitals:  Vitals:   05/07/21 1100 05/07/21 1111  BP: (!) 147/68 (!) 150/62  Pulse: (!) 54 (!) 56  Resp:  (!) 21  Temp:    SpO2: 100% 100%    Last Pain:  Vitals:   05/07/21 0940  TempSrc: Temporal  PainSc: 0-No pain                 Huston Foley

## 2021-05-07 NOTE — Anesthesia Procedure Notes (Signed)
Procedure Name: General with mask airway Date/Time: 05/07/2021 10:38 AM Performed by: Jenne Campus, CRNA Pre-anesthesia Checklist: Patient identified, Emergency Drugs available, Suction available and Patient being monitored Oxygen Delivery Method: Ambu bag Induction Type: IV induction

## 2021-05-08 ENCOUNTER — Encounter (HOSPITAL_COMMUNITY): Payer: Self-pay | Admitting: Internal Medicine

## 2021-06-10 ENCOUNTER — Ambulatory Visit (INDEPENDENT_AMBULATORY_CARE_PROVIDER_SITE_OTHER): Payer: Medicare HMO | Admitting: Cardiovascular Disease

## 2021-06-10 ENCOUNTER — Other Ambulatory Visit: Payer: Self-pay

## 2021-06-10 ENCOUNTER — Encounter: Payer: Self-pay | Admitting: Cardiovascular Disease

## 2021-06-10 VITALS — BP 121/58 | HR 60 | Ht 72.0 in | Wt 297.0 lb

## 2021-06-10 DIAGNOSIS — I10 Essential (primary) hypertension: Secondary | ICD-10-CM

## 2021-06-10 DIAGNOSIS — I480 Paroxysmal atrial fibrillation: Secondary | ICD-10-CM | POA: Diagnosis not present

## 2021-06-10 NOTE — Progress Notes (Signed)
06/10/2021 Mario Mills   Jun 20, 1950  [id number]  Primary Physician Nolene Ebbs, MD Primary Cardiologist: Lorretta Harp MD FACP, [place], [place], [place]  HPI:  Mario Mills is a 71 y.o.  severely overweight, married [place] American male father of 2, grandfather of 1 grandchild with a history of hypertension, hyperlipidemia, noninsulin-requiring diabetes and obstructive sleep apnea on CPAP. I last saw him in the office 04/23/2021...  He stopped smoking remotely.   He successfully underwent colon resection after a normal 2D echo and Cardiolite stress test at that time. He is otherwise asymptomatic except for spinal stenosis.  He's been stable since except for a poorly healing left pretibial venous stasis ulcer thought that Dr. Judene Companion at the [facility] wound care clinic. Arterial and venous Dopplers were obtained our office on 12/16/12 revealing a right ankle regular neck supple 95 and a left of 0.73 with a high-frequency signal in his left SFA though it is hard to elicit a clear history of claudication. Venous Dopplers to suggest venous reflux on the left. 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 He is scheduled for left total knee replacement in January.  He denies chest pain but has chronic dyspnea on exertion. He had a right total knee replacement by Dr. Reynaldo Minium in January. Prior to that he had a 2-D echo which was normal and Myoview stress test that showed very mild lateral ischemia which I thought was relatively low risk. He had a somewhat protracted postoperative course with hypertensive urgency and volume overload. He was kept in place after that. He has now ambulatory and relatively asymptomatic.   He was admitted to [facility] 04/11/17 with volume overload and diastolic heart failure, and again in 2019.  He does see Dr. Haroldine Laws in the advanced heart failure clinic.[name] He was diuresed. His diuretics were adjusted. Does admit to dietary indiscretion with regards to  salt. 2-D echo performed 11/13/16 revealed normal LV systolic function with moderate LVH and moderate aortic stenosis.   He was seen by Dr. Stanford Breed 03/26/2021 and was found to be in A. fib.  He was placed on Eliquis oral anticoagulation and underwent successful DC cardioversion on 05/07/2021 by Dr.Chandrasekhar.  He does feel somewhat clinically improved.  He did have an echo performed 01/30/2021 that showed vigorous LV function with moderate aortic stenosis.  He is on torsemide as needed and spironolactone for lower extreme edema.     Current Meds  Medication Sig   acetaminophen (TYLENOL) 500 MG tablet Take 1,000 mg by mouth in the morning and at bedtime.   apixaban (ELIQUIS) 5 MG TABS tablet Take 1 tablet (5 mg total) by mouth 2 (two) times daily.   atorvastatin (LIPITOR) 40 MG tablet Take 40 mg by mouth every evening.   carvedilol (COREG) 12.5 MG tablet Take 12.5 mg by mouth 2 (two) times daily with a meal.   cetirizine (ZYRTEC) 10 MG tablet Take 10 mg by mouth daily.   diclofenac Sodium (VOLTAREN) 1 % GEL Apply 1 application topically 4 (four) times daily as needed (pain).   folic acid (FOLVITE) 1 MG tablet Take 1 mg by mouth daily.   hydrALAZINE (APRESOLINE) 100 MG tablet TAKE 1 TABLET BY MOUTH TWICE A DAY   hydrOXYzine (ATARAX/VISTARIL) 25 MG tablet Take 25 mg by mouth every 8 (eight) hours as needed for itching.   LEVEMIR FLEXTOUCH 100 UNIT/ML Pen Inject 20 Units into the skin at bedtime.   lisinopril (PRINIVIL,ZESTRIL) 40 MG  tablet Take 40 mg by mouth daily.   Multiple Vitamin (MULTIVITAMIN WITH MINERALS) TABS tablet Take 1 tablet by mouth daily.   PROAIR HFA 108 (90 Base) MCG/ACT inhaler Inhale 1-2 puffs into the lungs every 6 (six) hours as needed for wheezing or shortness of breath.   spironolactone (ALDACTONE) 25 MG tablet Take 1 tablet (25 mg total) by mouth daily.   tamsulosin (FLOMAX) 0.4 MG CAPS capsule Take 0.4 mg by mouth daily.   TIADYLT ER 360 MG 24 hr capsule Take 360 mg by  mouth daily.    tolterodine (DETROL LA) 4 MG 24 hr capsule Take 4 mg by mouth daily.   torsemide (DEMADEX) 20 MG tablet Take 1 tablet (20 mg total) by mouth daily. (Patient taking differently: Take 20 mg by mouth daily as needed (edema).)   triamcinolone cream (KENALOG) 0.1 % Apply 1 application topically daily as needed (irritation).      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  Tobacco Use   Smoking status: Former    Packs/day: 1.00    Years: 35.00    Pack years: 35.00    Types: Cigarettes    Quit date: 07/25/2005    Years since quitting: 15.8   Smokeless tobacco: Never  Vaping Use   Vaping Use: Never used  Substance and Sexual Activity   Alcohol use: Yes    Alcohol/week: 0.5 standard drinks    Types: 1 Standard drinks or equivalent per week    Comment: rarely    Drug use: No   Sexual activity: Not on file  Other Topics Concern   Not on file  Social History Narrative   Not on file   Social Determinants of Health   Financial Resource Strain: Not on file  Food Insecurity: Not on file  Transportation Needs: Not on file  Physical Activity: Not on file  Stress: Not on file  Social Connections: Not on file  Intimate Partner Violence: Not on file     Review of Systems: General: negative for chills, fever, night sweats or weight changes.  Cardiovascular: negative for chest pain, dyspnea on exertion, edema, orthopnea, palpitations, paroxysmal nocturnal dyspnea or shortness of breath Dermatological: negative for rash Respiratory: negative for cough or wheezing Urologic: negative for hematuria Abdominal: negative for nausea, vomiting, diarrhea, bright red blood per rectum, melena, or hematemesis Neurologic:  negative for visual changes, syncope, or dizziness All other systems reviewed and are otherwise negative except as noted above.    Blood pressure (!) 121/58, pulse 60, height 6' (1.829 m), weight 297 lb (134.7 kg), SpO2 98 %.  General appearance: alert and no distress Neck: no adenopathy, no carotid bruit, no JVD, supple, symmetrical, trachea midline, and thyroid not enlarged, symmetric, no tenderness/mass/nodules Lungs: clear to auscultation bilaterally Heart: 2/6 outflow tract murmur consistent with aortic stenosis. Extremities: extremities normal, atraumatic, no cyanosis or edema Pulses: 2+ and symmetric Skin: Skin color, texture, turgor normal. No rashes or lesions Neurologic: Grossly normal  EKG sinus rhythm at 60 with septal Q waves.  I personally reviewed this EKG.  ASSESSMENT AND PLAN:   Paroxysmal atrial fibrillation [facility]) Mr. Coster returns today after being seen 6 weeks ago for PAF.  He was placed on Eliquis and underwent successful outpatient DC cardioversion by Dr Gasper Sells on 05/07/2021.  He remains in sinus rhythm today.  He feels mildly clinically improved.     Lorretta Harp MD FACP,FACC,FAHA, [facility] 06/10/2021 11:32 AM

## 2021-06-10 NOTE — Assessment & Plan Note (Signed)
Mr. Mario Mills returns today after being seen 6 weeks ago for PAF.  He was placed on Eliquis and underwent successful outpatient DC cardioversion by Dr Gasper Sells on 05/07/2021.  He remains in sinus rhythm today.  He feels mildly clinically improved.

## 2021-06-10 NOTE — Patient Instructions (Addendum)
Medication Instructions:  Your physician recommends that you continue on your current medications as directed. Please refer to the Current Medication list given to you today.  *If you need a refill on your cardiac medications before your next appointment, please call your pharmacy*   Testing/Procedures: Your physician has requested that you have an echocardiogram. Echocardiography is a painless test that uses sound waves to create images of your heart. It provides your doctor with information about the size and shape of your heart and how well your hearts chambers and valves are working. This procedure takes approximately one hour. There are no restrictions for this procedure. To be done in Sept 2023. This procedure is done at [address].    Follow-Up: At [facility], you and your health needs are our priority.  As part of our continuing mission to provide you with exceptional heart care, we have created designated Provider Care Teams.  These Care Teams include your primary Cardiologist (physician) and Advanced Practice Providers (APPs -  Physician Assistants and Nurse Practitioners) who all work together to provide you with the care you need, when you need it.  We recommend signing up for the patient portal called "MyChart".  Sign up information is provided on this After Visit Summary.  MyChart is used to connect with patients for Virtual Visits (Telemedicine).  Patients are able to view lab/test results, encounter notes, upcoming appointments, etc.  Non-urgent messages can be sent to your provider as well.   To learn more about what you can do with MyChart, go to [email or web address].    Your next appointment:   6 month(s)  The format for your next appointment:   In Person  Provider:   Coletta Memos, FNP, Fabian Sharp, PA-C, Sande Rives, PA-C, Caron Presume, PA-C, Jory Sims, DNP, ANP, or Almyra Deforest, PA-C      Then, Quay Burow, MD will plan to see you again in  12 month(s).

## 2021-06-24 ENCOUNTER — Ambulatory Visit: Payer: Medicare HMO | Admitting: Cardiovascular Disease

## 2021-07-16 ENCOUNTER — Ambulatory Visit: Payer: Medicare HMO | Admitting: Cardiovascular Disease

## 2021-09-04 ENCOUNTER — Other Ambulatory Visit: Payer: Self-pay | Admitting: Internal Medicine

## 2021-09-05 LAB — COMPLETE METABOLIC PANEL WITH GFR
AG Ratio: 1.1 (calc) (ref 1.0–2.5)
ALT: 15 U/L (ref 9–46)
AST: 16 U/L (ref 10–35)
Albumin: 3.5 g/dL — ABNORMAL LOW (ref 3.6–5.1)
Alkaline phosphatase (APISO): 66 U/L (ref 35–144)
BUN/Creatinine Ratio: 20 (calc) (ref 6–22)
BUN: 34 mg/dL — ABNORMAL HIGH (ref 7–25)
CO2: 24 mmol/L (ref 20–32)
Calcium: 8.9 mg/dL (ref 8.6–10.3)
Chloride: 104 mmol/L (ref 98–110)
Creat: 1.71 mg/dL — ABNORMAL HIGH (ref 0.70–1.28)
Globulin: 3.3 g/dL (calc) (ref 1.9–3.7)
Glucose, Bld: 162 mg/dL — ABNORMAL HIGH (ref 65–99)
Potassium: 4.7 mmol/L (ref 3.5–5.3)
Sodium: 137 mmol/L (ref 135–146)
Total Bilirubin: 0.3 mg/dL (ref 0.2–1.2)
Total Protein: 6.8 g/dL (ref 6.1–8.1)
eGFR: 42 mL/min/{1.73_m2} — ABNORMAL LOW (ref >=60)

## 2021-09-05 LAB — CBC
HCT: 32.4 % — ABNORMAL LOW (ref 38.5–50.0)
Hemoglobin: 10.6 g/dL — ABNORMAL LOW (ref 13.2–17.1)
MCH: 30.5 pg (ref 27.0–33.0)
MCHC: 32.7 g/dL (ref 32.0–36.0)
MCV: 93.1 fL (ref 80.0–100.0)
MPV: 11.1 fL (ref 7.5–12.5)
Platelets: 291 10*3/uL (ref 140–400)
RBC: 3.48 10*6/uL — ABNORMAL LOW (ref 4.20–5.80)
RDW: 13 % (ref 11.0–15.0)
WBC: 8.5 10*3/uL (ref 3.8–10.8)

## 2021-09-05 LAB — LIPID PANEL
Cholesterol: 178 mg/dL (ref <200)
HDL: 38 mg/dL — ABNORMAL LOW (ref >=40)
LDL Cholesterol (Calc): 115 mg/dL (calc) — ABNORMAL HIGH
Non-HDL Cholesterol (Calc): 140 mg/dL (calc) — ABNORMAL HIGH (ref <130)
Total CHOL/HDL Ratio: 4.7 (calc) (ref <5.0)
Triglycerides: 142 mg/dL (ref <150)

## 2021-09-05 LAB — TSH: TSH: 1 mIU/L (ref 0.40–4.50)

## 2021-10-20 NOTE — Progress Notes (Unsigned)
Cardiology Clinic Note   Patient Name: Mario Mills Date of Encounter: 10/21/2021  Primary Care Provider:  Nolene Ebbs, MD Primary Cardiologist:  Quay Burow, MD  Patient Profile    71 year old male with history of hypertension, hyperlipidemia, chronic diastolic heart failure, moderate aortic valve stenosis per echo in 2018, OSA on CPAP, non-insulin-dependent diabetes.  Additionally he has peripheral vascular disease  Arterial and venous Dopplers were obtained our office on 12/16/12 revealing a right ankle regular neck supple 95 and a left of 0.73 with a high-frequency signal in his left SFA though it is hard to elicit a clear history of claudication. Venous Dopplers to suggest venous reflux on the left. 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  He was diagnosed with atrial fibrillation on 03/26/2021,and placed on Eliquis. He underwent a  successful DCCV on 05/07/2021 by Dr. Gasper Sells.  When seen last by Dr. Gwenlyn Found on 06/10/2021 he remained in normal sinus rhythm, and on anticoagulation therapy.  CHA2DS2-VASc score 4 (HTN, Age, CHF, Diabetes).   Past Medical History    Past Medical History:  Diagnosis Date   Arthritis    CHF (congestive heart failure) (HCC)    Chronic diastolic heart failure ([place])    a. 10/2016 Echo: EF 55-60%, gr2 DD, triv AI, mod dil LA, mildly dil RV/RA.   Chronic low back pain with sciatica    Constipation    DM (diabetes mellitus) type II controlled peripheral vascular disorder    GERD (gastroesophageal reflux disease)    History of stress test    a. 04/2015 MV: EF 47%, mild basal inflat and mid inflat defect c/w ischemia. EF nl by echo @ that time and no further isch w/u pursued.   Hyperlipidemia    Hypertension    Hypokalemia    Microcytic anemia    Moderate aortic stenosis    a. 10/2016 Echo: Mod AS.   Numbness and tingling    feet bilat    Peripheral arterial disease (HCC)    Primary osteoarthritis of left knee    Sleep apnea     Urinary incontinence    Venous insufficiency    a. chronic LEE-->wound mgmt/wraps   Venous stasis ulcer ([place])    Past Surgical History:  Procedure Laterality Date   BIOPSY  09/28/2019   Procedure: BIOPSY;  Surgeon: Milus Banister, MD;  Location: [facility];  Service: Endoscopy;;   CARDIOVERSION N/A 05/07/2021   Procedure: CARDIOVERSION;  Surgeon: Werner Lean, MD;  Location: [facility];  Service: Cardiovascular;  Laterality: N/A;   COLON RESECTION  2007   COLON SURGERY     COLONOSCOPY WITH PROPOFOL N/A 09/28/2019   Procedure: COLONOSCOPY WITH PROPOFOL;  Surgeon: Milus Banister, MD;  Location: [facility];  Service: Endoscopy;  Laterality: N/A;   ESOPHAGOGASTRODUODENOSCOPY (EGD) WITH PROPOFOL N/A 09/28/2019   Procedure: ESOPHAGOGASTRODUODENOSCOPY (EGD) WITH PROPOFOL;  Surgeon: Milus Banister, MD;  Location: [facility];  Service: Endoscopy;  Laterality: N/A;   POLYPECTOMY  09/28/2019   Procedure: POLYPECTOMY;  Surgeon: Milus Banister, MD;  Location: [facility];  Service: Endoscopy;;   TONSILLECTOMY     TOTAL KNEE ARTHROPLASTY Left 05/31/2015   Procedure: TOTAL LEFT  KNEE ARTHROPLASTY;  Surgeon: Gaynelle Arabian, MD;  Location: [facility];  Service: Orthopedics;  Laterality: Left;   TRANSTHORACIC ECHOCARDIOGRAM  07/02/2005   mod concentric LVH; LV borderline dilated; LA mild-mod dilated; mild MR & TR   VARICOSE VEIN SURGERY     R leg  Allergies  No Known Allergies  History of Present Illness    Mr. Schmieder presents today for ongoing assessment and management of PAF, hypertension, chronic diastolic heart failure, peripheral vascular disease, and hyperlipidemia.  He remains on apixaban 5 mg twice daily with a CHA2DS2-VASc score of 4.  He comes today without any cardiac complaints.  He denies any significant shortness of breath, dizziness, or chest discomfort.  He uses a cane with help concerning ambulation.  He is fairly sedentary.  He is complaining that his blood  pressures been a little low lately but denies any syncope or presyncope associated with this.  He is medically compliant and denies volume overload on current medication regimen.  He is trying to continue a low-sodium diet.  He admits to drinking a lot of water.  Home Medications    Current Outpatient Medications  Medication Sig Dispense Refill   acetaminophen (TYLENOL) 500 MG tablet Take 1,000 mg by mouth in the morning and at bedtime.     apixaban (ELIQUIS) 5 MG TABS tablet Take 1 tablet (5 mg total) by mouth 2 (two) times daily. 60 tablet 6   atorvastatin (LIPITOR) 40 MG tablet Take 40 mg by mouth every evening.  2   carvedilol (COREG) 12.5 MG tablet Take 12.5 mg by mouth 2 (two) times daily with a meal.     cetirizine (ZYRTEC) 10 MG tablet Take 10 mg by mouth daily.     diclofenac (VOLTAREN) 75 MG EC tablet Take 1 tablet by mouth daily as needed.     diclofenac Sodium (VOLTAREN) 1 % GEL Apply 1 application topically 4 (four) times daily as needed (pain).     folic acid (FOLVITE) 1 MG tablet Take 1 mg by mouth daily.     hydrALAZINE (APRESOLINE) 100 MG tablet TAKE 1 TABLET BY MOUTH TWICE A DAY 180 tablet 3   hydrOXYzine (ATARAX/VISTARIL) 25 MG tablet Take 25 mg by mouth every 8 (eight) hours as needed for itching.     insulin detemir (LEVEMIR FLEXTOUCH) 100 UNIT/ML FlexPen Inject 20 Units into the skin at bedtime.     LEVEMIR FLEXTOUCH 100 UNIT/ML Pen Inject 20 Units into the skin at bedtime.  5   lisinopril (PRINIVIL,ZESTRIL) 40 MG tablet Take 40 mg by mouth daily.  2   Multiple Vitamin (MULTIVITAMIN WITH MINERALS) TABS tablet Take 1 tablet by mouth daily.     MYRBETRIQ 50 MG TB24 tablet Take 1 tablet by mouth daily.     PROAIR HFA 108 (90 Base) MCG/ACT inhaler Inhale 1-2 puffs into the lungs every 6 (six) hours as needed for wheezing or shortness of breath.     spironolactone (ALDACTONE) 25 MG tablet Take 1 tablet (25 mg total) by mouth daily. 30 tablet 11   tamsulosin (FLOMAX) 0.4 MG  CAPS capsule Take 0.4 mg by mouth daily.     TIADYLT ER 360 MG 24 hr capsule Take 360 mg by mouth daily.      torsemide (DEMADEX) 20 MG tablet Take 1 tablet (20 mg total) by mouth daily. (Patient taking differently: Take 20 mg by mouth daily as needed (edema).)     triamcinolone cream (KENALOG) 0.1 % Apply 1 application topically daily as needed (irritation).   1   No current facility-administered medications for this visit.     Family History    Family History  Problem Relation Age of Onset   Pancreatic cancer Father    Heart Problems Mother    Hypertension Mother  Diabetes Brother    Hypertension Brother    Colon cancer Neg Hx    He indicated that his mother is deceased. He indicated that his father is deceased. He indicated that the status of his brother is unknown. He indicated that the status of his neg hx is unknown.  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  Tobacco Use   Smoking status: Former    Packs/day: 1.00    Years: 35.00    Pack years: 35.00    Types: Cigarettes    Quit date: 07/25/2005    Years since quitting: 16.2   Smokeless tobacco: Never  Vaping Use   Vaping Use: Never used  Substance and Sexual Activity   Alcohol use: Yes    Alcohol/week: 0.5 standard drinks    Types: 1 Standard drinks or equivalent per week    Comment: rarely    Drug use: No   Sexual activity: Not on file  Other Topics Concern   Not on file  Social History Narrative   Not on file   Social Determinants of Health   Financial Resource Strain: Not on file  Food Insecurity: Not on file  Transportation Needs: Not on file  Physical Activity: Not on file  Stress: Not on file  Social Connections: Not on file   Intimate Partner Violence: Not on file     Review of Systems    General:  No chills, fever, night sweats or weight changes.  Cardiovascular:  No chest pain, dyspnea on exertion, edema, orthopnea, palpitations, paroxysmal nocturnal dyspnea. Dermatological: No rash, lesions/masses Respiratory: No cough, dyspnea Urologic: No hematuria, dysuria Abdominal:   No nausea, vomiting, diarrhea, bright red blood per rectum, melena, or hematemesis Neurologic:  No visual changes, wkns, changes in mental status. All other systems reviewed and are otherwise negative except as noted above.     Physical Exam    VS:  BP (!) 100/50   Pulse (!) 59   Ht 6' (1.829 m)   Wt 288 lb (130.6 kg)   SpO2 96%   BMI 39.06 kg/m  , BMI Body mass index is 39.06 kg/m.     GEN: Well nourished, well developed, in no acute distress. HEENT: normal. Neck: Supple, no JVD, carotid bruits, or masses. Cardiac: RRR, high-pitched systolic murmurs, radiating to the carotids, no rubs, or gallops. No clubbing, cyanosis, edema.  Radials/DP/PT 2+ and equal bilaterally.  Respiratory:  Respirations regular and unlabored, clear to auscultation bilaterally. GI: Soft, nontender, nondistended, BS + x 4. MS: no deformity or atrophy.  Has compression device wraps bilaterally lower extremities Skin: warm and dry, no rash. Neuro:  Strength and sensation are intact. Psych: Normal affect.  Accessory Clinical Findings    ECG personally reviewed by me today-sinus bradycardia with nonspecific T wave abnormality, T wave inversion in V45 and 6.  Heart rate of 56 bpm- No acute changes  Lab Results  Component Value Date   WBC 8.5 09/04/2021   HGB 10.6 (L) 09/04/2021   HCT 32.4 (L) 09/04/2021   MCV 93.1 09/04/2021   PLT 291 09/04/2021   Lab Results  Component Value Date   CREATININE 1.71 (H) 09/04/2021   BUN 34 (  H) 09/04/2021   NA 137 09/04/2021   K 4.7 09/04/2021   CL 104 09/04/2021   CO2 24 09/04/2021   Lab Results   Component Value Date   ALT 15 09/04/2021   AST 16 09/04/2021   ALKPHOS 79 05/23/2017   BILITOT 0.3 09/04/2021   Lab Results  Component Value Date   CHOL 178 09/04/2021   HDL 38 (L) 09/04/2021   LDLCALC 115 (H) 09/04/2021   TRIG 142 09/04/2021   CHOLHDL 4.7 09/04/2021    Lab Results  Component Value Date   HGBA1C 6.8 (H) 05/24/2015    Review of Prior Studies: Echocardiogram 01/30/2021  1. Left ventricular ejection fraction, by estimation, is 70 to 75%. The  left ventricle has hyperdynamic function. The left ventricle has no  regional wall motion abnormalities. There is moderate concentric left  ventricular hypertrophy. Left ventricular  diastolic function could not be evaluated.   2. Right ventricular systolic function is normal. The right ventricular  size is normal. Tricuspid regurgitation signal is inadequate for assessing  PA pressure.   3. Left atrial size was severely dilated.   4. Right atrial size was severely dilated.   5. The mitral valve is normal in structure. No evidence of mitral valve  regurgitation.   6. The aortic valve has an indeterminant number of cusps. There is severe  calcifcation of the aortic valve. There is severe thickening of the aortic  valve. Aortic valve regurgitation is trivial. Moderate aortic valve  stenosis. Aortic valve mean gradient   measures 20.5 mmHg. Aortic valve Vmax measures 3.19 m/s.   7. The inferior vena cava is dilated in size with >50% respiratory  variability, suggesting right atrial pressure of 8 mmHg  Assessment & Plan   1.  Aortic valve stenosis: We will repeat echocardiogram for evaluation of aortic valve stenosis progression, and diastolic function.  He is currently asymptomatic but is not very active.  Continue medical management at this time with blood pressure control and heart rate control.  2.  Chronic diastolic CHF: No evidence of volume overload today.  Weight has been stable, in fact it is down about 8 pounds  since last office visit.  He is doing his best to avoid salty foods and is medically compliant.  No changes to his current regimen.  3.  Hypertension: Blood pressure soft today.  He is on hydralazine 100 mg twice daily.  Can consider decreasing this dose to 75 mg twice daily but he wishes to continue the medication as is as he is not having significant symptoms.  He is not very active.  He denies any orthostatic symptoms.  We will follow closely.  4.  Paroxysmal atrial fibrillation: Remains in normal sinus rhythm currently.  He remains on Eliquis 5 mg twice daily.  No evidence of bleeding or complaints of hemoptysis or melena.  No changes at this time.  Can consider taking him off anticoagulation on future visits as he has remained in sinus rhythm without any paroxysms that he can report.  Current medicines are reviewed at length with the patient today.  I have spent 25 min's  dedicated to the care of this patient on the date of this encounter to include pre-visit review of records, assessment, management and diagnostic testing,with shared decision making. Signed, Phill Myron. West Pugh, ANP, AACC   10/21/2021 1:43 PM    [address] Office [phone] Fax [phone]  Notice: This dictation was prepared with  Dragon dictation along with smaller Company secretary. Any transcriptional errors that result from this process are unintentional and may not be corrected upon review.

## 2021-10-21 ENCOUNTER — Ambulatory Visit (INDEPENDENT_AMBULATORY_CARE_PROVIDER_SITE_OTHER): Payer: Medicare PPO | Admitting: Adult Health

## 2021-10-21 ENCOUNTER — Encounter: Payer: Self-pay | Admitting: Adult Health

## 2021-10-21 VITALS — BP 100/50 | HR 59 | Ht 72.0 in | Wt 288.0 lb

## 2021-10-21 DIAGNOSIS — I10 Essential (primary) hypertension: Secondary | ICD-10-CM | POA: Diagnosis not present

## 2021-10-21 DIAGNOSIS — I480 Paroxysmal atrial fibrillation: Secondary | ICD-10-CM

## 2021-10-21 DIAGNOSIS — I350 Nonrheumatic aortic (valve) stenosis: Secondary | ICD-10-CM

## 2021-10-21 DIAGNOSIS — I5032 Chronic diastolic (congestive) heart failure: Secondary | ICD-10-CM

## 2021-10-21 NOTE — Patient Instructions (Signed)
Medication Instructions:  No Changes *If you need a refill on your cardiac medications before your next appointment, please call your pharmacy*   Lab Work: None  If you have labs (blood work) drawn today and your tests are completely normal, you will receive your results only by: [place] (if you have MyChart) OR A paper copy in the mail If you have any lab test that is abnormal or we need to change your treatment, we will call you to review the results.   Testing/Procedures: [address], [address]. Your physician has requested that you have an echocardiogram. Echocardiography is a painless test that uses sound waves to create images of your heart. It provides your doctor with information about the size and shape of your heart and how well your heart's chambers and valves are working. This procedure takes approximately one hour. There are no restrictions for this procedure.    Follow-Up: At [facility], you and your health needs are our priority.  As part of our continuing mission to provide you with exceptional heart care, we have created designated Provider Care Teams.  These Care Teams include your primary Cardiologist (physician) and Advanced Practice Providers (APPs -  Physician Assistants and Nurse Practitioners) who all work together to provide you with the care you need, when you need it.  We recommend signing up for the patient portal called "MyChart".  Sign up information is provided on this After Visit Summary.  MyChart is used to connect with patients for Virtual Visits (Telemedicine).  Patients are able to view lab/test results, encounter notes, upcoming appointments, etc.  Non-urgent messages can be sent to your provider as well.   To learn more about what you can do with MyChart, go to [email or web address].    Your next appointment:   6 month(s)  The format for your next appointment:   In Person  Provider:   Quay Burow, MD        Important Information About Sugar

## 2021-11-07 ENCOUNTER — Other Ambulatory Visit (HOSPITAL_COMMUNITY): Payer: Medicare PPO

## 2021-11-19 ENCOUNTER — Other Ambulatory Visit: Payer: Self-pay | Admitting: Cardiology

## 2021-11-19 DIAGNOSIS — I4819 Other persistent atrial fibrillation: Secondary | ICD-10-CM

## 2021-11-19 DIAGNOSIS — R0602 Shortness of breath: Secondary | ICD-10-CM

## 2021-11-19 NOTE — Telephone Encounter (Signed)
Prescription refill request for Eliquis received. Indication: Atrial Fib Last office visit: 10/21/21  Arnold Long NP Scr: 1.71 on 09/04/21 Age: 71 Weight: 130.6kg  Based on above findings Eliquis '5mg'$  twice daily is the appropriate dose.  Refill approved.

## 2022-01-06 ENCOUNTER — Other Ambulatory Visit: Payer: Self-pay | Admitting: Internal Medicine

## 2022-01-07 LAB — BASIC METABOLIC PANEL WITH GFR
BUN/Creatinine Ratio: 20 (calc) (ref 6–22)
BUN: 46 mg/dL — ABNORMAL HIGH (ref 7–25)
CO2: 18 mmol/L — ABNORMAL LOW (ref 20–32)
Calcium: 8.5 mg/dL — ABNORMAL LOW (ref 8.6–10.3)
Chloride: 111 mmol/L — ABNORMAL HIGH (ref 98–110)
Creat: 2.32 mg/dL — ABNORMAL HIGH (ref 0.70–1.28)
Glucose, Bld: 168 mg/dL — ABNORMAL HIGH (ref 65–99)
Potassium: 5.5 mmol/L — ABNORMAL HIGH (ref 3.5–5.3)
Sodium: 139 mmol/L (ref 135–146)
eGFR: 29 mL/min/{1.73_m2} — ABNORMAL LOW (ref >=60)

## 2022-01-07 LAB — CBC
HCT: 20.6 % — ABNORMAL LOW (ref 38.5–50.0)
Hemoglobin: 6.5 g/dL — ABNORMAL LOW (ref 13.2–17.1)
MCH: 30.1 pg (ref 27.0–33.0)
MCHC: 31.6 g/dL — ABNORMAL LOW (ref 32.0–36.0)
MCV: 95.4 fL (ref 80.0–100.0)
MPV: 10.7 fL (ref 7.5–12.5)
Platelets: 297 10*3/uL (ref 140–400)
RBC: 2.16 10*6/uL — ABNORMAL LOW (ref 4.20–5.80)
RDW: 13.9 % (ref 11.0–15.0)
WBC: 9.7 10*3/uL (ref 3.8–10.8)

## 2022-01-22 ENCOUNTER — Other Ambulatory Visit: Payer: Self-pay | Admitting: Internal Medicine

## 2022-01-23 LAB — CBC
HCT: 25.1 % — ABNORMAL LOW (ref 38.5–50.0)
Hemoglobin: 7.6 g/dL — ABNORMAL LOW (ref 13.2–17.1)
MCH: 29.1 pg (ref 27.0–33.0)
MCHC: 30.3 g/dL — ABNORMAL LOW (ref 32.0–36.0)
MCV: 96.2 fL (ref 80.0–100.0)
MPV: 10.9 fL (ref 7.5–12.5)
Platelets: 301 10*3/uL (ref 140–400)
RBC: 2.61 10*6/uL — ABNORMAL LOW (ref 4.20–5.80)
RDW: 14 % (ref 11.0–15.0)
WBC: 9.8 10*3/uL (ref 3.8–10.8)

## 2022-01-23 LAB — BASIC METABOLIC PANEL WITH GFR
BUN/Creatinine Ratio: 20 (calc) (ref 6–22)
BUN: 47 mg/dL — ABNORMAL HIGH (ref 7–25)
CO2: 22 mmol/L (ref 20–32)
Calcium: 8.5 mg/dL — ABNORMAL LOW (ref 8.6–10.3)
Chloride: 111 mmol/L — ABNORMAL HIGH (ref 98–110)
Creat: 2.41 mg/dL — ABNORMAL HIGH (ref 0.70–1.28)
Glucose, Bld: 177 mg/dL — ABNORMAL HIGH (ref 65–99)
Potassium: 5.2 mmol/L (ref 3.5–5.3)
Sodium: 141 mmol/L (ref 135–146)
eGFR: 28 mL/min/{1.73_m2} — ABNORMAL LOW (ref >=60)

## 2022-04-28 ENCOUNTER — Other Ambulatory Visit (HOSPITAL_COMMUNITY): Payer: Self-pay | Admitting: Cardiology

## 2022-05-01 ENCOUNTER — Other Ambulatory Visit (HOSPITAL_COMMUNITY): Payer: Medicare PPO

## 2022-05-05 ENCOUNTER — Ambulatory Visit: Payer: Medicare PPO | Attending: Cardiovascular Disease | Admitting: Cardiovascular Disease

## 2022-05-05 ENCOUNTER — Encounter (HOSPITAL_COMMUNITY): Payer: Self-pay | Admitting: Adult Health

## 2022-05-14 ENCOUNTER — Encounter: Payer: Self-pay | Admitting: Cardiovascular Disease

## 2022-08-16 ENCOUNTER — Emergency Department (HOSPITAL_COMMUNITY)
Admission: EM | Admit: 2022-08-16 | Discharge: 2022-08-17 | Disposition: A | Discharge status: Other Acute Inpatient Hospital | Payer: Medicare PPO | Attending: Emergency Medicine | Admitting: Emergency Medicine

## 2022-08-16 ENCOUNTER — Emergency Department (HOSPITAL_COMMUNITY): Payer: Medicare PPO

## 2022-08-16 ENCOUNTER — Encounter (HOSPITAL_COMMUNITY): Payer: Self-pay

## 2022-08-16 ENCOUNTER — Other Ambulatory Visit: Payer: Self-pay

## 2022-08-16 DIAGNOSIS — I5032 Chronic diastolic (congestive) heart failure: Secondary | ICD-10-CM | POA: Diagnosis not present

## 2022-08-16 DIAGNOSIS — J9601 Acute respiratory failure with hypoxia: Secondary | ICD-10-CM | POA: Diagnosis not present

## 2022-08-16 DIAGNOSIS — U071 COVID-19: Secondary | ICD-10-CM | POA: Insufficient documentation

## 2022-08-16 DIAGNOSIS — E1151 Type 2 diabetes mellitus with diabetic peripheral angiopathy without gangrene: Secondary | ICD-10-CM | POA: Insufficient documentation

## 2022-08-16 DIAGNOSIS — I110 Hypertensive heart disease with heart failure: Secondary | ICD-10-CM | POA: Diagnosis not present

## 2022-08-16 DIAGNOSIS — R0602 Shortness of breath: Secondary | ICD-10-CM | POA: Diagnosis present

## 2022-08-16 LAB — CBC WITH DIFFERENTIAL/PLATELET
Abs Immature Granulocytes: 0.06 10*3/uL (ref 0.00–0.07)
Basophils Absolute: 0 10*3/uL (ref 0.0–0.1)
Basophils Relative: 0 %
Eosinophils Absolute: 0 10*3/uL (ref 0.0–0.5)
Eosinophils Relative: 0 %
HCT: 25.8 % — ABNORMAL LOW (ref 39.0–52.0)
Hemoglobin: 7.5 g/dL — ABNORMAL LOW (ref 13.0–17.0)
Immature Granulocytes: 1 %
Lymphocytes Relative: 6 %
Lymphs Abs: 0.7 10*3/uL (ref 0.7–4.0)
MCH: 26.9 pg (ref 26.0–34.0)
MCHC: 29.1 g/dL — ABNORMAL LOW (ref 30.0–36.0)
MCV: 92.5 fL (ref 80.0–100.0)
Monocytes Absolute: 0.7 10*3/uL (ref 0.1–1.0)
Monocytes Relative: 7 %
Neutro Abs: 9.9 10*3/uL — ABNORMAL HIGH (ref 1.7–7.7)
Neutrophils Relative %: 86 %
Platelets: 261 10*3/uL (ref 150–400)
RBC: 2.79 MIL/uL — ABNORMAL LOW (ref 4.22–5.81)
RDW: 19.4 % — ABNORMAL HIGH (ref 11.5–15.5)
WBC: 11.4 10*3/uL — ABNORMAL HIGH (ref 4.0–10.5)
nRBC: 0 % (ref 0.0–0.2)

## 2022-08-16 LAB — I-STAT VENOUS BLOOD GAS, ED
Acid-Base Excess: 10 mmol/L — ABNORMAL HIGH (ref 0.0–2.0)
Bicarbonate: 34.4 mmol/L — ABNORMAL HIGH (ref 20.0–28.0)
Calcium, Ion: 1.04 mmol/L — ABNORMAL LOW (ref 1.15–1.40)
HCT: 26 % — ABNORMAL LOW (ref 39.0–52.0)
Hemoglobin: 8.8 g/dL — ABNORMAL LOW (ref 13.0–17.0)
O2 Saturation: 89 %
Potassium: 4.2 mmol/L (ref 3.5–5.1)
Sodium: 142 mmol/L (ref 135–145)
TCO2: 36 mmol/L — ABNORMAL HIGH (ref 22–32)
pCO2, Ven: 45.2 mmHg (ref 44–60)
pH, Ven: 7.489 — ABNORMAL HIGH (ref 7.25–7.43)
pO2, Ven: 53 mmHg — ABNORMAL HIGH (ref 32–45)

## 2022-08-16 LAB — COMPREHENSIVE METABOLIC PANEL
ALT: 33 U/L (ref 0–44)
AST: 35 U/L (ref 15–41)
Albumin: 2.5 g/dL — ABNORMAL LOW (ref 3.5–5.0)
Alkaline Phosphatase: 69 U/L (ref 38–126)
Anion gap: 11 (ref 5–15)
BUN: 46 mg/dL — ABNORMAL HIGH (ref 8–23)
CO2: 29 mmol/L (ref 22–32)
Calcium: 8.3 mg/dL — ABNORMAL LOW (ref 8.9–10.3)
Chloride: 100 mmol/L (ref 98–111)
Creatinine, Ser: 2.4 mg/dL — ABNORMAL HIGH (ref 0.61–1.24)
GFR, Estimated: 28 mL/min — ABNORMAL LOW (ref >60)
Glucose, Bld: 194 mg/dL — ABNORMAL HIGH (ref 70–99)
Potassium: 4.4 mmol/L (ref 3.5–5.1)
Sodium: 140 mmol/L (ref 135–145)
Total Bilirubin: 1 mg/dL (ref 0.3–1.2)
Total Protein: 7.1 g/dL (ref 6.5–8.1)

## 2022-08-16 LAB — BRAIN NATRIURETIC PEPTIDE: B Natriuretic Peptide: 874.8 pg/mL — ABNORMAL HIGH (ref 0.0–100.0)

## 2022-08-16 LAB — SARS CORONAVIRUS 2 BY RT PCR: SARS Coronavirus 2 by RT PCR: POSITIVE — AB

## 2022-08-16 LAB — TROPONIN I (HIGH SENSITIVITY): Troponin I (High Sensitivity): 27 ng/L — ABNORMAL HIGH (ref <18)

## 2022-08-16 MED ORDER — SODIUM CHLORIDE 0.9 % IV SOLN
500.0000 mg | Freq: Once | INTRAVENOUS | Status: AC
  Administered 2022-08-16: 500 mg via INTRAVENOUS
  Filled 2022-08-16: qty 5

## 2022-08-16 MED ORDER — ACETAMINOPHEN 500 MG PO TABS
1000.0000 mg | ORAL_TABLET | Freq: Once | ORAL | Status: AC
  Administered 2022-08-16: 1000 mg via ORAL
  Filled 2022-08-16: qty 2

## 2022-08-16 MED ORDER — FUROSEMIDE 10 MG/ML IJ SOLN
60.0000 mg | Freq: Once | INTRAMUSCULAR | Status: AC
  Administered 2022-08-16: 60 mg via INTRAVENOUS
  Filled 2022-08-16: qty 6

## 2022-08-16 MED ORDER — SODIUM CHLORIDE 0.9 % IV SOLN
1.0000 g | Freq: Once | INTRAVENOUS | Status: AC
  Administered 2022-08-16: 1 g via INTRAVENOUS
  Filled 2022-08-16: qty 10

## 2022-08-16 NOTE — ED Triage Notes (Signed)
Pt BIB [facility] from home after calling for sobx 1 week, hx of CHF. Initial sats in the 80s, improved with CPAP to 95%. Patient also has pitting edema up to the groin, no CP. Initial pressure 180/100, improved to 140s with 1 dose nitro. HR 108. A&Ox4, currently nonambulatory.

## 2022-08-16 NOTE — ED Provider Notes (Signed)
Emergency Department Provider Note   I have reviewed the triage vital signs and the nursing notes.   HISTORY  Chief Complaint Respiratory Distress   HPI BLEU REICHEL is a 72 y.o. male with PMH of CHF, AS, DM, GERD, PAD presents to the ED for evaluation for SOB and hypoxemia. He has had symptoms for the last two days. He is compliant with his home medications.  He does not weigh himself daily and so is unsure about fluid retention.  No cough, congestion, or fever.  Denies any chest or abdominal pain.  Telemetry, EMS was called and arrived to find and hypoxic to the 80s.  He was started on CPAP and feels improved en route.   Past Medical History:  Diagnosis Date   Arthritis    CHF (congestive heart failure) (HCC)    Chronic diastolic heart failure ([place])    a. 10/2016 Echo: EF 55-60%, gr2 DD, triv AI, mod dil LA, mildly dil RV/RA.   Chronic low back pain with sciatica    Constipation    DM (diabetes mellitus) type II controlled peripheral vascular disorder    GERD (gastroesophageal reflux disease)    History of stress test    a. 04/2015 MV: EF 47%, mild basal inflat and mid inflat defect c/w ischemia. EF nl by echo @ that time and no further isch w/u pursued.   Hyperlipidemia    Hypertension    Hypokalemia    Microcytic anemia    Moderate aortic stenosis    a. 10/2016 Echo: Mod AS.   Numbness and tingling    feet bilat    Peripheral arterial disease (HCC)    Primary osteoarthritis of left knee    Sleep apnea    Urinary incontinence    Venous insufficiency    a. chronic LEE-->wound mgmt/wraps   Venous stasis ulcer ([place])     Review of Systems  Constitutional: No fever/chills Cardiovascular: Denies chest pain. Respiratory: Positive shortness of breath. Gastrointestinal: No abdominal pain.  No nausea, no vomiting.  Musculoskeletal: Negative for back pain. Skin: Negative for rash. Neurological: Negative for headaches, focal weakness or  numbness.  ____________________________________________   PHYSICAL EXAM:  VITAL SIGNS: Vitals:   08/16/22 2000 08/16/22 2015  BP:  139/89  Pulse:  79  Resp: (!) 21 14  Temp:    SpO2:  100%    Constitutional: Alert and oriented. Increased WOB.  Eyes: Conjunctivae are normal. Head: Atraumatic. Nose: No congestion/rhinnorhea. Mouth/Throat: Mucous membranes are moist.  Neck: No stridor. Cardiovascular: Normal rate, regular rhythm. Good peripheral circulation. Grossly normal heart sounds.   Respiratory: Positive respiratory effort.  No retractions. Lungs with crackles at the bases.  Gastrointestinal: Soft and nontender. No distention.  Musculoskeletal: No lower extremity tenderness with pitting edema in the LEs. No gross deformities of extremities. Neurologic:  Normal speech and language.  Skin:  Skin is warm, dry and intact. No rash noted.  ____________________________________________   LABS (all labs ordered are listed, but only abnormal results are displayed)  Labs Reviewed  SARS CORONAVIRUS 2 BY RT PCR - Abnormal; Notable for the following components:      Result Value   SARS Coronavirus 2 by RT PCR POSITIVE (*)    All other components within normal limits  COMPREHENSIVE METABOLIC PANEL - Abnormal; Notable for the following components:   Glucose, Bld 194 (*)    BUN 46 (*)    Creatinine, Ser 2.40 (*)    Calcium 8.3 (*)    Albumin  2.5 (*)    GFR, Estimated 28 (*)    All other components within normal limits  CBC WITH DIFFERENTIAL/PLATELET - Abnormal; Notable for the following components:   WBC 11.4 (*)    RBC 2.79 (*)    Hemoglobin 7.5 (*)    HCT 25.8 (*)    MCHC 29.1 (*)    RDW 19.4 (*)    Neutro Abs 9.9 (*)    All other components within normal limits  BRAIN NATRIURETIC PEPTIDE - Abnormal; Notable for the following components:   B Natriuretic Peptide 874.8 (*)    All other components within normal limits  I-STAT VENOUS BLOOD GAS, ED - Abnormal; Notable for  the following components:   pH, Ven 7.489 (*)    pO2, Ven 53 (*)    Bicarbonate 34.4 (*)    TCO2 36 (*)    Acid-Base Excess 10.0 (*)    Calcium, Ion 1.04 (*)    HCT 26.0 (*)    Hemoglobin 8.8 (*)    All other components within normal limits  TROPONIN I (HIGH SENSITIVITY) - Abnormal; Notable for the following components:   Troponin I (High Sensitivity) 27 (*)    All other components within normal limits  CULTURE, BLOOD (ROUTINE X 2)  CULTURE, BLOOD (ROUTINE X 2)  TROPONIN I (HIGH SENSITIVITY)   ____________________________________________  EKG   EKG Interpretation  Date/Time:  Sunday August 16 2022 18:49:37 EDT Ventricular Rate:  115 PR Interval:    QRS Duration: 128 QT Interval:  342 QTC Calculation: 430 R Axis:   136 Text Interpretation: Atrial fibrillation Ventricular bigeminy IVCD, consider atypical RBBB Consider anterolateral infarct Confirmed by Nanda Quinton [phone]) on 08/16/2022 7:00:18 PM        ____________________________________________  RADIOLOGY  DG Chest Portable 1 View  Result Date: 08/16/2022 CLINICAL DATA:  Shortness of breath EXAM: PORTABLE CHEST 1 VIEW COMPARISON:  Chest radiograph dated 05/23/2017 FINDINGS: Low lung volumes with bronchovascular crowding. Patchy and confluent bibasilar opacities. No pleural effusion or pneumothorax. The heart size and mediastinal contours are within normal limits. The visualized skeletal structures are unremarkable. IMPRESSION: Low lung volumes with bronchovascular crowding. Patchy and confluent bibasilar opacities, which could reflect atelectasis or pneumonia. Electronically Signed   By: Darrin Nipper M.D.   On: 08/16/2022 19:15    ____________________________________________   PROCEDURES  Procedure(s) performed:   Procedures  CRITICAL CARE Performed by: Margette Fast Total critical care time: 35 minutes Critical care time was exclusive of separately billable procedures and treating other patients. Critical care  was necessary to treat or prevent imminent or life-threatening deterioration. Critical care was time spent personally by me on the following activities: development of treatment plan with patient and/or surrogate as well as nursing, discussions with consultants, evaluation of patient's response to treatment, examination of patient, obtaining history from patient or surrogate, ordering and performing treatments and interventions, ordering and review of laboratory studies, ordering and review of radiographic studies, pulse oximetry and re-evaluation of patient's condition.  Nanda Quinton, MD Emergency Medicine  ____________________________________________   INITIAL IMPRESSION / ASSESSMENT AND PLAN / ED COURSE  Pertinent labs & imaging results that were available during my care of the patient were reviewed by me and considered in my medical decision making (see chart for details).   This patient is Presenting for Evaluation of SOB, which does require a range of treatment options, and is a complaint that involves a high risk of morbidity and mortality.  The Differential Diagnoses includes but is  not exclusive to acute coronary syndrome, aortic dissection, pulmonary embolism, cardiac tamponade, community-acquired pneumonia, pericarditis, musculoskeletal chest wall pain, etc.   Critical Interventions-    Medications  acetaminophen (TYLENOL) tablet 1,000 mg (1,000 mg Oral Given 08/16/22 2020)  cefTRIAXone (ROCEPHIN) 1 g in sodium chloride 0.9 % 100 mL IVPB (0 g Intravenous Stopped 08/16/22 2051)  azithromycin (ZITHROMAX) 500 mg in sodium chloride 0.9 % 250 mL IVPB (500 mg Intravenous New Bag/Given 08/16/22 2053)  furosemide (LASIX) injection 60 mg (60 mg Intravenous Given 08/16/22 2016)    Reassessment after intervention: Patient with improved respiratory status on BiPAP.    I did obtain Additional Historical Information from patient's wife.   I decided to review pertinent External Data, and in  summary patient followed primarily at [facility] with TAVR scheduled on 4/9.   Clinical Laboratory Tests Ordered, included BNP 874 with creatinine of 2.4. Troponin of 27. COVID positive.   Radiologic Tests Ordered, included CXR. I independently interpreted the images and agree with radiology interpretation.   Cardiac Monitor Tracing which shows NSR.    Social Determinants of Health Risk patient is not actively smoking.   Consult complete with ICU at [facility], Dr. Tama Gander. They will accept to the ICU.   Medical Decision Making: Summary:  Patient presents emergency department for evaluation of shortness of breath and hypoxemia.  Clinically seems fluid overloaded.  Up 48 pounds from his dry weight.     Reevaluation with update and discussion with patient and family. Patient scheduled for TAVR at [facility] on 4/9. His preference is to stay in that system if possible. Will reach out regarding transfer.   11:43 PM  Patient accepted to [place]. Dr. Tama Gander accepting.   Patient's presentation is most consistent with acute presentation with potential threat to life or bodily function.   Disposition: transfer  ____________________________________________  FINAL CLINICAL IMPRESSION(S) / ED DIAGNOSES  Final diagnoses:  Acute respiratory failure with hypoxia ([place])  COVID-19    Note:  This document was prepared using Dragon voice recognition software and may include unintentional dictation errors.  Nanda Quinton, MD, [facility] Emergency Medicine    Claira Jeter, W[facility], MD 08/19/22 [phone]

## 2022-08-16 NOTE — ED Notes (Signed)
Got patient undressed on the monitor did EKG shown to Dr Laverta Baltimore patient is resting with nurse at bedside and call bell in reach got patient some warm blankets

## 2022-08-17 LAB — TROPONIN I (HIGH SENSITIVITY): Troponin I (High Sensitivity): 33 ng/L — ABNORMAL HIGH (ref <18)

## 2022-08-17 NOTE — ED Notes (Signed)
Pt report called to [facility].

## 2022-08-17 NOTE — ED Notes (Signed)
Pt removed self from [place], arguing stating that he is not putting bipap back on and wants to go home and nothing is wrong for him to stay in the hospital. Pt educated on need to stay in hospital due to oxygen requirement. Pt states he was told he was going to another hospital and upset that wife took clothing and left. Pt refusing to put [place] on states that he will not put oxygen on until he gets water states that he has not had water in 2 days. Pt provided with water. RT at bedside, pt placed on [facility].

## 2022-08-17 NOTE — ED Notes (Signed)
[facility] transport set up

## 2022-08-17 NOTE — Progress Notes (Signed)
Changed NIV setting to NIV/PC to be able to set a rate. Pt was having increasing apnea periods. Pt still not breathing over set rate. Also changed patient from Medium mask to Large. Pt woke up and ask for water while I was changing his mask. Otherwise he has been asleep most of the night.  Mario Mills

## 2022-08-21 LAB — CULTURE, BLOOD (ROUTINE X 2)
Culture: NO GROWTH
Culture: NO GROWTH

## 2024-05-25 ENCOUNTER — Encounter (HOSPITAL_COMMUNITY): Payer: Self-pay

## 2024-05-25 ENCOUNTER — Other Ambulatory Visit: Payer: Self-pay

## 2024-05-25 ENCOUNTER — Emergency Department (HOSPITAL_COMMUNITY)
Admission: EM | Admit: 2024-05-25 | Discharge: 2024-05-25 | Disposition: A | Discharge status: Home / Self Care | Attending: Emergency Medicine | Admitting: Emergency Medicine

## 2024-05-25 DIAGNOSIS — Z7901 Long term (current) use of anticoagulants: Secondary | ICD-10-CM | POA: Diagnosis not present

## 2024-05-25 DIAGNOSIS — K644 Residual hemorrhoidal skin tags: Secondary | ICD-10-CM | POA: Diagnosis not present

## 2024-05-25 DIAGNOSIS — Z794 Long term (current) use of insulin: Secondary | ICD-10-CM | POA: Diagnosis not present

## 2024-05-25 DIAGNOSIS — I4891 Unspecified atrial fibrillation: Secondary | ICD-10-CM | POA: Diagnosis not present

## 2024-05-25 DIAGNOSIS — K5900 Constipation, unspecified: Secondary | ICD-10-CM | POA: Diagnosis not present

## 2024-05-25 DIAGNOSIS — K625 Hemorrhage of anus and rectum: Secondary | ICD-10-CM

## 2024-05-25 LAB — COMPREHENSIVE METABOLIC PANEL WITH GFR
ALT: 20 U/L (ref 0–44)
AST: 22 U/L (ref 15–41)
Albumin: 3.6 g/dL (ref 3.5–5.0)
Alkaline Phosphatase: 83 U/L (ref 38–126)
Anion gap: 10 (ref 5–15)
BUN: 43 mg/dL — ABNORMAL HIGH (ref 8–23)
CO2: 27 mmol/L (ref 22–32)
Calcium: 9.2 mg/dL (ref 8.9–10.3)
Chloride: 101 mmol/L (ref 98–111)
Creatinine, Ser: 2.95 mg/dL — ABNORMAL HIGH (ref 0.61–1.24)
GFR, Estimated: 22 mL/min — ABNORMAL LOW
Glucose, Bld: 217 mg/dL — ABNORMAL HIGH (ref 70–99)
Potassium: 4.8 mmol/L (ref 3.5–5.1)
Sodium: 138 mmol/L (ref 135–145)
Total Bilirubin: 0.4 mg/dL (ref 0.0–1.2)
Total Protein: 7.5 g/dL (ref 6.5–8.1)

## 2024-05-25 LAB — I-STAT CHEM 8, ED
BUN: 54 mg/dL — ABNORMAL HIGH (ref 8–23)
Calcium, Ion: 1.11 mmol/L — ABNORMAL LOW (ref 1.15–1.40)
Chloride: 100 mmol/L (ref 98–111)
Creatinine, Ser: 2.9 mg/dL — ABNORMAL HIGH (ref 0.61–1.24)
Glucose, Bld: 211 mg/dL — ABNORMAL HIGH (ref 70–99)
HCT: 49 % (ref 39.0–52.0)
Hemoglobin: 16.7 g/dL (ref 13.0–17.0)
Potassium: 5 mmol/L (ref 3.5–5.1)
Sodium: 139 mmol/L (ref 135–145)
TCO2: 28 mmol/L (ref 22–32)

## 2024-05-25 LAB — CBC WITH DIFFERENTIAL/PLATELET
Abs Immature Granulocytes: 0.02 K/uL (ref 0.00–0.07)
Basophils Absolute: 0 K/uL (ref 0.0–0.1)
Basophils Relative: 0 %
Eosinophils Absolute: 0.2 K/uL (ref 0.0–0.5)
Eosinophils Relative: 3 %
HCT: 48.9 % (ref 39.0–52.0)
Hemoglobin: 15.3 g/dL (ref 13.0–17.0)
Immature Granulocytes: 0 %
Lymphocytes Relative: 20 %
Lymphs Abs: 1.4 K/uL (ref 0.7–4.0)
MCH: 30.1 pg (ref 26.0–34.0)
MCHC: 31.3 g/dL (ref 30.0–36.0)
MCV: 96.1 fL (ref 80.0–100.0)
Monocytes Absolute: 0.4 K/uL (ref 0.1–1.0)
Monocytes Relative: 6 %
Neutro Abs: 4.6 K/uL (ref 1.7–7.7)
Neutrophils Relative %: 71 %
Platelets: 186 K/uL (ref 150–400)
RBC: 5.09 MIL/uL (ref 4.22–5.81)
RDW: 13.6 % (ref 11.5–15.5)
WBC: 6.7 K/uL (ref 4.0–10.5)
nRBC: 0 % (ref 0.0–0.2)

## 2024-05-25 LAB — TYPE AND SCREEN
ABO/RH(D): O POS
Antibody Screen: NEGATIVE

## 2024-05-25 LAB — PROTIME-INR
INR: 1.3 — ABNORMAL HIGH (ref 0.8–1.2)
Prothrombin Time: 16.5 s — ABNORMAL HIGH (ref 11.4–15.2)

## 2024-05-25 MED ORDER — HYDROCORTISONE (PERIANAL) 2.5 % EX CREA: 1.0000 | TOPICAL_CREAM | Freq: Two times a day (BID) | CUTANEOUS | 0 refills | Status: AC

## 2024-05-25 NOTE — Discharge Instructions (Addendum)
 You were evaluated for rectal bleeding and you were found to have an external hemorrhoid. See the above printed recommendations for hemorrhoid treatment (stool softeners, sitz bath, preparation H, etc). Call your GI (gastrointestinal) doctor.   Please also call the number provider for general surgery to evaluate for surgical intervention. Please return back to the ED if you have any fevers, chills, nausea, vomiting, or large amounts of bleeding that will not stop.

## 2024-05-25 NOTE — ED Triage Notes (Signed)
 Pt came in for rectal bleeding x1 week. Pt stated when he woke up this morning, the chuck pad he placed on his bed was soaked with blood. Pt also stated his underwear was soaked as well.

## 2024-05-25 NOTE — ED Provider Notes (Signed)
 "  EMERGENCY DEPARTMENT AT [facility] Provider Note   CSN: [id number] Arrival date & time: 05/25/24  1354     Patient presents with: Rectal Bleeding   Mario Mills is a 74 y.o. male.   Patient presented to the ED 1 week of rectal bleeding on Eliquis  BID.  Has past medical history of colon polyp seen by [facility].  Returns yearly for colonoscopy.  Patient stated that no polyps have returned cancerous.  Stated that 1 week ago he started having pretty severe constipation.  No change in diet, drinking adequate water.  States that when he would go and wait him to have rectal pain and slight bleeding.  The patient has had blood present on toilet paper: But 05/24/2024, patient reported on day of life.  He reports he placed a pad on his bed and he soaked through the pad.  He has no previous history of rectal bleeding that he is aware of.  He complains of sensation of burning/itching/feeling like something is back there.  Patient denies chest pain, shortness of breath.   Rectal Bleeding      Prior to Admission medications  Medication Sig Start Date End Date Taking? Authorizing Provider  acetaminophen  (TYLENOL ) 500 MG tablet Take 1,000 mg by mouth in the morning and at bedtime.    [provider]  amLODipine (NORVASC) 10 MG tablet Take 10 mg by mouth daily.    [provider]  ammonium lactate  (AMLACTIN) 12 % cream Apply 1 Application topically in the morning, at noon, in the evening, and at bedtime.    [provider]  apixaban  (ELIQUIS ) 5 MG TABS tablet TAKE 1 TABLET BY MOUTH TWICE A DAY Patient taking differently: Take 5 mg by mouth 2 (two) times daily. 11/19/21   Court Dorn PARAS, MD  atorvastatin  (LIPITOR) 40 MG tablet Take 40 mg by mouth at bedtime. 04/03/17   [provider]  carvedilol  (COREG ) 12.5 MG tablet Take 12.5 mg by mouth 2 (two) times daily with a meal.    [provider]  cetirizine (ZYRTEC) 10 MG tablet  Take 10 mg by mouth daily as needed for allergies.    [provider]  Chlorpheniramine-Acetaminophen  (CORICIDIN HBP COLD/FLU PO) Take 1 tablet by mouth in the morning, at noon, and at bedtime.    [provider]  diclofenac  (VOLTAREN ) 75 MG EC tablet Take 75 mg by mouth 2 (two) times daily. 10/04/21   [provider]  ferrous sulfate 325 (65 FE) MG tablet Take 325 mg by mouth in the morning and at bedtime.    [provider]  folic acid  (FOLVITE ) 1 MG tablet Take 1 mg by mouth daily. 02/12/17   [provider]  gabapentin  (NEURONTIN ) 100 MG capsule Take 100 mg by mouth 3 (three) times daily.    [provider]  hydrALAZINE  (APRESOLINE ) 100 MG tablet TAKE 1 TABLET BY MOUTH TWICE A DAY Patient taking differently: Take 100 mg by mouth 3 (three) times daily. 01/17/21   Court Dorn PARAS, MD  HYDROcodone-acetaminophen  (NORCO/VICODIN) 5-325 MG tablet Take 1 tablet by mouth 3 (three) times daily as needed for moderate pain or severe pain.    [provider]  hydrOXYzine  (ATARAX /VISTARIL ) 25 MG tablet Take 25 mg by mouth every 8 (eight) hours as needed for itching.    [provider]  insulin  detemir (LEVEMIR  FLEXTOUCH) 100 UNIT/ML FlexPen Inject 20 Units into the skin at bedtime. 01/18/17   [provider]  Multiple Vitamin (MULTIVITAMIN WITH MINERALS) TABS tablet Take 1 tablet by mouth daily. With Zinc    [provider]  MYRBETRIQ 50 MG TB24 tablet Take 50 mg by mouth daily. 10/17/21   [provider]  pantoprazole  (PROTONIX ) 40 MG tablet Take 40 mg by mouth 2 (two) times daily.    [provider]  PROAIR  HFA 108 (90 Base) MCG/ACT inhaler Inhale 1-2 puffs into the lungs every 6 (six) hours as needed for wheezing or shortness of breath. 04/02/17   [provider]  spironolactone  (ALDACTONE ) 25 MG tablet TAKE 1 TABLET (25 MG TOTAL) BY MOUTH DAILY. Patient not taking: Reported on 08/16/2022 04/29/22    [name], MD  tamsulosin  (FLOMAX ) 0.4 MG CAPS capsule Take 0.4 mg by mouth every evening.    [provider]  torsemide  (DEMADEX ) 20 MG tablet Take 1 tablet (20 mg total) by mouth daily. Patient taking differently: Take 20-40 mg by mouth See admin instructions. Take 2 tablets by mouth twice a day for 3 days, then take 1 tablet twice a day 04/01/21   [name], MD    Allergies: Patient has no known allergies.    Review of Systems  Gastrointestinal:  Positive for hematochezia.    Updated Vital Signs BP 139/80 (BP Location: Left Arm)   Pulse 85   Temp 97.7 F (36.5 C) (Oral)   Resp 20   SpO2 98%   Physical Exam Exam conducted with a chaperone present.  Constitutional:      General: He is not in acute distress.    Appearance: He is obese. He is not ill-appearing, toxic-appearing or diaphoretic.  Cardiovascular:     Rate and Rhythm: Normal rate and regular rhythm.     Heart sounds: Normal heart sounds. No murmur heard.    No friction rub. No gallop.  Pulmonary:     Effort: Pulmonary effort is normal.     Breath sounds: Normal breath sounds. No stridor. No wheezing, rhonchi or rales.  Abdominal:     General: Bowel sounds are normal.     Palpations: Abdomen is soft.     Tenderness: There is no abdominal tenderness. There is no guarding.  Genitourinary:    Comments: About 22 mm external hemorrhoid present. Small amount of frank red blood on bed sheet and with stool on rectal exam Musculoskeletal:     Right lower leg: No edema.     Left lower leg: No edema.  Skin:    General: Skin is warm and dry.  Neurological:     Mental Status: He is alert.     (all labs ordered are listed, but only abnormal results are displayed) Labs Reviewed  COMPREHENSIVE METABOLIC PANEL WITH GFR - Abnormal; Notable for the following components:      Result Value   Glucose, Bld 217 (*)    BUN 43 (*)    Creatinine, Ser 2.95 (*)    GFR, Estimated 22 (*)    All other  components within normal limits  PROTIME-INR - Abnormal; Notable for the following components:   Prothrombin Time 16.5 (*)    INR 1.3 (*)    All other components within normal limits  I-STAT CHEM 8, ED - Abnormal; Notable for the following components:   BUN 54 (*)    Creatinine, Ser 2.90 (*)    Glucose, Bld 211 (*)    Calcium , Ion 1.11 (*)    All other components within normal limits  CBC WITH DIFFERENTIAL/PLATELET  POC OCCULT BLOOD, ED  TYPE AND SCREEN    EKG: None  Radiology: No results found.   Procedures   Medications Ordered in the ED - No data to display                                  Medical Decision Making This patient is a 74 y.o. male who presents to the ED for concern of rectal bleeding, this involves an extensive number of treatment options, and is a complaint that carries with it a high risk of complications and morbidity. The emergent differential diagnosis prior to evaluation includes, but is not limited to, diverticulosis, diverticulitis, colon polyp, external hemorrhoid, internal hemorrhoid, AVM. This is not an exhaustive differential.   Past Medical History / Co-morbidities / Social History: History of polyps requiring yearly screening with GI A-fib on Eliquis   Physical Exam: Physical exam performed. The pertinent findings include:  Significant for 22 mm external hemorrhoid and bright red blood found in stool on digital rectal exam.  No tenderness to palpation of the abdomen.  Bowel sounds normal.  Lab Tests: I ordered, and personally interpreted labs.  The pertinent results include:   -CMP: Creatinine 2.95 (baseline around 2.4) -CBC with hemoglobin 15.3. -Pro time INR: PT 16.5, INR 1.3   Imaging Studies: Deferred at this time  EKG/Cardiac Monitoring: Deferred at this time   Medications: None  Consultations Obtained: None  MDM: Patient presents for rectal bleeding ongoing 1 week, with significant increase in bleeding on/11/2024.  Patient  stated that he has been having constipation ongoing for 1 week but is now passing stool.  Patient is on Eliquis  twice daily.  Hemoglobin 15.3.  Rectal exam showed 22 mm external hemorrhoid.  Differential diagnosis included diverticulitis, diverticulosis, AVM, however given rectal examination findings, it is most likely that patient is experiencing rectal bleeding 2/2 to external hemorrhoid.  Patient denied fever, chills, abdominal pain.  Return precautions discussed and patient was instructed to steroid cream, sitz bath, stool softeners, call general surgery for possible surgical intervention, and to follow-up with gastroenterology.  I discussed this case with my attending physician Dr. Dasie who cosigned this note including patient's presenting symptoms, physical exam, and planned diagnostics and interventions. Attending physician stated agreement with plan or made changes to plan which were implemented.      Risk Prescription drug management.       Final diagnoses:  None    ED Discharge Orders     None          Benuel Braun, DO 05/25/24 1709    Dasie Faden, MD 05/26/24 1024  "

## 2024-05-25 NOTE — ED Provider Notes (Signed)
 I saw and evaluated the patient, reviewed the resident's note and I agree with the findings and plan.   74 year old male presents with rectal bleeding.  On Eliquis .  Has evidence of external hemorrhoid.  Hemoglobin here is stable.  Chronic kidney disease noted.  Patient given follow-up instructions   Dasie Faden, MD 05/25/24 1556
